# Patient Record
Sex: Male | Born: 1960
Health system: Southern US, Community
[De-identification: ages and names within clinical notes are randomized; demographics above are authoritative.]

## PROBLEM LIST (undated history)

## (undated) DIAGNOSIS — C7A00 Malignant carcinoid tumor of unspecified site: Secondary | ICD-10-CM

## (undated) DIAGNOSIS — C7A012 Malignant carcinoid tumor of the ileum: Secondary | ICD-10-CM

## (undated) DIAGNOSIS — Z87442 Personal history of urinary calculi: Secondary | ICD-10-CM

## (undated) DIAGNOSIS — M1990 Unspecified osteoarthritis, unspecified site: Secondary | ICD-10-CM

## (undated) DIAGNOSIS — C7B02 Secondary carcinoid tumors of liver: Secondary | ICD-10-CM

## (undated) HISTORY — PX: LYMPH NODE BIOPSY: SHX201

## (undated) HISTORY — DX: Malignant carcinoid tumor of unspecified site: C7A.00

## (undated) HISTORY — DX: Personal history of urinary calculi: Z87.442

## (undated) HISTORY — PX: UMBILICAL HERNIA REPAIR: SHX196

## (undated) HISTORY — DX: Malignant carcinoid tumor of the ileum: C7A.012

## (undated) HISTORY — DX: Secondary carcinoid tumors of liver: C7B.02

---

## 1994-04-29 DIAGNOSIS — Z8547 Personal history of malignant neoplasm of testis: Secondary | ICD-10-CM

## 1994-04-29 HISTORY — PX: ORCHIECTOMY: SHX2116

## 1994-04-29 HISTORY — DX: Personal history of malignant neoplasm of testis: Z85.47

## 2015-08-14 DIAGNOSIS — M79671 Pain in right foot: Secondary | ICD-10-CM | POA: Insufficient documentation

## 2015-08-14 DIAGNOSIS — R600 Localized edema: Secondary | ICD-10-CM | POA: Insufficient documentation

## 2015-08-14 DIAGNOSIS — M25474 Effusion, right foot: Secondary | ICD-10-CM | POA: Insufficient documentation

## 2018-04-29 DIAGNOSIS — Z8616 Personal history of COVID-19: Secondary | ICD-10-CM

## 2018-04-29 HISTORY — DX: Personal history of COVID-19: Z86.16

## 2018-11-26 DIAGNOSIS — U071 COVID-19: Secondary | ICD-10-CM | POA: Insufficient documentation

## 2018-11-27 DIAGNOSIS — J9601 Acute respiratory failure with hypoxia: Secondary | ICD-10-CM | POA: Insufficient documentation

## 2018-12-26 DIAGNOSIS — I82401 Acute embolism and thrombosis of unspecified deep veins of right lower extremity: Secondary | ICD-10-CM | POA: Insufficient documentation

## 2020-01-13 DIAGNOSIS — K909 Intestinal malabsorption, unspecified: Secondary | ICD-10-CM | POA: Insufficient documentation

## 2020-01-13 DIAGNOSIS — R933 Abnormal findings on diagnostic imaging of other parts of digestive tract: Secondary | ICD-10-CM | POA: Insufficient documentation

## 2020-01-13 DIAGNOSIS — R197 Diarrhea, unspecified: Secondary | ICD-10-CM | POA: Insufficient documentation

## 2020-01-28 HISTORY — PX: CYSTOTOMY: SHX926

## 2020-02-08 DIAGNOSIS — I2584 Coronary atherosclerosis due to calcified coronary lesion: Secondary | ICD-10-CM | POA: Insufficient documentation

## 2020-02-08 DIAGNOSIS — I2510 Atherosclerotic heart disease of native coronary artery without angina pectoris: Secondary | ICD-10-CM | POA: Insufficient documentation

## 2020-02-11 ENCOUNTER — Other Ambulatory Visit: Payer: Self-pay | Admitting: Oncology

## 2020-02-11 ENCOUNTER — Other Ambulatory Visit: Payer: Self-pay | Admitting: Hematology and Oncology

## 2020-02-11 DIAGNOSIS — C7B8 Other secondary neuroendocrine tumors: Secondary | ICD-10-CM | POA: Insufficient documentation

## 2020-02-11 DIAGNOSIS — C7B02 Secondary carcinoid tumors of liver: Secondary | ICD-10-CM

## 2020-02-11 DIAGNOSIS — C7A098 Malignant carcinoid tumors of other sites: Secondary | ICD-10-CM

## 2020-02-11 DIAGNOSIS — C7A00 Malignant carcinoid tumor of unspecified site: Secondary | ICD-10-CM | POA: Insufficient documentation

## 2020-02-11 LAB — CBC: RBC: 5.08 (ref 3.87–5.11)

## 2020-02-11 LAB — BASIC METABOLIC PANEL
BUN: 19 (ref 4–21)
CO2: 20 (ref 13–22)
Chloride: 109 — AB (ref 99–108)
Creatinine: 1.5 — AB (ref 0.6–1.3)
Glucose: 97
Potassium: 4.2 (ref 3.4–5.3)
Sodium: 141 (ref 137–147)

## 2020-02-11 LAB — COMPREHENSIVE METABOLIC PANEL
Albumin: 4.4 (ref 3.5–5.0)
Calcium: 9 (ref 8.7–10.7)

## 2020-02-11 LAB — CBC AND DIFFERENTIAL
HCT: 44 (ref 41–53)
Hemoglobin: 14.6 (ref 13.5–17.5)
Neutrophils Absolute: 5470
Platelets: 150 (ref 150–399)
WBC: 7.7

## 2020-02-11 LAB — HEPATIC FUNCTION PANEL
ALT: 31 (ref 10–40)
AST: 29 (ref 14–40)
Alkaline Phosphatase: 65 (ref 25–125)
Bilirubin, Total: 0.8

## 2020-02-14 ENCOUNTER — Encounter: Payer: Self-pay | Admitting: Pharmacist

## 2020-02-21 ENCOUNTER — Other Ambulatory Visit: Payer: Self-pay

## 2020-02-21 ENCOUNTER — Inpatient Hospital Stay: Payer: 59 | Attending: Oncology

## 2020-02-21 VITALS — BP 141/82 | HR 63 | Temp 97.9°F | Resp 20 | Ht 70.0 in | Wt 249.5 lb

## 2020-02-21 DIAGNOSIS — C7A00 Malignant carcinoid tumor of unspecified site: Secondary | ICD-10-CM

## 2020-02-21 DIAGNOSIS — E340 Carcinoid syndrome: Secondary | ICD-10-CM | POA: Diagnosis present

## 2020-02-21 DIAGNOSIS — C7B02 Secondary carcinoid tumors of liver: Secondary | ICD-10-CM | POA: Diagnosis present

## 2020-02-21 DIAGNOSIS — C7B8 Other secondary neuroendocrine tumors: Secondary | ICD-10-CM

## 2020-02-21 MED ORDER — OCTREOTIDE ACETATE 20 MG IM KIT
PACK | INTRAMUSCULAR | Status: AC
  Filled 2020-02-21: qty 1

## 2020-02-21 MED ORDER — OCTREOTIDE ACETATE 20 MG IM KIT
20.0000 mg | PACK | Freq: Once | INTRAMUSCULAR | Status: AC
  Administered 2020-02-21: 20 mg via INTRAMUSCULAR

## 2020-02-21 NOTE — Progress Notes (Signed)
PT STABLE AT TIME OF DISCHARGE 

## 2020-02-21 NOTE — Patient Instructions (Signed)
Octreotide injection solution What is this medicine? OCTREOTIDE (ok TREE oh tide) is used to reduce blood levels of growth hormone in patients with a condition called acromegaly. This medicine also reduces flushing and watery diarrhea caused by certain types of cancer. This medicine may be used for other purposes; ask your health care provider or pharmacist if you have questions. COMMON BRAND NAME(S): Bynfezia, Sandostatin What should I tell my health care provider before I take this medicine? They need to know if you have any of these conditions:  diabetes  gallbladder disease  kidney disease  liver disease  thyroid disease  an unusual or allergic reaction to octreotide, other medicines, foods, dyes, or preservatives  pregnant or trying to get pregnant  breast-feeding How should I use this medicine? This medicine is for injection under the skin or into a vein (only in emergency situations). It is usually given by a health care professional in a hospital or clinic setting. If you get this medicine at home, you will be taught how to prepare and give this medicine. Allow the injection solution to come to room temperature before use. Do not warm it artificially. Use exactly as directed. Take your medicine at regular intervals. Do not take your medicine more often than directed. It is important that you put your used needles and syringes in a special sharps container. Do not put them in a trash can. If you do not have a sharps container, call your pharmacist or healthcare provider to get one. Talk to your pediatrician regarding the use of this medicine in children. Special care may be needed. Overdosage: If you think you have taken too much of this medicine contact a poison control center or emergency room at once. NOTE: This medicine is only for you. Do not share this medicine with others. What if I miss a dose? If you miss a dose, take it as soon as you can. If it is almost time for your  next dose, take only that dose. Do not take double or extra doses. What may interact with this medicine?  bromocriptine  certain medicines for blood pressure, heart disease, irregular heartbeat  cyclosporine  diuretics  medicines for diabetes, including insulin  quinidine This list may not describe all possible interactions. Give your health care provider a list of all the medicines, herbs, non-prescription drugs, or dietary supplements you use. Also tell them if you smoke, drink alcohol, or use illegal drugs. Some items may interact with your medicine. What should I watch for while using this medicine? Visit your doctor or health care professional for regular checks on your progress. To help reduce irritation at the injection site, use a different site for each injection and make sure the solution is at room temperature before use. This medicine may cause decreases in blood sugar. Signs of low blood sugar include chills, cool, pale skin or cold sweats, drowsiness, extreme hunger, fast heartbeat, headache, nausea, nervousness or anxiety, shakiness, trembling, unsteadiness, tiredness, or weakness. Contact your doctor or health care professional right away if you experience any of these symptoms. This medicine may increase blood sugar. Ask your healthcare provider if changes in diet or medicines are needed if you have diabetes. This medicine may cause a decrease in vitamin B12. You should make sure that you get enough vitamin B12 while you are taking this medicine. Discuss the foods you eat and the vitamins you take with your health care professional. What side effects may I notice from receiving this medicine? Side   effects that you should report to your doctor or health care professional as soon as possible:  allergic reactions like skin rash, itching or hives, swelling of the face, lips, or tongue  fast, slow, or irregular heartbeat  right upper belly pain  severe stomach pain  signs  and symptoms of high blood sugar such as being more thirsty or hungry or having to urinate more than normal. You may also feel very tired or have blurry vision.  signs and symptoms of low blood sugar such as feeling anxious; confusion; dizziness; increased hunger; unusually weak or tired; increased sweating; shakiness; cold, clammy skin; irritable; headache; blurred vision; fast heartbeat; loss of consciousness  unusually weak or tired Side effects that usually do not require medical attention (report to your doctor or health care professional if they continue or are bothersome):  diarrhea  dizziness  gas  headache  nausea, vomiting  pain, redness, or irritation at site where injected  upset stomach This list may not describe all possible side effects. Call your doctor for medical advice about side effects. You may report side effects to FDA at 1-800-FDA-1088. Where should I keep my medicine? Keep out of the reach of children. Store in a refrigerator between 2 and 8 degrees C (36 and 46 degrees F). Protect from light. Allow to come to room temperature naturally. Do not use artificial heat. If protected from light, the injection may be stored at room temperature between 20 and 30 degrees C (70 and 86 degrees F) for 14 days. After the initial use, throw away any unused portion of a multiple dose vial after 14 days. Throw away unused portions of the ampules after use. NOTE: This sheet is a summary. It may not cover all possible information. If you have questions about this medicine, talk to your doctor, pharmacist, or health care provider.  2020 Elsevier/Gold Standard (2018-11-12 13:33:09)  

## 2020-02-23 NOTE — Progress Notes (Signed)
PT STABLE AT TIME OF DISCHARGE 

## 2020-03-07 ENCOUNTER — Other Ambulatory Visit: Payer: Self-pay | Admitting: Hematology and Oncology

## 2020-03-07 DIAGNOSIS — C7A00 Malignant carcinoid tumor of unspecified site: Secondary | ICD-10-CM

## 2020-03-15 NOTE — Addendum Note (Signed)
Addended by: Juanetta Beets on: 03/15/2020 04:02 PM   Modules accepted: Orders

## 2020-03-16 NOTE — Progress Notes (Signed)
[facility]  [address] [place],  [place]  [address] [phone]  Clinic Day:  03/17/2020  Referring physician: Derwood Kaplan, MD   This document serves as a record of services personally performed by Hosie Poisson, MD. It was created on their behalf by Curry,Lauren E, a trained medical scribe. The creation of this record is based on the scribe's personal observations and the provider's statements to them.   CHIEF COMPLAINT:  CC: Metastatic carcinoid  Current Treatment:  Octreotide injections   HISTORY OF PRESENT ILLNESS:  Glenn Bender is a 59 y.o. male with metastatic carcinoid tumor diagnosed in September 2021.  The patient started to experience hematuria, and was felt to have a kidney stone, and so CT imaging was pursued.  CT imaging on September 8th which showed multiple liver masses, the largest measuring 4.0 x 4.3 cm, a terminal ileum mass measuring 4.0 x 2.8 x 3.7 cm, and a mass of the mesentery of the small intestine measuring 3.9 x 2.3 x 4.0 cm.  He was therefore scheduled for diagnostic colonoscopy with Dr. Coralie Keens on September 22nd which revealed a large mucosal covered mass protruding through the ileocecal valve with peristalsis.  Biopsy was collected and surgical pathology was benign.  Liver biopsy from September 28th was pursued and surgical pathology from this procedure confirmed metastatic neuroendocrine tumor, grade 2, consistent with gastrointestinal primary.  Synaptophysin and chromogranin-A immunostains were positive as well as CDX-2 supporting gastrointestinal origin.  Ki67 was 6%.  He reports flushing of his face with alcohol and certain foods.  He notes loose bowels, but no melena or hematochezia.  He has approximately 4-5 bowel movements daily.     INTERVAL HISTORY:  Glenn Bender is here for routine follow up after starting treatment with octreotide injections on October 25th.  He tolerated this without significant difficulty  and is due for his 2nd dose on November 22nd.  He continues to have loose bowels, and flushing, but otherwise is well.  CT chest from November 1st revealed left paratracheal adenopathy and pleural nodularity along the right hemidiaphragm measuring up to 12 mm, worrisome for metastatic disease.  He has hepatic metastatic disease, better seen on 01/05/2020.  His  appetite is good, and he has gained 7 pounds since his last visit.  He denies fever, chills or other signs of infection.  He denies nausea, vomiting, bowel issues, or abdominal pain.  He denies sore throat, cough, dyspnea, or chest pain.  His wife Glenn Bender accompanies him today.   REVIEW OF SYSTEMS:  Review of Systems  Gastrointestinal: Positive for diarrhea.  Endocrine:       Occasional facial flushing  All other systems reviewed and are negative.    VITALS:  Blood pressure 133/85, pulse 64, temperature 98.1 F (36.7 C), temperature source Oral, resp. rate 18, height _0  (1.778 m), weight 252 lb (114.3 kg), SpO2 95 %.  Wt Readings from Last 3 Encounters:  03/17/20 252 lb (114.3 kg)  02/11/20 245 lb 3.2 oz (111.2 kg)  02/21/20 249 lb 8 oz (113.2 kg)    Body mass index is 36.16 kg/m.  Performance status (ECOG): 1 - Symptomatic but completely ambulatory  PHYSICAL EXAM:  Physical Exam Constitutional:      General: He is not in acute distress.    Appearance: Normal appearance. He is normal weight.  HENT:     Head: Normocephalic and atraumatic.  Eyes:     General: No scleral icterus.    Extraocular Movements:  Extraocular movements intact.     Conjunctiva/sclera: Conjunctivae normal.     Pupils: Pupils are equal, round, and reactive to light.  Cardiovascular:     Rate and Rhythm: Normal rate and regular rhythm.     Pulses: Normal pulses.     Heart sounds: Normal heart sounds. No murmur heard.  No friction rub. No gallop.   Pulmonary:     Effort: Pulmonary effort is normal. No respiratory distress.     Breath sounds: Normal  breath sounds.  Abdominal:     General: Bowel sounds are normal. There is no distension.     Palpations: Abdomen is soft. There is no mass.     Tenderness: There is no abdominal tenderness.     Comments: Firmness in the right upper quadrant.   Musculoskeletal:        General: Normal range of motion.     Cervical back: Normal range of motion and neck supple.     Right lower leg: No edema.     Left lower leg: No edema.  Lymphadenopathy:     Cervical: No cervical adenopathy.  Skin:    General: Skin is warm and dry.  Neurological:     General: No focal deficit present.     Mental Status: He is alert and oriented to person, place, and time. Mental status is at baseline.  Psychiatric:        Mood and Affect: Mood normal.        Behavior: Behavior normal.        Thought Content: Thought content normal.        Judgment: Judgment normal.    LABS:   CBC Latest Ref Rng & Units 02/11/2020  WBC - 7.7  Hemoglobin 13.5 - 17.5 14.6  Hematocrit 41 - 53 44  Platelets 150 - 399 150   CMP Latest Ref Rng & Units 02/11/2020  BUN 4 - 21 19  Creatinine 0.6 - 1.3 1.5(A)  Sodium 137 - 147 141  Potassium 3.4 - 5.3 4.2  Chloride 99 - 108 109(A)  CO2 13 - 22 20  Calcium 8.7 - 10.7 9.0  Alkaline Phos 25 - 125 65  AST 14 - 40 29  ALT 10 - 40 31     STUDIES:   He underwent CT chest with contrast on 02/28/2020 showing: 1. Left paratracheal adenopathy and pleural nodularity along the right hemidiaphragm are worrisome for metastatic disease. 2. Hepatic metastatic disease, better seen on 01/05/2020. 3. Hepatic steatosis. 4. Aortic atherosclerosis (ICD10-I70.0). Coronary artery calcification.   Allergies: No Known Allergies  Current Medications: Current Outpatient Medications  Medication Sig Dispense Refill  . Ascorbic Acid (VITAMIN C WITH ROSE HIPS) 500 MG tablet Take 500 mg by mouth daily.    . Biotin 10 MG CAPS Take by mouth.    . Cholecalciferol (VITAMIN D) 50 MCG (2000 UT) CAPS Take  by mouth.    Marland Kitchen OVER THE COUNTER MEDICATION Take 1 tablet by mouth daily. Beet root    . tamsulosin (FLOMAX) 0.4 MG CAPS capsule Take 0.4 mg by mouth.    . zinc sulfate 220 (50 Zn) MG capsule Take 220 mg by mouth daily.     No current facility-administered medications for this visit.     ASSESSMENT & PLAN:   Assessment:   1.  Recently diagnosed with metastatic carcinoid tumor consistent with gastrointestinal origin in September 2021.  He does have a terminal ileum mass measuring 4.0 x 2.8 x 3.7 cm, and a  mass of the mesentery of the small intestine measuring 3.9 x 2.3 x 4.0 cm.  As this has already metastasized, surgical resection is not indicated.  24 hour urine for 5 HIAA was quite elevated at 86.8, confirming carcinoid syndrome.  He started treatment with octreotide injections on October 25th and tolerated this without difficulty.  2.  Multiple liver masses, the largest measuring 4.0 x 4.3 cm, consistent with metastatic carcinoid.  He does also have carcinoid syndrome.  3.  History of testicular cancer diagnosed in 1996, treated with orchiectomy and chemotherapy.   4.  Nonobstructive calculi in the left renal collecting system measuring up to 2.0 x 0.6 x 1.0 cm, in the left renal pelvis.    5.  Loose bowels, secondary to carcinoid malignancy.  6.  Mild renal insufficiency, hopefully just related to his nephrolithiasis.   Plan: He started treatment with octreotide injections on October 25th and tolerated this without difficulty.  As his disease is not curative, I again explained that we would continue with the injections indefinitely, unless we see progression of disease.  He will proceed with his 2nd dose on November 22nd.  We will see him back in 4 weeks with CBC, CMP and chromagranin A prior to a 3rd dose.  We will plan CT imaging after he has received 3 doses.  He and his wife understand and agree to this plan of care.  I have answered their questions and they know to call with any  concerns.   I provided 20 minutes of face-to-face time during this this encounter and > 50% was spent counseling as documented under my assessment and plan.    Derwood Kaplan, MD [facility] AT [facility] [address] [place] [address] Dept: [phone] Dept Fax: [phone]   I, Rita Ohara, am acting as scribe for Derwood Kaplan, MD  I have reviewed this report as typed by the medical scribe, and it is complete and accurate.

## 2020-03-17 ENCOUNTER — Encounter: Payer: Self-pay | Admitting: Oncology

## 2020-03-17 ENCOUNTER — Inpatient Hospital Stay (INDEPENDENT_AMBULATORY_CARE_PROVIDER_SITE_OTHER): Payer: 59 | Admitting: Oncology

## 2020-03-17 ENCOUNTER — Inpatient Hospital Stay: Payer: 59 | Attending: Oncology

## 2020-03-17 ENCOUNTER — Other Ambulatory Visit: Payer: Self-pay

## 2020-03-17 ENCOUNTER — Other Ambulatory Visit: Payer: Self-pay | Admitting: Oncology

## 2020-03-17 VITALS — BP 133/85 | HR 64 | Temp 98.1°F | Resp 18 | Ht 70.0 in | Wt 252.0 lb

## 2020-03-17 DIAGNOSIS — Z79899 Other long term (current) drug therapy: Secondary | ICD-10-CM

## 2020-03-17 DIAGNOSIS — C7A00 Malignant carcinoid tumor of unspecified site: Secondary | ICD-10-CM

## 2020-03-17 DIAGNOSIS — R160 Hepatomegaly, not elsewhere classified: Secondary | ICD-10-CM

## 2020-03-17 DIAGNOSIS — C7B02 Secondary carcinoid tumors of liver: Secondary | ICD-10-CM | POA: Insufficient documentation

## 2020-03-17 DIAGNOSIS — Z9079 Acquired absence of other genital organ(s): Secondary | ICD-10-CM

## 2020-03-17 DIAGNOSIS — N200 Calculus of kidney: Secondary | ICD-10-CM

## 2020-03-17 DIAGNOSIS — E340 Carcinoid syndrome: Secondary | ICD-10-CM | POA: Insufficient documentation

## 2020-03-17 DIAGNOSIS — Z8547 Personal history of malignant neoplasm of testis: Secondary | ICD-10-CM

## 2020-03-17 DIAGNOSIS — R198 Other specified symptoms and signs involving the digestive system and abdomen: Secondary | ICD-10-CM | POA: Diagnosis not present

## 2020-03-17 DIAGNOSIS — N289 Disorder of kidney and ureter, unspecified: Secondary | ICD-10-CM

## 2020-03-17 LAB — CMP (CANCER CENTER ONLY)
ALT: 23 U/L (ref 0–44)
AST: 19 U/L (ref 15–41)
Albumin: 4.1 g/dL (ref 3.5–5.0)
Alkaline Phosphatase: 50 U/L (ref 38–126)
Anion gap: 8 (ref 5–15)
BUN: 11 mg/dL (ref 8–23)
CO2: 25 mmol/L (ref 22–32)
Calcium: 8.4 mg/dL — ABNORMAL LOW (ref 8.9–10.3)
Chloride: 107 mmol/L (ref 98–111)
Creatinine: 1.46 mg/dL — ABNORMAL HIGH (ref 0.61–1.24)
GFR, Estimated: 54 mL/min — ABNORMAL LOW (ref >60)
Glucose, Bld: 142 mg/dL — ABNORMAL HIGH (ref 70–99)
Potassium: 3.6 mmol/L (ref 3.5–5.1)
Sodium: 140 mmol/L (ref 135–145)
Total Bilirubin: 0.6 mg/dL (ref 0.3–1.2)
Total Protein: 6.3 g/dL — ABNORMAL LOW (ref 6.5–8.1)

## 2020-03-17 LAB — CBC WITH DIFFERENTIAL (CANCER CENTER ONLY)
Abs Immature Granulocytes: 0.39 10*3/uL — ABNORMAL HIGH (ref 0.00–0.07)
Basophils Absolute: 0.1 10*3/uL (ref 0.0–0.1)
Basophils Relative: 1 %
Eosinophils Absolute: 0.3 10*3/uL (ref 0.0–0.5)
Eosinophils Relative: 3 %
HCT: 41 % (ref 39.0–52.0)
Hemoglobin: 13.5 g/dL (ref 13.0–17.0)
Immature Granulocytes: 4 %
Lymphocytes Relative: 12 %
Lymphs Abs: 1.2 10*3/uL (ref 0.7–4.0)
MCH: 29.2 pg (ref 26.0–34.0)
MCHC: 32.9 g/dL (ref 30.0–36.0)
MCV: 88.7 fL (ref 80.0–100.0)
Monocytes Absolute: 0.6 10*3/uL (ref 0.1–1.0)
Monocytes Relative: 6 %
Neutro Abs: 7.6 10*3/uL (ref 1.7–7.7)
Neutrophils Relative %: 74 %
Platelet Count: 171 10*3/uL (ref 150–400)
RBC: 4.62 MIL/uL (ref 4.22–5.81)
RDW: 13.2 % (ref 11.5–15.5)
WBC Count: 10.3 10*3/uL (ref 4.0–10.5)
nRBC: 0 % (ref 0.0–0.2)

## 2020-03-20 ENCOUNTER — Other Ambulatory Visit: Payer: Self-pay

## 2020-03-20 ENCOUNTER — Inpatient Hospital Stay: Payer: 59

## 2020-03-20 VITALS — BP 136/79 | HR 53 | Temp 98.0°F | Resp 18 | Ht 70.0 in | Wt 251.2 lb

## 2020-03-20 DIAGNOSIS — E340 Carcinoid syndrome: Secondary | ICD-10-CM | POA: Diagnosis not present

## 2020-03-20 DIAGNOSIS — C7A00 Malignant carcinoid tumor of unspecified site: Secondary | ICD-10-CM

## 2020-03-20 DIAGNOSIS — C7B8 Other secondary neuroendocrine tumors: Secondary | ICD-10-CM

## 2020-03-20 MED ORDER — OCTREOTIDE ACETATE 20 MG IM KIT
PACK | INTRAMUSCULAR | Status: AC
  Filled 2020-03-20: qty 1

## 2020-03-20 MED ORDER — OCTREOTIDE ACETATE 20 MG IM KIT
20.0000 mg | PACK | Freq: Once | INTRAMUSCULAR | Status: AC
  Administered 2020-03-20: 20 mg via INTRAMUSCULAR

## 2020-03-20 NOTE — Patient Instructions (Signed)
Octreotide injection solution What is this medicine? OCTREOTIDE (ok TREE oh tide) is used to reduce blood levels of growth hormone in patients with a condition called acromegaly. This medicine also reduces flushing and watery diarrhea caused by certain types of cancer. This medicine may be used for other purposes; ask your health care provider or pharmacist if you have questions. COMMON BRAND NAME(S): Bynfezia, Sandostatin What should I tell my health care provider before I take this medicine? They need to know if you have any of these conditions:  diabetes  gallbladder disease  kidney disease  liver disease  thyroid disease  an unusual or allergic reaction to octreotide, other medicines, foods, dyes, or preservatives  pregnant or trying to get pregnant  breast-feeding How should I use this medicine? This medicine is for injection under the skin or into a vein (only in emergency situations). It is usually given by a health care professional in a hospital or clinic setting. If you get this medicine at home, you will be taught how to prepare and give this medicine. Allow the injection solution to come to room temperature before use. Do not warm it artificially. Use exactly as directed. Take your medicine at regular intervals. Do not take your medicine more often than directed. It is important that you put your used needles and syringes in a special sharps container. Do not put them in a trash can. If you do not have a sharps container, call your pharmacist or healthcare provider to get one. Talk to your pediatrician regarding the use of this medicine in children. Special care may be needed. Overdosage: If you think you have taken too much of this medicine contact a poison control center or emergency room at once. NOTE: This medicine is only for you. Do not share this medicine with others. What if I miss a dose? If you miss a dose, take it as soon as you can. If it is almost time for your  next dose, take only that dose. Do not take double or extra doses. What may interact with this medicine?  bromocriptine  certain medicines for blood pressure, heart disease, irregular heartbeat  cyclosporine  diuretics  medicines for diabetes, including insulin  quinidine This list may not describe all possible interactions. Give your health care provider a list of all the medicines, herbs, non-prescription drugs, or dietary supplements you use. Also tell them if you smoke, drink alcohol, or use illegal drugs. Some items may interact with your medicine. What should I watch for while using this medicine? Visit your doctor or health care professional for regular checks on your progress. To help reduce irritation at the injection site, use a different site for each injection and make sure the solution is at room temperature before use. This medicine may cause decreases in blood sugar. Signs of low blood sugar include chills, cool, pale skin or cold sweats, drowsiness, extreme hunger, fast heartbeat, headache, nausea, nervousness or anxiety, shakiness, trembling, unsteadiness, tiredness, or weakness. Contact your doctor or health care professional right away if you experience any of these symptoms. This medicine may increase blood sugar. Ask your healthcare provider if changes in diet or medicines are needed if you have diabetes. This medicine may cause a decrease in vitamin B12. You should make sure that you get enough vitamin B12 while you are taking this medicine. Discuss the foods you eat and the vitamins you take with your health care professional. What side effects may I notice from receiving this medicine? Side   effects that you should report to your doctor or health care professional as soon as possible:  allergic reactions like skin rash, itching or hives, swelling of the face, lips, or tongue  fast, slow, or irregular heartbeat  right upper belly pain  severe stomach pain  signs  and symptoms of high blood sugar such as being more thirsty or hungry or having to urinate more than normal. You may also feel very tired or have blurry vision.  signs and symptoms of low blood sugar such as feeling anxious; confusion; dizziness; increased hunger; unusually weak or tired; increased sweating; shakiness; cold, clammy skin; irritable; headache; blurred vision; fast heartbeat; loss of consciousness  unusually weak or tired Side effects that usually do not require medical attention (report to your doctor or health care professional if they continue or are bothersome):  diarrhea  dizziness  gas  headache  nausea, vomiting  pain, redness, or irritation at site where injected  upset stomach This list may not describe all possible side effects. Call your doctor for medical advice about side effects. You may report side effects to FDA at 1-800-FDA-1088. Where should I keep my medicine? Keep out of the reach of children. Store in a refrigerator between 2 and 8 degrees C (36 and 46 degrees F). Protect from light. Allow to come to room temperature naturally. Do not use artificial heat. If protected from light, the injection may be stored at room temperature between 20 and 30 degrees C (70 and 86 degrees F) for 14 days. After the initial use, throw away any unused portion of a multiple dose vial after 14 days. Throw away unused portions of the ampules after use. NOTE: This sheet is a summary. It may not cover all possible information. If you have questions about this medicine, talk to your doctor, pharmacist, or health care provider.  2020 Elsevier/Gold Standard (2018-11-12 13:33:09)  

## 2020-03-20 NOTE — Progress Notes (Signed)
PT STABLE AT TIME OF DISCHARGE 

## 2020-04-07 ENCOUNTER — Telehealth: Payer: Self-pay

## 2020-04-07 NOTE — Telephone Encounter (Signed)
Pt's wife called to make sure it was ok for pt to receive his COVID booster on Monday.  I told her that you recommend your pt's to take it.

## 2020-04-11 NOTE — Progress Notes (Signed)
PT STABLE AT TIME OF DISCHARGE 

## 2020-04-12 ENCOUNTER — Encounter: Payer: Self-pay | Admitting: Oncology

## 2020-04-14 ENCOUNTER — Encounter: Payer: Self-pay | Admitting: Hematology and Oncology

## 2020-04-14 ENCOUNTER — Other Ambulatory Visit: Payer: Self-pay

## 2020-04-14 ENCOUNTER — Inpatient Hospital Stay: Payer: 59 | Attending: Oncology

## 2020-04-14 ENCOUNTER — Other Ambulatory Visit: Payer: Self-pay | Admitting: Hematology and Oncology

## 2020-04-14 ENCOUNTER — Telehealth: Payer: Self-pay | Admitting: Oncology

## 2020-04-14 ENCOUNTER — Inpatient Hospital Stay (INDEPENDENT_AMBULATORY_CARE_PROVIDER_SITE_OTHER): Payer: 59 | Admitting: Hematology and Oncology

## 2020-04-14 VITALS — BP 146/84 | HR 74 | Temp 98.3°F | Resp 18 | Ht 70.0 in | Wt 242.4 lb

## 2020-04-14 DIAGNOSIS — C7B09 Secondary carcinoid tumors of other sites: Secondary | ICD-10-CM | POA: Diagnosis not present

## 2020-04-14 DIAGNOSIS — C7A00 Malignant carcinoid tumor of unspecified site: Secondary | ICD-10-CM

## 2020-04-14 DIAGNOSIS — E340 Carcinoid syndrome: Secondary | ICD-10-CM | POA: Insufficient documentation

## 2020-04-14 DIAGNOSIS — C7B8 Other secondary neuroendocrine tumors: Secondary | ICD-10-CM | POA: Diagnosis not present

## 2020-04-14 DIAGNOSIS — Z23 Encounter for immunization: Secondary | ICD-10-CM

## 2020-04-14 DIAGNOSIS — C7B02 Secondary carcinoid tumors of liver: Secondary | ICD-10-CM | POA: Insufficient documentation

## 2020-04-14 LAB — HEPATIC FUNCTION PANEL
ALT: 41 — AB (ref 10–40)
AST: 31 (ref 14–40)
Alkaline Phosphatase: 54 (ref 25–125)
Bilirubin, Total: 0.8

## 2020-04-14 LAB — BASIC METABOLIC PANEL
BUN: 13 (ref 4–21)
CO2: 20 (ref 13–22)
Chloride: 103 (ref 99–108)
Creatinine: 1.5 — AB (ref 0.6–1.3)
Glucose: 146
Potassium: 3.7 (ref 3.4–5.3)
Sodium: 138 (ref 137–147)

## 2020-04-14 LAB — CBC AND DIFFERENTIAL
HCT: 42 (ref 41–53)
Hemoglobin: 13.9 (ref 13.5–17.5)
Neutrophils Absolute: 9.86
Platelets: 157 (ref 150–399)
WBC: 11.6

## 2020-04-14 LAB — COMPREHENSIVE METABOLIC PANEL
Albumin: 3.8 (ref 3.5–5.0)
Calcium: 8.5 — AB (ref 8.7–10.7)

## 2020-04-14 LAB — CBC: RBC: 4.8 (ref 3.87–5.11)

## 2020-04-14 NOTE — Progress Notes (Signed)
Glenn Bender  [address] [place],  [place]  [address] [phone]  Clinic Day:  04/18/2020  Referring physician: Jeanie Sewer, NP    CHIEF COMPLAINT:  CC: Metastatic carcinoid  Current Treatment:  Octreotide injections   HISTORY OF PRESENT ILLNESS:  Glenn Bender is a 59 y.o. male with metastatic carcinoid tumor diagnosed in September 2021.  The patient started to experience hematuria, and was felt to have a kidney stone, and so CT imaging was pursued.  CT imaging on September 8th which showed multiple liver masses, the largest measuring 4.0 x 4.3 cm, a terminal ileum mass measuring 4.0 x 2.8 x 3.7 cm, and a mass of the mesentery of the small intestine measuring 3.9 x 2.3 x 4.0 cm.  He was therefore scheduled for diagnostic colonoscopy with Dr. Coralie Keens on September 22nd which revealed a large mucosal covered mass protruding through the ileocecal valve with peristalsis.  Biopsy was collected and surgical pathology was benign.  Liver biopsy from September 28th was pursued and surgical pathology from this procedure confirmed metastatic neuroendocrine tumor, grade 2, consistent with gastrointestinal primary.  Synaptophysin and chromogranin-A immunostains were positive as well as CDX-2 supporting gastrointestinal origin.  Ki67 was 6%.  He reports flushing of his face with alcohol and certain foods.  He reported diarrhea, as well as flushing.  24 hour urine for 5HIAA was also elevated at 86.8, which is consistent with carcinoid.  Chromogranin a was elevated at 214 in October.  CT chest from November 1st revealed left paratracheal adenopathy and pleural nodularity along the right hemidiaphragm measuring up to 12 mm, worrisome for metastatic disease.  CT chest from November 1st revealed left paratracheal adenopathy and pleural nodularity along the right hemidiaphragm measuring up to 12 mm, worrisome for metastatic disease.      INTERVAL HISTORY:  [name] is here  for routine follow up prior to his next octreotide.  He continues to have diarrhea with 7 to 8 stools a day, although it is slightly more formed than previously.  He otherwise denies complaints.  He reports occasional chills, but denies fevers. He denies sore throat or cough.  He denies any urinary symptoms.  His appetite is good.  His weight is 242.4 lb, which is a decrease of 3 lb. He is hoping to have his large kidney stone removed next month.  His wife, Glenn Bender, accompanies him today.   REVIEW OF SYSTEMS:  Review of Systems  Constitutional: Positive for chills. Negative for appetite change, fatigue, fever and unexpected weight change.  HENT:   Negative for lump/mass, mouth sores and sore throat.   Respiratory: Negative for cough and shortness of breath.   Cardiovascular: Negative for chest pain and leg swelling.  Gastrointestinal: Positive for diarrhea. Negative for abdominal pain, constipation, nausea and vomiting.  Genitourinary: Negative for difficulty urinating, dysuria, frequency and hematuria.   Musculoskeletal: Negative for arthralgias, back pain and myalgias.  Skin: Negative for rash.  Neurological: Negative for dizziness and headaches.  Psychiatric/Behavioral: Negative for depression. The patient is not nervous/anxious.      VITALS:  Blood pressure (!) 146/84, pulse 74, temperature 98.3 F (36.8 C), temperature source Oral, resp. rate 18, height _0  (1.778 m), weight 242 lb 6.4 oz (110 kg), SpO2 96 %.  Wt Readings from Last 3 Encounters:  04/17/20 236 lb 2 oz (107.1 kg)  04/14/20 242 lb 6.4 oz (110 kg)  02/11/20 245 lb 2 oz (111.2 kg)    Body mass index is  34.78 kg/m.  Performance status (ECOG): 1 - Symptomatic but completely ambulatory  PHYSICAL EXAM:  Physical Exam Vitals and nursing note reviewed.  Constitutional:      General: He is not in acute distress.    Appearance: Normal appearance.  HENT:     Mouth/Throat:     Mouth: Mucous membranes are moist.      Pharynx: Oropharynx is clear. No oropharyngeal exudate or posterior oropharyngeal erythema.  Eyes:     General: No scleral icterus.    Extraocular Movements: Extraocular movements intact.     Conjunctiva/sclera: Conjunctivae normal.  Cardiovascular:     Rate and Rhythm: Normal rate and regular rhythm.     Heart sounds: Normal heart sounds. No murmur heard. No friction rub. No gallop.   Pulmonary:     Effort: No respiratory distress.     Breath sounds: Normal breath sounds. No stridor. No wheezing, rhonchi or rales.  Chest:  Breasts:     Right: No axillary adenopathy or supraclavicular adenopathy.     Left: No axillary adenopathy or supraclavicular adenopathy.    Abdominal:     General: There is no distension.     Palpations: Abdomen is soft. There is no hepatomegaly, splenomegaly or mass.     Tenderness: There is no abdominal tenderness. There is no guarding.     Hernia: No hernia is present.  Musculoskeletal:     Cervical back: Neck supple. No tenderness.     Right lower leg: No edema.     Left lower leg: No edema.  Lymphadenopathy:     Cervical: No cervical adenopathy.     Upper Body:     Right upper body: No supraclavicular or axillary adenopathy.     Left upper body: No supraclavicular or axillary adenopathy.     Lower Body: No right inguinal adenopathy. No left inguinal adenopathy.  Skin:    Coloration: Skin is not jaundiced.     Findings: No rash.  Neurological:     Mental Status: He is alert and oriented to person, place, and time.     Cranial Nerves: No cranial nerve deficit.  Psychiatric:        Mood and Affect: Mood normal.        Behavior: Behavior normal.    LABS:   CBC Latest Ref Rng & Units 04/14/2020 03/17/2020 02/11/2020  WBC - 11.6 10.3 7.7  Hemoglobin 13.5 - 17.5 13.9 13.5 14.6  Hematocrit 41 - 53 42 41.0 44  Platelets 150 - 399 157 171 150   CMP Latest Ref Rng & Units 04/14/2020 03/17/2020 02/11/2020  Glucose 70 - 99 mg/dL - 142(H) -  BUN 4 -  _0 Creatinine 0.6 - 1.3 1.5(A) 1.46(H) 1.5(A)  Sodium 137 - 147 138 140 141  Potassium 3.4 - 5.3 3.7 3.6 4.2  Chloride 99 - 108 103 107 109(A)  CO2 13 - _1 Calcium 8.7 - 10.7 8.5(A) 8.4(L) 9.0  Total Protein 6.5 - 8.1 g/dL - 6.3(L) -  Total Bilirubin 0.3 - 1.2 mg/dL - 0.6 -  Alkaline Phos 25 - 125 54 50 65  AST 14 - 40 _2 ALT 10 - 40 41(A) 23 31   Chromogranin A is pending.  STUDIES:  None   Allergies: No Known Allergies  Current Medications: Current Outpatient Medications  Medication Sig Dispense Refill  . octreotide (SANDOSTATIN LAR) 20 MG injection Inject 20 mg into the muscle every 28 (twenty-eight) days.    Marland Kitchen  Ascorbic Acid (VITAMIN C WITH ROSE HIPS) 500 MG tablet Take 500 mg by mouth daily.    . Biotin 10 MG CAPS Take by mouth.    . Cholecalciferol (VITAMIN D) 50 MCG (2000 UT) CAPS Take by mouth.    Marland Kitchen OVER THE COUNTER MEDICATION Take 1 tablet by mouth daily. Beet root    . zinc sulfate 220 (50 Zn) MG capsule Take 220 mg by mouth daily.     No current facility-administered medications for this visit.     ASSESSMENT & PLAN:   Assessment:   1. 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  He does have a terminal ileum mass measuring 4.0 x 2.8 x 3.7 cm, and a mass of the mesentery of the small intestine measuring 3.9 x 2.3 x 4.0 cm.  As this has already metastasized, surgical resection is not indicated.  24 hour urine for 5 HIAA was quite elevated at 86.8, confirming carcinoid syndrome.  Chromogranin A was also elevated at 214. He started treatment with octreotide injections on October 25th and continues to tolerate this without difficulty.  Due to the persistent diarrhea, we would recommend increasing the octreotide to 30 mg every 4 weeks. 2. Multiple liver masses, the largest measuring 4.0 x 4.3 cm, consistent with metastatic carcinoid with carcinoid syndrome. 3. History of testicular cancer diagnosed in  1996, treated with orchiectomy and chemotherapy.  4. Nonobstructive calculi in the left renal collecting system measuring up to 2.0 x 0.6 x 1.0 cm, in the left renal pelvis. He is being scheduled for removal of this next month.  5. Diarrhea, secondary to carcinoid malignancy.  As above, due to the persistent diarrhea we will increase the octreotide to 30 mg 6. Mild renal insufficiency, which is stable and may be related to the nephrolithiasis.  7. Mild leukocytosis of uncertain etiology.  He does not have signs or symptoms of infection.   Plan: He will proceed with octreotide 30 mg on December 20th.  I will also give him his flu vaccine that day. We will plan to see him back in 4 weeks with CBC, CMP and chromagranin A, as well as CT chest, abdomen and pelvis to re-assess his disease baseline prior to a 4th dose of octreotide.   The patient and his wife understand the plans discussed today and are in agreement with them.  They know to contact our office if he develops signs or symptoms of infection or other issues requiring immediate clinical assessment.   Marvia Pickles, PA-C [facility] AT [facility] [address] [place] [address] Dept: [phone] Dept Fax: [phone]

## 2020-04-14 NOTE — Telephone Encounter (Signed)
Per 12/17 LOS, patient scheduled for 05/12/2020 Labs, CT Scans, 05/15/2020 Follow UP, 05/16/2020 Sandostatin LAR Inj - Patient give Physician Orders/Appt Summary

## 2020-04-17 ENCOUNTER — Inpatient Hospital Stay: Payer: 59

## 2020-04-17 ENCOUNTER — Other Ambulatory Visit: Payer: Self-pay

## 2020-04-17 VITALS — BP 122/70 | HR 72 | Temp 98.5°F | Resp 18 | Ht 70.0 in | Wt 236.1 lb

## 2020-04-17 DIAGNOSIS — Z23 Encounter for immunization: Secondary | ICD-10-CM

## 2020-04-17 DIAGNOSIS — E340 Carcinoid syndrome: Secondary | ICD-10-CM | POA: Diagnosis present

## 2020-04-17 DIAGNOSIS — C7B8 Other secondary neuroendocrine tumors: Secondary | ICD-10-CM

## 2020-04-17 DIAGNOSIS — C7A00 Malignant carcinoid tumor of unspecified site: Secondary | ICD-10-CM

## 2020-04-17 DIAGNOSIS — C7B02 Secondary carcinoid tumors of liver: Secondary | ICD-10-CM | POA: Diagnosis present

## 2020-04-17 MED ORDER — INFLUENZA VAC SPLIT QUAD 0.5 ML IM SUSY
PREFILLED_SYRINGE | INTRAMUSCULAR | Status: AC
  Filled 2020-04-17: qty 0.5

## 2020-04-17 MED ORDER — OCTREOTIDE ACETATE 30 MG IM KIT
PACK | INTRAMUSCULAR | Status: AC
  Filled 2020-04-17: qty 1

## 2020-04-17 MED ORDER — OCTREOTIDE ACETATE 30 MG IM KIT
30.0000 mg | PACK | Freq: Once | INTRAMUSCULAR | Status: AC
  Administered 2020-04-17: 30 mg via INTRAMUSCULAR

## 2020-04-17 MED ORDER — INFLUENZA VAC SPLIT QUAD 0.5 ML IM SUSY
0.5000 mL | PREFILLED_SYRINGE | Freq: Once | INTRAMUSCULAR | Status: AC
  Administered 2020-04-17: 0.5 mL via INTRAMUSCULAR
  Filled 2020-04-17: qty 0.5

## 2020-04-17 NOTE — Patient Instructions (Signed)

## 2020-04-17 NOTE — Progress Notes (Signed)
PT STABLE AT TIME OF DISCHARGE 

## 2020-04-18 ENCOUNTER — Other Ambulatory Visit: Payer: Self-pay | Admitting: Urology

## 2020-04-19 ENCOUNTER — Other Ambulatory Visit (HOSPITAL_COMMUNITY): Payer: Self-pay | Admitting: Urology

## 2020-04-19 DIAGNOSIS — N200 Calculus of kidney: Secondary | ICD-10-CM

## 2020-04-25 ENCOUNTER — Encounter: Payer: Self-pay | Admitting: Hematology and Oncology

## 2020-05-02 ENCOUNTER — Telehealth: Payer: Self-pay | Admitting: Oncology

## 2020-05-02 NOTE — Telephone Encounter (Signed)
Pt LVM on triage line @ 1440, req a call Back. I called @ 1512, no answer & voicemail box full.

## 2020-05-02 NOTE — Telephone Encounter (Signed)
05/02/20 spoke with patient and reschedule all appts

## 2020-05-03 ENCOUNTER — Telehealth: Payer: Self-pay | Admitting: Oncology

## 2020-05-03 ENCOUNTER — Telehealth: Payer: Self-pay

## 2020-05-03 NOTE — Telephone Encounter (Signed)
05/03/20 spoke with patient and r/s ct scans to 05/30/20@1pm 

## 2020-05-03 NOTE — Telephone Encounter (Signed)
Pt's wife, Zella Ball, called to make sure his CT scan appts were going to be moved to Feb 2022 as well?

## 2020-05-09 NOTE — Patient Instructions (Signed)
DUE TO COVID-19 ONLY ONE VISITOR IS ALLOWED TO COME WITH YOU AND STAY IN THE WAITING ROOM ONLY DURING PRE OP AND PROCEDURE DAY OF SURGERY. THE 1 VISITOR  MAY VISIT WITH YOU AFTER SURGERY IN YOUR PRIVATE ROOM DURING VISITING HOURS ONLY!  YOU NEED TO HAVE A COVID 19 TEST ON_1/14______ @_______ , THIS TEST MUST BE DONE BEFORE SURGERY,  COVID TESTING SITE [address] [place] [address], IT IS ON THE RIGHT GOING OUT [address] APPROXIMATELY  2 MINUTES PAST [employer] ON THE RIGHT. ONCE YOUR COVID TEST IS COMPLETED,  PLEASE BEGIN THE QUARANTINE INSTRUCTIONS AS OUTLINED IN YOUR HANDOUT.                Daylyn Azbill    Your procedure is scheduled on: 05/16/20   Report to [facility] Main  Entrance   Report to admitting at  8:00 AM     Call this number if you have problems the morning of surgery [phone]    Remember: Do not eat food or drink liquids :After Midnight  . BRUSH YOUR TEETH MORNING OF SURGERY AND RINSE YOUR MOUTH OUT, NO CHEWING GUM CANDY OR MINTS.     Take these medicines the morning of surgery with A SIP OF WATER: none                               You may not have any metal on your body including              piercings  Do not wear jewelry,  lotions, powders or deodor              Men may shave face and neck.   Do not bring valuables to the hospital. [place].  Contacts, dentures or bridgework may not be worn into surgery.      Patients discharged the day of surgery will not be allowed to drive home.   IF YOU ARE HAVING SURGERY AND GOING HOME THE SAME DAY, YOU MUST HAVE AN ADULT TO DRIVE YOU HOME AND BE WITH YOU FOR 24 HOURS.   YOU MAY GO HOME BY TAXI OR UBER OR ORTHERWISE, BUT AN ADULT MUST ACCOMPANY YOU HOME AND STAY WITH YOU FOR 24 HOURS.  Name and phone number of your driver:  Special Instructions: N/A              Please read over the following fact sheets you were  given: _____________________________________________________________________             [facility] - Preparing for Surgery Before surgery, you can play an important role.   Because skin is not sterile, your skin needs to be as free of germs as possible .  You can reduce the number of germs on your skin by washing with CHG (chlorahexidine gluconate) soap before surgery .  CHG is an antiseptic cleaner which kills germs and bonds with the skin to continue killing germs even after washing. Please DO NOT use if you have an allergy to CHG or antibacterial soaps .  If your skin becomes reddened/irritated stop using the CHG and inform your nurse when you arrive at Short Stay.   You may shave your face/neck.  Please follow these instructions carefully:  1.  Shower with CHG Soap the night before  surgery and the  morning of Surgery.  2.  If you choose to wash your hair, wash your hair first as usual with your  normal  shampoo.  3.  After you shampoo, rinse your hair and body thoroughly to remove the  shampoo.                                        4.  Use CHG as you would any other liquid soap.  You can apply chg directly  to the skin and wash                       Gently with a scrungie or clean washcloth.  5.  Apply the CHG Soap to your body ONLY FROM THE NECK DOWN.   Do not use on face/ open                           Wound or open sores. Avoid contact with eyes, ears mouth and genitals (private parts).                       Wash face,  Genitals (private parts) with your normal soap.             6.  Wash thoroughly, paying special attention to the area where your surgery  will be performed.  7.  Thoroughly rinse your body with warm water from the neck down.  8.  DO NOT shower/wash with your normal soap after using and rinsing off  the CHG Soap.             9.  Pat yourself dry with a clean towel.            10.  Wear clean pajamas.            11.  Place clean sheets on your bed the night of  your first shower and do not  sleep with pets. Day of Surgery : Do not apply any lotions/deodorants the morning of surgery.  Please wear clean clothes to the hospital/surgery center.  FAILURE TO FOLLOW THESE INSTRUCTIONS MAY RESULT IN THE CANCELLATION OF YOUR SURGERY PATIENT SIGNATURE_________________________________  NURSE SIGNATURE__________________________________  ________________________________________________________________________

## 2020-05-10 ENCOUNTER — Encounter (HOSPITAL_COMMUNITY)
Admission: RE | Admit: 2020-05-10 | Discharge: 2020-05-10 | Disposition: A | Discharge status: Home / Self Care | Payer: 59 | Source: Ambulatory Visit | Attending: Family | Admitting: Family

## 2020-05-15 ENCOUNTER — Ambulatory Visit: Payer: 59 | Admitting: Oncology

## 2020-05-16 ENCOUNTER — Other Ambulatory Visit (HOSPITAL_COMMUNITY): Payer: 59

## 2020-05-16 ENCOUNTER — Ambulatory Visit (HOSPITAL_COMMUNITY): Payer: 59

## 2020-05-16 ENCOUNTER — Ambulatory Visit: Payer: 59

## 2020-05-23 ENCOUNTER — Other Ambulatory Visit: Payer: Self-pay | Admitting: Pharmacist

## 2020-05-29 ENCOUNTER — Encounter: Payer: Self-pay | Admitting: Hematology and Oncology

## 2020-05-30 ENCOUNTER — Encounter: Payer: Self-pay | Admitting: Hematology and Oncology

## 2020-05-30 LAB — CBC AND DIFFERENTIAL
HCT: 41 (ref 41–53)
Hemoglobin: 13.8 (ref 13.5–17.5)
Neutrophils Absolute: 4.31
Platelets: 160 (ref 150–399)
WBC: 5.9

## 2020-05-30 LAB — CBC: RBC: 4.76 (ref 3.87–5.11)

## 2020-05-30 LAB — COMPREHENSIVE METABOLIC PANEL
Albumin: 4.2 (ref 3.5–5.0)
Calcium: 8.9 (ref 8.7–10.7)

## 2020-05-30 LAB — BASIC METABOLIC PANEL
BUN: 10 (ref 4–21)
CO2: 23 — AB (ref 13–22)
Chloride: 108 (ref 99–108)
Creatinine: 1.4 — AB (ref 0.6–1.3)
Glucose: 103
Potassium: 3.8 (ref 3.4–5.3)
Sodium: 138 (ref 137–147)

## 2020-05-30 NOTE — Progress Notes (Signed)
[place]  [address] [place],    [address] [phone]  Clinic Day:  05/31/2020  Referring physician: Jeanie Sewer, NP    CHIEF COMPLAINT:  CC: Metastatic carcinoid  Current Treatment:  Octreotide injections  HISTORY OF PRESENT ILLNESS:  Glenn Bender is a 60 y.o. male with metastatic carcinoid tumor diagnosed in September 2021.  The patient started to experience hematuria, and was felt to have a kidney stone, and so CT imaging was pursued.  CT imaging on September 8th which showed multiple liver masses, the largest measuring 4.0 x 4.3 cm, a terminal ileum mass measuring 4.0 x 2.8 x 3.7 cm, and a mass of the mesentery of the small intestine measuring 3.9 x 2.3 x 4.0 cm.  He was therefore scheduled for diagnostic colonoscopy with Dr. Coralie Keens on September 22nd which revealed a large mucosal covered mass protruding through the ileocecal valve with peristalsis.  Biopsy was collected and surgical pathology was benign.  Liver biopsy from September 28th was pursued and surgical pathology from this procedure confirmed metastatic neuroendocrine tumor, grade 2, consistent with gastrointestinal primary.  Synaptophysin and chromogranin-A immunostains were positive as well as CDX-2 supporting gastrointestinal origin.  Ki67 was 6%.  He reports flushing of his face with alcohol and certain foods.  He reported diarrhea, as well as flushing.  24 hour urine for 5HIAA was also elevated at 86.8, which is consistent with carcinoid.  Chromogranin A was elevated at 214 in October and came down to 116.9 in December.  CT chest from November 1st revealed left paratracheal adenopathy and pleural nodularity along the right hemidiaphragm measuring up to 12 mm, worrisome for metastatic disease.  CT chest from November 1st revealed left paratracheal adenopathy and pleural nodularity along the right hemidiaphragm measuring up to 12 mm, worrisome for metastatic disease.      INTERVAL HISTORY:  Glenn Bender is here for routine follow up prior to his next octreotide.  CT chest, abdomen and pelvis from February 1st revealed mostly stable to slightly decreased appearance of the carcinoid tumor, including the mesenteric lesion with cicatricial reaction, adjacent wall.  However there is, increased nodularity in a bandlike fashion along the mesenteric vessels adjacent to the carcinoid tumor.  There is also stable nodularity/lobularity along the right hemidiaphragm.  His hemoglobin is stable at 13.8, and his white count and platelets are normal.  Chemistries are unremarkable except for a creatinine of 1.4, down from 1.5.  He states that he has been well and feels great.  He denies complaints today.  His diarrhea has improved with increasing the octreotide to 30 mg.  He will be undergoing lithotripsy on February 15th.  His  appetite is good, and he has lost 2 pounds since his last visit.  He denies fever, chills or other signs of infection.  He denies nausea, vomiting, bowel issues, or abdominal pain.  He denies sore throat, cough, dyspnea, or chest pain.  REVIEW OF SYSTEMS:  Review of Systems  Constitutional: Negative.   HENT:  Negative.   Eyes: Negative.   Respiratory: Negative.   Cardiovascular: Negative.   Endocrine: Negative.   Genitourinary: Negative.    Musculoskeletal: Negative.   Skin: Negative.   Neurological: Negative.   Hematological: Negative.   Psychiatric/Behavioral: Negative.   All other systems reviewed and are negative.  VITALS:  Blood pressure 134/76, pulse 69, temperature 97.9 F (36.6 C), temperature source Oral, resp. rate 18, height $RemoveBe'5\' 10"'nqXrXtfbe$  (1.778 m), weight 240 lb 1.6 oz (108.9 kg),  SpO2 96 %.  Wt Readings from Last 3 Encounters:  05/31/20 240 lb 1.6 oz (108.9 kg)  04/17/20 236 lb 2 oz (107.1 kg)  04/14/20 242 lb 6.4 oz (110 kg)    Body mass index is 34.45 kg/m.  Performance status (ECOG): 0 - Asymptomatic  PHYSICAL EXAM:  Physical  Exam Constitutional:      General: He is not in acute distress.    Appearance: Normal appearance. He is normal weight.  HENT:     Head: Normocephalic and atraumatic.  Eyes:     General: No scleral icterus.    Extraocular Movements: Extraocular movements intact.     Conjunctiva/sclera: Conjunctivae normal.     Pupils: Pupils are equal, round, and reactive to light.  Cardiovascular:     Rate and Rhythm: Normal rate and regular rhythm.     Pulses: Normal pulses.     Heart sounds: Normal heart sounds. No murmur heard. No friction rub. No gallop.   Pulmonary:     Effort: Pulmonary effort is normal. No respiratory distress.     Breath sounds: Normal breath sounds.  Abdominal:     General: Bowel sounds are normal. There is no distension.     Palpations: Abdomen is soft. There is no mass.     Tenderness: There is no abdominal tenderness.  Musculoskeletal:        General: Normal range of motion.     Cervical back: Normal range of motion and neck supple.     Right lower leg: No edema.     Left lower leg: No edema.  Lymphadenopathy:     Cervical: No cervical adenopathy.  Skin:    General: Skin is warm and dry.  Neurological:     General: No focal deficit present.     Mental Status: He is alert and oriented to person, place, and time. Mental status is at baseline.  Psychiatric:        Mood and Affect: Mood normal.        Behavior: Behavior normal.        Thought Content: Thought content normal.        Judgment: Judgment normal.    LABS:   CBC Latest Ref Rng & Units 05/30/2020 04/14/2020 03/17/2020  WBC - 5.9 11.6 10.3  Hemoglobin 13.5 - 17.5 13.8 13.9 13.5  Hematocrit 41 - 53 41 42 41.0  Platelets 150 - 399 160 157 171   CMP Latest Ref Rng & Units 05/30/2020 04/14/2020 03/17/2020  Glucose 70 - 99 mg/dL - - 142(H)  BUN 4 - $R'21 10 13 11  'Vt$ Creatinine 0.6 - 1.3 1.4(A) 1.5(A) 1.46(H)  Sodium 137 - 147 138 138 140  Potassium 3.4 - 5.3 3.8 3.7 3.6  Chloride 99 - 108 108 103 107  CO2  13 - 22 23(A) 20 25  Calcium 8.7 - 10.7 8.9 8.5(A) 8.4(L)  Total Protein 6.5 - 8.1 g/dL - - 6.3(L)  Total Bilirubin 0.3 - 1.2 mg/dL - - 0.6  Alkaline Phos 25 - 125 - 54 50  AST 14 - 40 - 31 19  ALT 10 - 40 - 41(A) 23   Chromogranin A is pending.  STUDIES:   He underwent CT chest, abdomen and pelvis on 05/30/2020 showing: 1. Mostly stable appearance of the carcinoid tumor, including the mesenteric lesion with cicatricial reaction, adjacent wall thickening in distal ileal loop, and hepatic metastatic lesions.  However there is, increased nodularity in a bandlike fashion along the mesenteric vessels adjacent to  the carcinoid tumor. 2. Stable nodularity/lobularity along the right hemidiaphragm. 3. Mild splenomegaly. 4. Staghorn calculus in the left renal collecting system with stable mild stranding in the adipose tissues ule and the left renal collecting system likely due to local irritation. 5. Other imaging findings of potential clinical significance: Coronary atherosclerosis. New 2.3 by 1.6 cm subcutaneous density along the left upper buttock region, possibly from injection or small focus of inflammation. Lower lumbar spondylosis and degenerative disc disease causing impingement at L4-5 and L5-S1.  Stable fatty left spermatic cord. 6. Aortic atherosclerosis.  Allergies:  Allergies  Allergen Reactions  . Tape Rash    Current Medications: Current Outpatient Medications  Medication Sig Dispense Refill  . Ascorbic Acid (VITAMIN C WITH ROSE HIPS) 500 MG tablet Take 500 mg by mouth 3 (three) times a week.    . Biotin 5000 MCG TABS Take 5,000 mcg by mouth 3 (three) times a week.    . Cholecalciferol (VITAMIN D-3) 125 MCG (5000 UT) TABS Take 5,000 Units by mouth 3 (three) times a week.    Marland Kitchen octreotide (SANDOSTATIN LAR) 20 MG injection Inject 20 mg into the muscle every 28 (twenty-eight) days.    Marland Kitchen OVER THE COUNTER MEDICATION Take 1 tablet by mouth 3 (three) times a week. Beet root    . zinc  sulfate 220 (50 Zn) MG capsule Take 50 mg by mouth daily.     No current facility-administered medications for this visit.     ASSESSMENT & PLAN:   Assessment:   1. 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  He does have a terminal ileum mass measuring 4.0 x 2.8 x 3.7 cm, and a mass of the mesentery of the small intestine measuring 3.9 x 2.3 x 4.0 cm.  As this has already metastasized, surgical resection is not indicated.  24 hour urine for 5 HIAA was quite elevated at 86.8, confirming carcinoid syndrome.  Chromogranin A was also elevated at 214. He started treatment with octreotide injections on October 25th and continues to tolerate this without difficulty.  Due to the persistent diarrhea, we increased the octreotide to 30 mg every 4 weeks.  2. Multiple liver masses, the largest measuring 4.0 x 4.3 cm, consistent with metastatic carcinoid with carcinoid syndrome.  3. History of testicular cancer diagnosed in 1996, treated with orchiectomy and chemotherapy.   4. Nonobstructive calculi in the left renal collecting system measuring up to 2.0 x 0.6 x 1.0 cm, in the left renal pelvis. He is being scheduled for lithotripsy.   5. Diarrhea, secondary to carcinoid malignancy.  This has improved with increasing the octreotide to 30 mg.  6. Mild renal insufficiency, which is stable and may be related to the nephrolithiasis.   Plan: He will proceed with his 4th dose of octreotide 30 mg on February 3rd, and continue these every 4 weeks.  We will plan to see him back in 8 weeks with CBC, CMP and chromagranin A prior to octreotide.   The patient and his wife understand the plans discussed today and are in agreement with them.  They know to contact our office if he develops signs or symptoms of infection or other issues requiring immediate clinical assessment.   Derwood Kaplan, MD [facility] AT [facility] [address] [place] [address] Dept: [phone] Dept Fax: [phone]

## 2020-05-31 ENCOUNTER — Other Ambulatory Visit: Payer: Self-pay | Admitting: Hematology and Oncology

## 2020-05-31 ENCOUNTER — Other Ambulatory Visit: Payer: Self-pay | Admitting: Oncology

## 2020-05-31 ENCOUNTER — Inpatient Hospital Stay: Payer: 59

## 2020-05-31 ENCOUNTER — Encounter: Payer: Self-pay | Admitting: Oncology

## 2020-05-31 ENCOUNTER — Inpatient Hospital Stay: Payer: 59 | Attending: Oncology | Admitting: Oncology

## 2020-05-31 VITALS — BP 134/76 | HR 69 | Temp 97.9°F | Resp 18 | Ht 70.0 in | Wt 240.1 lb

## 2020-05-31 DIAGNOSIS — E340 Carcinoid syndrome: Secondary | ICD-10-CM | POA: Insufficient documentation

## 2020-05-31 DIAGNOSIS — C7A00 Malignant carcinoid tumor of unspecified site: Secondary | ICD-10-CM

## 2020-05-31 DIAGNOSIS — C7B8 Other secondary neuroendocrine tumors: Secondary | ICD-10-CM

## 2020-05-31 DIAGNOSIS — Z79899 Other long term (current) drug therapy: Secondary | ICD-10-CM | POA: Insufficient documentation

## 2020-05-31 DIAGNOSIS — C7B02 Secondary carcinoid tumors of liver: Secondary | ICD-10-CM | POA: Insufficient documentation

## 2020-05-31 LAB — CBC: MCV: 85 (ref 80–94)

## 2020-06-01 ENCOUNTER — Other Ambulatory Visit: Payer: Self-pay

## 2020-06-01 ENCOUNTER — Inpatient Hospital Stay: Payer: 59

## 2020-06-01 VITALS — BP 128/72 | HR 60 | Temp 98.1°F | Resp 18 | Ht 70.0 in | Wt 240.5 lb

## 2020-06-01 DIAGNOSIS — C7A00 Malignant carcinoid tumor of unspecified site: Secondary | ICD-10-CM

## 2020-06-01 DIAGNOSIS — C7B8 Other secondary neuroendocrine tumors: Secondary | ICD-10-CM

## 2020-06-01 DIAGNOSIS — Z79899 Other long term (current) drug therapy: Secondary | ICD-10-CM | POA: Diagnosis not present

## 2020-06-01 DIAGNOSIS — C7B02 Secondary carcinoid tumors of liver: Secondary | ICD-10-CM | POA: Diagnosis present

## 2020-06-01 DIAGNOSIS — E340 Carcinoid syndrome: Secondary | ICD-10-CM | POA: Diagnosis present

## 2020-06-01 MED ORDER — OCTREOTIDE ACETATE 30 MG IM KIT
PACK | INTRAMUSCULAR | Status: AC
  Filled 2020-06-01: qty 1

## 2020-06-01 MED ORDER — OCTREOTIDE ACETATE 30 MG IM KIT
30.0000 mg | PACK | Freq: Once | INTRAMUSCULAR | Status: AC
  Administered 2020-06-01: 30 mg via INTRAMUSCULAR

## 2020-06-01 NOTE — Patient Instructions (Signed)
Octreotide acetate injection suspension What is this medicine? OCTREOTIDE (ok TREE oh tide) is used to reduce blood levels of growth hormone in patients with a condition called acromegaly. This medicine also reduces flushing and watery diarrhea caused by certain types of cancer. This medicine may be used for other purposes; ask your health care provider or pharmacist if you have questions. COMMON BRAND NAME(S): Sandostatin LAR What should I tell my health care provider before I take this medicine? They need to know if you have any of these conditions:  diabetes  gallbladder disease  kidney disease  liver disease  thyroid disease  an unusual or allergic reaction to octreotide, other medicines, foods, dyes, or preservatives  pregnant or trying to get pregnant  breast-feeding How should I use this medicine? This medicine is for injection into a muscle. It is usually given by a health care professional in a hospital or clinic setting. Talk to your pediatrician regarding the use of this medicine in children. Special care may be needed. Overdosage: If you think you have taken too much of this medicine contact a poison control center or emergency room at once. NOTE: This medicine is only for you. Do not share this medicine with others. What if I miss a dose? Keep appointments for follow-up doses. It is important not to miss your dose. Call your doctor or health care professional if you are unable to keep an appointment. What may interact with this medicine? Do not take this medicine with any of the following medications:  cisapride  dronedarone  flibanserin  lutetium Lu 177 dotatate  pimozide  saquinavir  thioridazine This medicine may also interact with the following medications:  bromocriptine  certain medicines for blood pressure, heart disease, irregular heartbeat  cyclosporine  diuretics  medicines for diabetes, including insulin  quinidine This list may not  describe all possible interactions. Give your health care provider a list of all the medicines, herbs, non-prescription drugs, or dietary supplements you use. Also tell them if you smoke, drink alcohol, or use illegal drugs. Some items may interact with your medicine. What should I watch for while using this medicine? Visit your health care professional for regular checks on your progress. Tell your health care professional if your symptoms do not start to get better or if they get worse. This medicine may cause decreases in blood sugar. Signs of low blood sugar include chills, cool, pale skin or cold sweats, drowsiness, extreme hunger, fast heartbeat, headache, nausea, nervousness or anxiety, shakiness, trembling, unsteadiness, tiredness, or weakness. Contact your doctor or health care professional right away if you experience any of these symptoms. This medicine may increase blood sugar. Ask your healthcare provider if changes in diet or medicines are needed if you have diabetes. This medicine may cause a decrease in vitamin B12. You should make sure that you get enough vitamin B12 while you are taking this medicine. Discuss the foods you eat and the vitamins you take with your health care professional. What side effects may I notice from receiving this medicine? Side effects that you should report to your doctor or health care professional as soon as possible:  allergic reactions like skin rash, itching or hives, swelling of the face, lips, or tongue  fast, slow, or irregular heartbeat  right upper belly pain  severe stomach pain  signs and symptoms of high blood sugar such as being more thirsty or hungry or having to urinate more than normal. You may also feel very tired or have   blurry vision.  signs and symptoms of low blood sugar such as feeling anxious; confusion; dizziness; increased hunger; unusually weak or tired; increased sweating; shakiness; cold, clammy skin; irritable; headache;  blurred vision; fast heartbeat; loss of consciousness  unusually weak or tired Side effects that usually do not require medical attention (report these to your doctor or health care professional if they continue or are bothersome):  diarrhea  dizziness  gas  headache  nausea, vomiting  pain, redness, or irritation at site where injected  upset stomach This list may not describe all possible side effects. Call your doctor for medical advice about side effects. You may report side effects to FDA at 1-800-FDA-1088. Where should I keep my medicine? This medicine is given in a hospital or clinic and will not be stored at home. NOTE: This sheet is a summary. It may not cover all possible information. If you have questions about this medicine, talk to your doctor, pharmacist, or health care provider.  2021 Elsevier/Gold Standard (2019-08-03 18:31:50)  

## 2020-06-06 NOTE — Patient Instructions (Addendum)
DUE TO COVID-19 ONLY ONE VISITOR IS ALLOWED TO COME WITH YOU AND STAY IN THE WAITING ROOM ONLY DURING PRE OP AND PROCEDURE DAY OF SURGERY. THE 1 VISITOR  MAY VISIT WITH YOU AFTER SURGERY IN YOUR PRIVATE ROOM DURING VISITING HOURS ONLY!  YOU NEED TO HAVE A COVID 19 TEST ON_2/11______ @_11 :10______, THIS TEST MUST BE DONE BEFORE SURGERY,  COVID TESTING SITE [place] [address], IT IS ON THE RIGHT GOING OUT [address] APPROXIMATELY  2 MINUTES PAST [employer] ON THE RIGHT. ONCE YOUR COVID TEST IS COMPLETED,  PLEASE BEGIN THE QUARANTINE INSTRUCTIONS AS OUTLINED IN YOUR HANDOUT.                Glenn Bender   Your procedure is scheduled on: 06/13/20   Report to [facility] Main  Entrance   Report to Admitting at 8:00 AM     Call this number if you have problems the morning of surgery [phone]    Remember: Do not eat food or drink liquids :After Midnight  . BRUSH YOUR TEETH MORNING OF SURGERY AND RINSE YOUR MOUTH OUT, NO CHEWING GUM CANDY OR MINTS.     Take these medicines the morning of surgery with A SIP OF WATER: none                                You may not have any metal on your body including               piercings  Do not wear jewelry, , lotions, powders or deodorant                        Men may shave face and neck.   Do not bring valuables to the hospital. [place].  Contacts, dentures or bridgework may not be worn into surgery.                 Please read over the following fact sheets you were given: _____________________________________________________________________             [facility] - Preparing for Surgery Before surgery, you can play an important role.   Because skin is not sterile, your skin needs to be as free of germs as possible.   You can reduce the number of germs on your skin by washing with CHG (chlorahexidine gluconate) soap before surgery.   CHG  is an antiseptic cleaner which kills germs and bonds with the skin to continue killing germs even after washing. Please DO NOT use if you have an allergy to CHG or antibacterial soaps.   If your skin becomes reddened/irritated stop using the CHG and inform your nurse when you arrive at Short Stay.  You may shave your face/neck. Please follow these instructions carefully:  1.  Shower with CHG Soap the night before surgery and the  morning of Surgery.  2.  If you choose to wash your hair, wash your hair first as usual with your  normal  shampoo.  3.  After you shampoo, rinse your hair and body thoroughly to remove the  shampoo.  4.  Use CHG as you would any other liquid soap.  You can apply chg directly  to the skin and wash                       Gently with a scrungie or clean washcloth.  5.  Apply the CHG Soap to your body ONLY FROM THE NECK DOWN.   Do not use on face/ open                           Wound or open sores. Avoid contact with eyes, ears mouth and genitals (private parts).                       Wash face,  Genitals (private parts) with your normal soap.             6.  Wash thoroughly, paying special attention to the area where your surgery  will be performed.  7.  Thoroughly rinse your body with warm water from the neck down.  8.  DO NOT shower/wash with your normal soap after using and rinsing off  the CHG Soap.             9.  Pat yourself dry with a clean towel.            10.  Wear clean pajamas.            11.  Place clean sheets on your bed the night of your first shower and do not  sleep with pets. Day of Surgery : Do not apply any lotions/deodorants the morning of surgery.  Please wear clean clothes to the hospital/surgery center.  FAILURE TO FOLLOW THESE INSTRUCTIONS MAY RESULT IN THE CANCELLATION OF YOUR SURGERY PATIENT SIGNATURE_________________________________  NURSE  SIGNATURE__________________________________  ________________________________________________________________________

## 2020-06-07 ENCOUNTER — Encounter (HOSPITAL_COMMUNITY): Payer: Self-pay

## 2020-06-07 ENCOUNTER — Other Ambulatory Visit: Payer: Self-pay

## 2020-06-07 ENCOUNTER — Encounter (HOSPITAL_COMMUNITY)
Admission: RE | Admit: 2020-06-07 | Discharge: 2020-06-07 | Disposition: A | Discharge status: Home / Self Care | Payer: 59 | Source: Ambulatory Visit | Attending: Urology | Admitting: Urology

## 2020-06-07 DIAGNOSIS — Z01812 Encounter for preprocedural laboratory examination: Secondary | ICD-10-CM | POA: Diagnosis present

## 2020-06-07 HISTORY — DX: Unspecified osteoarthritis, unspecified site: M19.90

## 2020-06-07 LAB — CBC
HCT: 40.8 % (ref 39.0–52.0)
Hemoglobin: 13.4 g/dL (ref 13.0–17.0)
MCH: 28.9 pg (ref 26.0–34.0)
MCHC: 32.8 g/dL (ref 30.0–36.0)
MCV: 88.1 fL (ref 80.0–100.0)
Platelets: 180 10*3/uL (ref 150–400)
RBC: 4.63 MIL/uL (ref 4.22–5.81)
RDW: 14.1 % (ref 11.5–15.5)
WBC: 7.8 10*3/uL (ref 4.0–10.5)
nRBC: 0 % (ref 0.0–0.2)

## 2020-06-07 NOTE — Progress Notes (Signed)
COVID Vaccine Completed:Yes Date COVID Vaccine completed:08/02/19-Booster 04/10/20 COVID vaccine manufacturer:   [employer]   PCP - Jeanie Sewer Cardiologist - none  Chest x-ray - no EKG - no Stress Test - no ECHO - no Cardiac Cath - no Pacemaker/ICD device last checked:NA  Sleep Study - no CPAP -   Fasting Blood Sugar - NA Checks Blood Sugar _____ times a day  Blood Thinner Instructions:NA Aspirin Instructions: Last Dose:  Anesthesia review:   Patient denies shortness of breath, fever, cough and chest pain at PAT appointment yes  Patient verbalized understanding of instructions that were given to them at the PAT appointment. Patient was also instructed that they will need to review over the PAT instructions again at home before surgery. Yes Pt has no SOB with any activities.  He has a metastatic tumor and multiple other sites.

## 2020-06-09 ENCOUNTER — Other Ambulatory Visit (HOSPITAL_COMMUNITY)
Admission: RE | Admit: 2020-06-09 | Discharge: 2020-06-09 | Disposition: A | Discharge status: Home / Self Care | Payer: 59 | Source: Ambulatory Visit | Attending: Urology | Admitting: Urology

## 2020-06-09 DIAGNOSIS — Z01812 Encounter for preprocedural laboratory examination: Secondary | ICD-10-CM | POA: Diagnosis not present

## 2020-06-09 DIAGNOSIS — Z20822 Contact with and (suspected) exposure to covid-19: Secondary | ICD-10-CM | POA: Insufficient documentation

## 2020-06-09 LAB — SARS CORONAVIRUS 2 (TAT 6-24 HRS): SARS Coronavirus 2: NEGATIVE

## 2020-06-12 ENCOUNTER — Other Ambulatory Visit: Payer: Self-pay | Admitting: Radiology

## 2020-06-12 ENCOUNTER — Other Ambulatory Visit: Payer: Self-pay | Admitting: Student

## 2020-06-12 MED ORDER — GENTAMICIN SULFATE 40 MG/ML IJ SOLN
5.0000 mg/kg | INTRAVENOUS | Status: AC
  Administered 2020-06-13: 440 mg via INTRAVENOUS
  Filled 2020-06-12: qty 11

## 2020-06-12 NOTE — H&P (Signed)
CC/HPI: cc: left renal calculus   04/13/20: 60 year old man referred by Dr. Nila Nephew for a 2 cm left renal calculus. Patient has never passed a kidney stone on his own. CT of the abdomen pelvis in September 2021 revealed 2 cm left renal pelvis calculus. Patient is not on any blood thinners and denies any lower urinary tract symptoms. He had a PSA on 12/24/2019 that was 1.6. He has never had surgery for kidney stones.   05/09/19: Patient with above history. He is scheduled for left PCNL on 01/18. He denies any current or interval episodes of severe left flank pain. He denies any abdominal pain. He denies any persistent gross hematuria. No complaints of dysuria or exacerbation in voiding symptoms. He denies any fever, chills, nausea, or vomiting. He denies any chest pain or shortness of breath. No dizziness or lightheadedness. He denies any changes in his overall health history since prior office visit. He is not on any new medications. He is not on blood thinners.     ALLERGIES: No Known Drug Allergies    MEDICATIONS: Tamsulosin Hcl 0.4 mg capsule     GU PSH: None     PSH Notes: testicular, lymph node   NON-GU PSH: None   GU PMH: Renal calculus, Reviewed patient's CT of the abdomen pelvis which reveals a 2 cm left renal pelvis stone. We discussed management of this this stone which is quite large and have recommended a left PCNL. Risks and benefits of the procedure were discussed with the patient including pain, bleeding, damage to adjacent structures, need for staged intervention/inability removed stone in 1 setting, infection day. Patient understands the risks and would like to proceed with the surgery. He will be scheduled for IR access followed by PCNL. - 04/13/2020 History of testicular cancer      PMH Notes: liver cancer   NON-GU PMH: DVT, History    FAMILY HISTORY: kidney stone - Mother   SOCIAL HISTORY: Marital Status: Married Preferred Language: English; Ethnicity: Not Hispanic Or  Latino; Race: White Current Smoking Status: Patient does not smoke anymore. Has not smoked since 03/30/1995. Smoked for 20 years.   Tobacco Use Assessment Completed: Used Tobacco in last 30 days? Drinks 4 drinks per day.  Drinks 2 caffeinated drinks per day.    REVIEW OF SYSTEMS:    GU Review Male:   Patient denies hard to postpone urination, burning/ pain with urination, get up at night to urinate, leakage of urine, stream starts and stops, trouble starting your stream, have to strain to urinate , erection problems, and penile pain.  Gastrointestinal (Upper):   Patient denies nausea, vomiting, and indigestion/ heartburn.  Gastrointestinal (Lower):   Patient denies diarrhea and constipation.  Constitutional:   Patient denies fever, weight loss, night sweats, and fatigue.  Skin:   Patient denies skin rash/ lesion and itching.  Eyes:   Patient denies blurred vision and double vision.  Ears/ Nose/ Throat:   Patient denies sore throat and sinus problems.  Hematologic/Lymphatic:   Patient denies swollen glands and easy bruising.  Cardiovascular:   Patient denies leg swelling and chest pains.  Respiratory:   Patient denies cough and shortness of breath.  Endocrine:   Patient denies excessive thirst.  Musculoskeletal:   Patient denies back pain and joint pain.  Neurological:   Patient denies headaches and dizziness.  Psychologic:   Patient denies depression and anxiety.   Notes: Pre op    VITAL SIGNS:      05/08/2020 02:38 PM  Weight 238.7 lb / 108.27 kg  Height 70 in / 177.8 cm  BP 132/72 mmHg  Pulse 59 /min  Temperature 97.5 F / 36.3 C  BMI 34.2 kg/m   MULTI-SYSTEM PHYSICAL EXAMINATION:    Constitutional: Well-nourished. No physical deformities. Normally developed. Good grooming.  Respiratory: Normal breath sounds. No labored breathing, no use of accessory muscles.   Cardiovascular: Regular rate and rhythm. No murmur, no gallop. Normal temperature, normal extremity pulses, no  swelling, no varicosities.   Skin: No paleness, no jaundice, no cyanosis. No lesion, no ulcer, no rash.  Neurologic / Psychiatric: Oriented to time, oriented to place, oriented to person. No depression, no anxiety, no agitation.  Gastrointestinal: No mass, no tenderness, no rigidity, non obese abdomen.  Musculoskeletal: Normal gait and station of head and neck.     Complexity of Data:  Records Review:   Previous Patient Records  Urine Test Review:   Urinalysis, Urine Culture  X-Ray Review: C.T. Abdomen/Pelvis: Reviewed Films. Reviewed Report.     PROCEDURES:          Urinalysis w/Scope Dipstick Dipstick Cont'd Micro  Color: Yellow Bilirubin: Neg mg/dL WBC/hpf: NS (Not Seen)  Appearance: Clear Ketones: Neg mg/dL RBC/hpf: 3 - 10/hpf  Specific Gravity: 1.025 Blood: 2+ ery/uL Bacteria: Rare (0-9/hpf)  pH: 5.5 Protein: Neg mg/dL Cystals: NS (Not Seen)  Glucose: Neg mg/dL Urobilinogen: 0.2 mg/dL Casts: NS (Not Seen)    Nitrites: Neg Trichomonas: Not Present    Leukocyte Esterase: Neg leu/uL Mucous: Not Present      Epithelial Cells: NS (Not Seen)      Yeast: NS (Not Seen)      Sperm: Not Present    ASSESSMENT:      ICD-10 Details  1 GU:   Renal calculus - N20.0    PLAN:           Orders Labs Urine Culture          Schedule Return Visit/Planned Activity: Keep Scheduled Appointment          Document Letter(s):  Created for Patient: Clinical Summary         Notes:   Urinalysis today shows microscopic hematuria. Precautionary culture was sent. We reviewed his upcoming surgical procedure in detail today. I answered all of his questions to the best of my ability. Preoperative instructions were reviewed. He will return for surgery, as planned. Return precautions discussed in the interim.

## 2020-06-13 ENCOUNTER — Inpatient Hospital Stay (HOSPITAL_COMMUNITY): Payer: 59 | Admitting: Certified Registered"

## 2020-06-13 ENCOUNTER — Inpatient Hospital Stay (HOSPITAL_COMMUNITY): Payer: 59

## 2020-06-13 ENCOUNTER — Ambulatory Visit (HOSPITAL_COMMUNITY)
Admission: RE | Admit: 2020-06-13 | Discharge: 2020-06-13 | Disposition: A | Discharge status: Home / Self Care | Payer: 59 | Source: Ambulatory Visit | Attending: Urology | Admitting: Urology

## 2020-06-13 ENCOUNTER — Encounter (HOSPITAL_COMMUNITY)
Admission: RE | Disposition: A | Discharge status: Home / Self Care | Payer: Self-pay | Source: Home / Self Care | Attending: Urology

## 2020-06-13 ENCOUNTER — Encounter (HOSPITAL_COMMUNITY): Payer: Self-pay | Admitting: Urology

## 2020-06-13 ENCOUNTER — Inpatient Hospital Stay (HOSPITAL_COMMUNITY)
Admission: RE | Admit: 2020-06-13 | Discharge: 2020-06-16 | DRG: 660 | Disposition: A | Discharge status: Home / Self Care | Payer: 59 | Attending: Urology | Admitting: Urology

## 2020-06-13 ENCOUNTER — Other Ambulatory Visit: Payer: Self-pay

## 2020-06-13 DIAGNOSIS — R310 Gross hematuria: Secondary | ICD-10-CM | POA: Diagnosis present

## 2020-06-13 DIAGNOSIS — Z936 Other artificial openings of urinary tract status: Secondary | ICD-10-CM | POA: Diagnosis not present

## 2020-06-13 DIAGNOSIS — Z87891 Personal history of nicotine dependence: Secondary | ICD-10-CM | POA: Diagnosis not present

## 2020-06-13 DIAGNOSIS — M19071 Primary osteoarthritis, right ankle and foot: Secondary | ICD-10-CM | POA: Diagnosis present

## 2020-06-13 DIAGNOSIS — T83122A Displacement of urinary stent, initial encounter: Secondary | ICD-10-CM | POA: Diagnosis present

## 2020-06-13 DIAGNOSIS — Z8616 Personal history of COVID-19: Secondary | ICD-10-CM

## 2020-06-13 DIAGNOSIS — Z8547 Personal history of malignant neoplasm of testis: Secondary | ICD-10-CM | POA: Diagnosis not present

## 2020-06-13 DIAGNOSIS — N200 Calculus of kidney: Principal | ICD-10-CM

## 2020-06-13 DIAGNOSIS — Z87442 Personal history of urinary calculi: Secondary | ICD-10-CM

## 2020-06-13 DIAGNOSIS — Z85060 Personal history of malignant carcinoid tumor of small intestine: Secondary | ICD-10-CM

## 2020-06-13 DIAGNOSIS — D3A00 Benign carcinoid tumor of unspecified site: Secondary | ICD-10-CM | POA: Diagnosis present

## 2020-06-13 DIAGNOSIS — Z79899 Other long term (current) drug therapy: Secondary | ICD-10-CM

## 2020-06-13 DIAGNOSIS — Z20822 Contact with and (suspected) exposure to covid-19: Secondary | ICD-10-CM | POA: Diagnosis present

## 2020-06-13 DIAGNOSIS — Z9079 Acquired absence of other genital organ(s): Secondary | ICD-10-CM | POA: Diagnosis not present

## 2020-06-13 HISTORY — PX: IR URETERAL STENT LEFT NEW ACCESS W/O SEP NEPHROSTOMY CATH: IMG6075

## 2020-06-13 HISTORY — PX: NEPHROLITHOTOMY: SHX5134

## 2020-06-13 LAB — CBC WITH DIFFERENTIAL/PLATELET
Abs Immature Granulocytes: 0.03 10*3/uL (ref 0.00–0.07)
Basophils Absolute: 0.1 10*3/uL (ref 0.0–0.1)
Basophils Relative: 1 %
Eosinophils Absolute: 0.2 10*3/uL (ref 0.0–0.5)
Eosinophils Relative: 4 %
HCT: 43.5 % (ref 39.0–52.0)
Hemoglobin: 14.3 g/dL (ref 13.0–17.0)
Immature Granulocytes: 1 %
Lymphocytes Relative: 18 %
Lymphs Abs: 1.2 10*3/uL (ref 0.7–4.0)
MCH: 28.8 pg (ref 26.0–34.0)
MCHC: 32.9 g/dL (ref 30.0–36.0)
MCV: 87.7 fL (ref 80.0–100.0)
Monocytes Absolute: 0.4 10*3/uL (ref 0.1–1.0)
Monocytes Relative: 5 %
Neutro Abs: 4.6 10*3/uL (ref 1.7–7.7)
Neutrophils Relative %: 71 %
Platelets: 154 10*3/uL (ref 150–400)
RBC: 4.96 MIL/uL (ref 4.22–5.81)
RDW: 14.2 % (ref 11.5–15.5)
WBC: 6.5 10*3/uL (ref 4.0–10.5)
nRBC: 0 % (ref 0.0–0.2)

## 2020-06-13 LAB — URINALYSIS, ROUTINE W REFLEX MICROSCOPIC
Bilirubin Urine: NEGATIVE
Glucose, UA: NEGATIVE mg/dL
Ketones, ur: NEGATIVE mg/dL
Leukocytes,Ua: NEGATIVE
Nitrite: NEGATIVE
Protein, ur: 100 mg/dL — AB
Specific Gravity, Urine: 1.018 (ref 1.005–1.030)
pH: 5 (ref 5.0–8.0)

## 2020-06-13 LAB — BASIC METABOLIC PANEL
Anion gap: 8 (ref 5–15)
BUN: 14 mg/dL (ref 8–23)
CO2: 24 mmol/L (ref 22–32)
Calcium: 8.6 mg/dL — ABNORMAL LOW (ref 8.9–10.3)
Chloride: 110 mmol/L (ref 98–111)
Creatinine, Ser: 1.46 mg/dL — ABNORMAL HIGH (ref 0.61–1.24)
GFR, Estimated: 54 mL/min — ABNORMAL LOW (ref >60)
Glucose, Bld: 129 mg/dL — ABNORMAL HIGH (ref 70–99)
Potassium: 3.7 mmol/L (ref 3.5–5.1)
Sodium: 142 mmol/L (ref 135–145)

## 2020-06-13 LAB — CBC
HCT: 42.2 % (ref 39.0–52.0)
Hemoglobin: 13.5 g/dL (ref 13.0–17.0)
MCH: 28.5 pg (ref 26.0–34.0)
MCHC: 32 g/dL (ref 30.0–36.0)
MCV: 89.2 fL (ref 80.0–100.0)
Platelets: 145 K/uL — ABNORMAL LOW (ref 150–400)
RBC: 4.73 MIL/uL (ref 4.22–5.81)
RDW: 14.2 % (ref 11.5–15.5)
WBC: 7.1 K/uL (ref 4.0–10.5)
nRBC: 0 % (ref 0.0–0.2)

## 2020-06-13 LAB — PROTIME-INR
INR: 1.2 (ref 0.8–1.2)
Prothrombin Time: 14.4 seconds (ref 11.4–15.2)

## 2020-06-13 LAB — BASIC METABOLIC PANEL WITH GFR
Anion gap: 8 (ref 5–15)
BUN: 12 mg/dL (ref 8–23)
CO2: 26 mmol/L (ref 22–32)
Calcium: 8.2 mg/dL — ABNORMAL LOW (ref 8.9–10.3)
Chloride: 106 mmol/L (ref 98–111)
Creatinine, Ser: 1.43 mg/dL — ABNORMAL HIGH (ref 0.61–1.24)
GFR, Estimated: 56 mL/min — ABNORMAL LOW (ref >60)
Glucose, Bld: 150 mg/dL — ABNORMAL HIGH (ref 70–99)
Potassium: 4.2 mmol/L (ref 3.5–5.1)
Sodium: 140 mmol/L (ref 135–145)

## 2020-06-13 SURGERY — NEPHROLITHOTOMY PERCUTANEOUS
Anesthesia: General | Laterality: Left

## 2020-06-13 MED ORDER — MIDAZOLAM HCL 2 MG/2ML IJ SOLN
INTRAMUSCULAR | Status: AC
  Filled 2020-06-13: qty 2

## 2020-06-13 MED ORDER — SUGAMMADEX SODIUM 500 MG/5ML IV SOLN
INTRAVENOUS | Status: AC
  Filled 2020-06-13: qty 5

## 2020-06-13 MED ORDER — ORAL CARE MOUTH RINSE: 15.0000 mL | Freq: Once | OROMUCOSAL | Status: AC

## 2020-06-13 MED ORDER — OXYCODONE HCL 5 MG PO TABS: 5.0000 mg | ORAL_TABLET | Freq: Once | ORAL | Status: DC | PRN

## 2020-06-13 MED ORDER — IOHEXOL 300 MG/ML  SOLN
50.0000 mL | Freq: Once | INTRAMUSCULAR | Status: AC | PRN
  Administered 2020-06-13: 15 mL

## 2020-06-13 MED ORDER — FENTANYL CITRATE (PF) 250 MCG/5ML IJ SOLN
INTRAMUSCULAR | Status: DC | PRN
  Administered 2020-06-13: 100 ug via INTRAVENOUS
  Administered 2020-06-13 (×2): 50 ug via INTRAVENOUS

## 2020-06-13 MED ORDER — LIDOCAINE 2% (20 MG/ML) 5 ML SYRINGE
INTRAMUSCULAR | Status: DC | PRN
  Administered 2020-06-13: 60 mg via INTRAVENOUS

## 2020-06-13 MED ORDER — CHLORHEXIDINE GLUCONATE CLOTH 2 % EX PADS: 6.0000 | MEDICATED_PAD | Freq: Every day | CUTANEOUS | Status: DC

## 2020-06-13 MED ORDER — ACETAMINOPHEN 500 MG PO TABS
1000.0000 mg | ORAL_TABLET | Freq: Four times a day (QID) | ORAL | Status: AC
  Administered 2020-06-13 – 2020-06-14 (×4): 1000 mg via ORAL
  Filled 2020-06-13 (×4): qty 2

## 2020-06-13 MED ORDER — FENTANYL CITRATE (PF) 100 MCG/2ML IJ SOLN
INTRAMUSCULAR | Status: AC
  Filled 2020-06-13: qty 2

## 2020-06-13 MED ORDER — ONDANSETRON HCL 4 MG/2ML IJ SOLN
INTRAMUSCULAR | Status: DC | PRN
  Administered 2020-06-13: 4 mg via INTRAVENOUS

## 2020-06-13 MED ORDER — CEFAZOLIN SODIUM-DEXTROSE 2-4 GM/100ML-% IV SOLN
2.0000 g | Freq: Three times a day (TID) | INTRAVENOUS | Status: AC
  Administered 2020-06-13 – 2020-06-14 (×2): 2 g via INTRAVENOUS
  Filled 2020-06-13 (×2): qty 100

## 2020-06-13 MED ORDER — SODIUM CHLORIDE 0.9 % IV SOLN
INTRAVENOUS | Status: AC
  Filled 2020-06-13: qty 20

## 2020-06-13 MED ORDER — MIDAZOLAM HCL 2 MG/2ML IJ SOLN
INTRAMUSCULAR | Status: AC
  Filled 2020-06-13: qty 4

## 2020-06-13 MED ORDER — SODIUM CHLORIDE 0.9 % IV SOLN
2.0000 g | Freq: Once | INTRAVENOUS | Status: AC
  Administered 2020-06-13: 2 g via INTRAVENOUS

## 2020-06-13 MED ORDER — SODIUM CHLORIDE 0.9 % IR SOLN
Status: DC | PRN
  Administered 2020-06-13: 18000 mL

## 2020-06-13 MED ORDER — SUGAMMADEX SODIUM 200 MG/2ML IV SOLN
INTRAVENOUS | Status: DC | PRN
  Administered 2020-06-13: 250 mg via INTRAVENOUS

## 2020-06-13 MED ORDER — CHLORHEXIDINE GLUCONATE 0.12 % MT SOLN
15.0000 mL | Freq: Once | OROMUCOSAL | Status: AC
  Administered 2020-06-13: 15 mL via OROMUCOSAL

## 2020-06-13 MED ORDER — CEFAZOLIN SODIUM-DEXTROSE 2-4 GM/100ML-% IV SOLN
2.0000 g | INTRAVENOUS | Status: AC
  Administered 2020-06-13: 2 g via INTRAVENOUS
  Filled 2020-06-13: qty 100

## 2020-06-13 MED ORDER — FENTANYL CITRATE (PF) 100 MCG/2ML IJ SOLN
25.0000 ug | INTRAMUSCULAR | Status: DC | PRN
  Administered 2020-06-13: 25 ug via INTRAVENOUS
  Administered 2020-06-13: 50 ug via INTRAVENOUS

## 2020-06-13 MED ORDER — LIDOCAINE HCL 1 % IJ SOLN
INTRAMUSCULAR | Status: AC
  Filled 2020-06-13: qty 20

## 2020-06-13 MED ORDER — OXYCODONE HCL 5 MG PO TABS
5.0000 mg | ORAL_TABLET | ORAL | Status: DC | PRN
  Administered 2020-06-13 – 2020-06-15 (×6): 5 mg via ORAL
  Filled 2020-06-13 (×6): qty 1

## 2020-06-13 MED ORDER — PROPOFOL 10 MG/ML IV BOLUS
INTRAVENOUS | Status: DC | PRN
  Administered 2020-06-13: 160 mg via INTRAVENOUS

## 2020-06-13 MED ORDER — FENTANYL CITRATE (PF) 100 MCG/2ML IJ SOLN
INTRAMUSCULAR | Status: AC | PRN
  Administered 2020-06-13: 50 ug via INTRAVENOUS

## 2020-06-13 MED ORDER — SODIUM CHLORIDE 0.9 % IV SOLN: INTRAVENOUS | Status: DC

## 2020-06-13 MED ORDER — BACITRACIN-NEOMYCIN-POLYMYXIN 400-5-5000 EX OINT: 1.0000 "application " | TOPICAL_OINTMENT | Freq: Three times a day (TID) | CUTANEOUS | Status: DC | PRN

## 2020-06-13 MED ORDER — PROMETHAZINE HCL 25 MG/ML IJ SOLN: 6.2500 mg | INTRAMUSCULAR | Status: DC | PRN

## 2020-06-13 MED ORDER — DOCUSATE SODIUM 100 MG PO CAPS
100.0000 mg | ORAL_CAPSULE | Freq: Two times a day (BID) | ORAL | Status: DC
  Administered 2020-06-13 – 2020-06-16 (×5): 100 mg via ORAL
  Filled 2020-06-13 (×5): qty 1

## 2020-06-13 MED ORDER — LIDOCAINE HCL (PF) 1 % IJ SOLN
INTRAMUSCULAR | Status: AC | PRN
  Administered 2020-06-13 (×2): 5 mL

## 2020-06-13 MED ORDER — LACTATED RINGERS IV SOLN: INTRAVENOUS | Status: DC

## 2020-06-13 MED ORDER — IOHEXOL 300 MG/ML  SOLN
INTRAMUSCULAR | Status: DC | PRN
  Administered 2020-06-13: 25 mL

## 2020-06-13 MED ORDER — STERILE WATER FOR IRRIGATION IR SOLN
Status: DC | PRN
  Administered 2020-06-13: 500 mL

## 2020-06-13 MED ORDER — PROPOFOL 10 MG/ML IV BOLUS
INTRAVENOUS | Status: AC
  Filled 2020-06-13: qty 20

## 2020-06-13 MED ORDER — EPHEDRINE SULFATE-NACL 50-0.9 MG/10ML-% IV SOSY
PREFILLED_SYRINGE | INTRAVENOUS | Status: DC | PRN
  Administered 2020-06-13: 5 mg via INTRAVENOUS

## 2020-06-13 MED ORDER — DIPHENHYDRAMINE HCL 50 MG/ML IJ SOLN
12.5000 mg | Freq: Four times a day (QID) | INTRAMUSCULAR | Status: DC | PRN
Start: 2020-06-13 — End: 2020-06-16

## 2020-06-13 MED ORDER — MIDAZOLAM HCL 2 MG/2ML IJ SOLN
INTRAMUSCULAR | Status: DC | PRN
  Administered 2020-06-13 (×2): 1 mg via INTRAVENOUS

## 2020-06-13 MED ORDER — DIPHENHYDRAMINE HCL 12.5 MG/5ML PO ELIX: 12.5000 mg | ORAL_SOLUTION | Freq: Four times a day (QID) | ORAL | Status: DC | PRN

## 2020-06-13 MED ORDER — EPHEDRINE 5 MG/ML INJ
INTRAVENOUS | Status: AC
  Filled 2020-06-13: qty 10

## 2020-06-13 MED ORDER — DEXAMETHASONE SODIUM PHOSPHATE 10 MG/ML IJ SOLN
INTRAMUSCULAR | Status: DC | PRN
  Administered 2020-06-13: 4 mg via INTRAVENOUS

## 2020-06-13 MED ORDER — OXYCODONE HCL 5 MG/5ML PO SOLN: 5.0000 mg | Freq: Once | ORAL | Status: DC | PRN

## 2020-06-13 MED ORDER — ONDANSETRON HCL 4 MG/2ML IJ SOLN: 4.0000 mg | INTRAMUSCULAR | Status: DC | PRN

## 2020-06-13 MED ORDER — LACTATED RINGERS IV BOLUS: 500.0000 mL | Freq: Once | INTRAVENOUS | Status: DC

## 2020-06-13 MED ORDER — MIDAZOLAM HCL 2 MG/2ML IJ SOLN
INTRAMUSCULAR | Status: AC | PRN
  Administered 2020-06-13 (×2): 1 mg via INTRAVENOUS

## 2020-06-13 MED ORDER — OXYBUTYNIN CHLORIDE ER 5 MG PO TB24
10.0000 mg | ORAL_TABLET | Freq: Every day | ORAL | Status: DC
  Administered 2020-06-13 – 2020-06-16 (×4): 10 mg via ORAL
  Filled 2020-06-13 (×4): qty 2

## 2020-06-13 MED ORDER — ROCURONIUM BROMIDE 10 MG/ML (PF) SYRINGE
PREFILLED_SYRINGE | INTRAVENOUS | Status: DC | PRN
  Administered 2020-06-13 (×2): 10 mg via INTRAVENOUS
  Administered 2020-06-13: 50 mg via INTRAVENOUS

## 2020-06-13 SURGICAL SUPPLY — 52 items
ADAPTER GOLDBERG URETERAL (ADAPTER) IMPLANT
BAG URINE DRAIN 2000ML AR STRL (UROLOGICAL SUPPLIES) IMPLANT
BASKET LASER NITINOL 1.9FR (BASKET) IMPLANT
BASKET STONE NITINOL 3FRX115MB (UROLOGICAL SUPPLIES) IMPLANT
BENZOIN TINCTURE PRP APPL 2/3 (GAUZE/BANDAGES/DRESSINGS) ×2 IMPLANT
BLADE SURG 15 STRL LF DISP TIS (BLADE) ×1 IMPLANT
BLADE SURG 15 STRL SS (BLADE) ×1
BOOTIES KNEE HIGH SLOAN (MISCELLANEOUS) ×2 IMPLANT
CATH FOLEY 2W COUNCIL 20FR 5CC (CATHETERS) ×2 IMPLANT
CATH FOLEY 2WAY SLVR  5CC 18FR (CATHETERS)
CATH FOLEY 2WAY SLVR 5CC 18FR (CATHETERS) IMPLANT
CATH ROBINSON RED A/P 20FR (CATHETERS) IMPLANT
CATH URET 5FR 28IN OPEN ENDED (CATHETERS) IMPLANT
CATH URET DUAL LUMEN 6-10FR 50 (CATHETERS) ×2 IMPLANT
CATH UROLOGY TORQUE 40 (MISCELLANEOUS) IMPLANT
CATH X-FORCE N30 NEPHROSTOMY (TUBING) ×2 IMPLANT
CHLORAPREP W/TINT 26 (MISCELLANEOUS) ×2 IMPLANT
COVER SURGICAL LIGHT HANDLE (MISCELLANEOUS) IMPLANT
COVER WAND RF STERILE (DRAPES) IMPLANT
DRAPE C-ARM 42X120 X-RAY (DRAPES) ×2 IMPLANT
DRAPE LINGEMAN PERC (DRAPES) ×2 IMPLANT
DRAPE SURG IRRIG POUCH 19X23 (DRAPES) ×2 IMPLANT
DRSG PAD ABDOMINAL 8X10 ST (GAUZE/BANDAGES/DRESSINGS) ×2 IMPLANT
DRSG TEGADERM 8X12 (GAUZE/BANDAGES/DRESSINGS) ×2 IMPLANT
EXTRACTOR STONE PERC NCIRCLE (MISCELLANEOUS) ×2 IMPLANT
GAUZE SPONGE 4X4 12PLY STRL (GAUZE/BANDAGES/DRESSINGS) ×2 IMPLANT
GLOVE SURG ENC MOIS LTX SZ6.5 (GLOVE) ×2 IMPLANT
GOWN STRL REUS W/TWL LRG LVL3 (GOWN DISPOSABLE) ×2 IMPLANT
GUIDEWIRE AMPLAZ .035X145 (WIRE) ×2 IMPLANT
GUIDEWIRE STR DUAL SENSOR (WIRE) ×2 IMPLANT
KIT BASIN OR (CUSTOM PROCEDURE TRAY) ×2 IMPLANT
KIT PROBE 340X3.4XDISP GRN (MISCELLANEOUS) IMPLANT
KIT PROBE TRILOGY 3.4X340 (MISCELLANEOUS)
KIT PROBE TRILOGY 3.9X350 (MISCELLANEOUS) IMPLANT
KIT TURNOVER KIT A (KITS) ×2 IMPLANT
LASER FIB FLEXIVA PULSE ID 365 (Laser) ×2 IMPLANT
MANIFOLD NEPTUNE II (INSTRUMENTS) ×2 IMPLANT
NS IRRIG 1000ML POUR BTL (IV SOLUTION) ×2 IMPLANT
PACK CYSTO (CUSTOM PROCEDURE TRAY) ×2 IMPLANT
SPONGE LAP 4X18 RFD (DISPOSABLE) ×2 IMPLANT
STENT URET 6FRX28 CONTOUR (STENTS) ×2 IMPLANT
SUT SILK 2 0 30  PSL (SUTURE)
SUT SILK 2 0 30 PSL (SUTURE) IMPLANT
SYR 10ML ECCENTRIC (SYRINGE) ×2 IMPLANT
SYR 20ML LL LF (SYRINGE) ×2 IMPLANT
TOWEL OR 17X26 10 PK STRL BLUE (TOWEL DISPOSABLE) ×2 IMPLANT
TRACTIP FLEXIVA PULS ID 200XHI (Laser) ×1 IMPLANT
TRACTIP FLEXIVA PULSE ID 200 (Laser) ×1
TRAY FOLEY MTR SLVR 16FR STAT (SET/KITS/TRAYS/PACK) ×2 IMPLANT
TUBING CONNECTING 10 (TUBING) ×4 IMPLANT
TUBING STONE CATCHER TRILOGY (MISCELLANEOUS) IMPLANT
TUBING UROLOGY SET (TUBING) ×2 IMPLANT

## 2020-06-13 NOTE — Procedures (Signed)
Interventional Radiology Procedure Note  Procedure: LEFT PCN ACCESS FOR PCNL    Complications: None  Estimated Blood Loss:  MIN  Findings: 5FR ACCESS THRU TO THE BLADDER    Tamera Punt, MD

## 2020-06-13 NOTE — Interval H&P Note (Signed)
History and Physical Interval Note: Patient is asymptomatic and last UA negative for infection. Will send urine from PCN tube to UA.   06/13/2020 10:59 AM  Glenn Bender  has presented today for surgery, with the diagnosis of LEFT RENAL CALCULI.  The various methods of treatment have been discussed with the patient and family. After consideration of risks, benefits and other options for treatment, the patient has consented to  Procedure(s) with comments: NEPHROLITHOTOMY PERCUTANEOUS (Left) - 2 HRS HOLMIUM LASER APPLICATION (Left) as a surgical intervention.  The patient's history has been reviewed, patient examined, no change in status, stable for surgery.  I have reviewed the patient's chart and labs.  Questions were answered to the patient's satisfaction.     Samir Ishaq D Eithel Ryall

## 2020-06-13 NOTE — Consult Note (Signed)
Chief Complaint: Patient was seen in consultation today for left percutaneous nephrostomy/nephroureteral catheter placement  Referring Physician(s): Pace,M  Supervising Physician: Daryll Brod  Patient Status: [facility] - Out-pt  TBA  History of Present Illness: Glenn Bender is a 59 y.o. male with history of metastatic carcinoid tumor diagnosed in September 2021, remote testicular cancer diagnosed in 1996 with prior orchiectomy and chemotherapy, renal insufficiency and 2 cm left renal calculus.  He presents today for left percutaneous nephrostomy/nephroureteral cath placement prior to nephrolithotomy.  Past Medical History:  Diagnosis Date  . Arthritis    Rt foot  . History of COVID-19 2020   hospitalized for 1 month  . History of kidney stones   . History of testicular cancer 1996  . Malignant carcinoid tumor of the ileum ([place])   . 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 Secondary malignant carcinoid tumor of liver [facility])     Past Surgical History:  Procedure Laterality Date  . CYSTOTOMY  01/2020  . LYMPH NODE BIOPSY Left    supraclavicular  . ORCHIECTOMY  1996  . UMBILICAL HERNIA REPAIR      Allergies: Patient has no known allergies.  Medications: Prior to Admission medications   Medication Sig Start Date End Date Taking? Authorizing Provider  Ascorbic Acid (VITAMIN C WITH ROSE HIPS) 500 MG tablet Take 500 mg by mouth 3 (three) times a week.   Yes [provider]  Biotin 5000 MCG TABS Take 5,000 mcg by mouth 3 (three) times a week.   Yes [provider]  Cholecalciferol (VITAMIN D-3) 125 MCG (5000 UT) TABS Take 5,000 Units by mouth 3 (three) times a week.   Yes [provider]  octreotide (SANDOSTATIN LAR) 20 MG injection Inject 20 mg into the muscle every 28 (twenty-eight) days.   Yes [provider]  OVER THE COUNTER MEDICATION Take 1 tablet by mouth 3 (three) times a  week. Beet root   Yes [provider]  zinc sulfate 220 (50 Zn) MG capsule Take 50 mg by mouth daily.   Yes [provider]     Family History  Problem Relation Age of Onset  . Cancer Mother        jaw bone  . Throat cancer Father        smoker    Social History   Socioeconomic History  . Marital status: Married    Spouse name: Not on file  . Number of children: 1  . Years of education: Not on file  . Highest education level: Not on file  Occupational History  . Not on file  Tobacco Use  . Smoking status: Former Smoker    Packs/day: 0.25    Years: 19.00    Pack years: 4.75    Quit date: 1997    Years since quitting: 25.1  . Smokeless tobacco: Never Used  Vaping Use  . Vaping Use: Never used  Substance and Sexual Activity  . Alcohol use: Yes    Comment: few beers only on days off  . Drug use: Never  . Sexual activity: Not Currently  Other Topics Concern  . Not on file  Social History Narrative  . Not on file   Social Determinants of Health   Financial Resource Strain: Not on file  Food Insecurity: Not on file  Transportation Needs: Not on file  Physical Activity: Not on file  Stress: Not on file  Social Connections: Not on  file     Review of Systems denies fever, headache, chest pain, dyspnea, cough, pain, nausea, vomiting or bleeding.  He has previously had some hematuria.  Vital Signs: Blood pressure 188/80, temp 97.9, rate 67, respirations 18, O2 sat 99 percent room air  Ht 5\' 11"  (1.803 m)   Wt 240 lb (108.9 kg)   BMI 33.47 kg/m   Physical Exam awake, alert.  Chest clear to auscultation bilaterally.  Heart with regular rate and rhythm abdomen soft, positive bowel sounds, nontender.  Trace pretibial edema bilaterally.  Imaging: No results found.  Labs:  CBC: Recent Labs    04/14/20 0000 05/30/20 0000 06/07/20 1423 06/13/20 0820  WBC 11.6 5.9 7.8 6.5  HGB 13.9 13.8 13.4 14.3  HCT 42 41 40.8 43.5  PLT 157 160 180 154     COAGS: Recent Labs    06/13/20 0820  INR 1.2    BMP: Recent Labs    02/11/20 0000 03/17/20 1536 04/14/20 0000 05/30/20 0000  NA 141 140 138 138  K 4.2 3.6 3.7 3.8  CL 109* 107 103 108  CO2 20 25 20  23*  GLUCOSE  --  142*  --   --   BUN 19 11 13 10   CALCIUM 9.0 8.4* 8.5* 8.9  CREATININE 1.5* 1.46* 1.5* 1.4*  GFRNONAA  --  54*  --   --     LIVER FUNCTION TESTS: Recent Labs    02/11/20 0000 03/17/20 1536 04/14/20 0000 05/30/20 0000  BILITOT  --  0.6  --   --   AST 29 19 31   --   ALT 31 23 41*  --   ALKPHOS 65 50 54  --   PROT  --  6.3*  --   --   ALBUMIN 4.4 4.1 3.8 4.2    TUMOR MARKERS: No results for input(s): AFPTM, CEA, CA199, CHROMGRNA in the last 8760 hours.  Assessment and Plan: 60 y.o. male with history of metastatic carcinoid tumor diagnosed in September 2021, remote testicular cancer diagnosed in 1996 with prior orchiectomy and chemotherapy, renal insufficiency and 2 cm left renal calculus.  He presents today for left percutaneous nephrostomy/nephroureteral cath placement prior to nephrolithotomy.Risks and benefits of left PCN placement was discussed with the patient including, but not limited to, infection, bleeding, significant bleeding causing loss or decrease in renal function or damage to adjacent structures.   All of the patient's questions were answered, patient is agreeable to proceed.  Consent signed and in chart.      Thank you for this interesting consult.  I greatly enjoyed meeting Glenn Bender and look forward to participating in their care.  A copy of this report was sent to the requesting provider on this date.  Electronically Signed: D. Rowe Robert, PA-C 06/13/2020, 8:47 AM   I spent a total of 25 minutes    in face to face in clinical consultation, greater than 50% of which was counseling/coordinating care for left percutaneous nephrostomy/nephroureteral catheter placement

## 2020-06-13 NOTE — Anesthesia Preprocedure Evaluation (Addendum)
Anesthesia Evaluation  Patient identified by MRN, date of birth, ID band Patient awake    Reviewed: Allergy & Precautions, NPO status , Patient's Chart, lab work & pertinent test results  History of Anesthesia Complications Negative for: history of anesthetic complications  Airway Mallampati: II  TM Distance: >3 FB Neck ROM: Full    Dental  (+) Dental Advisory Given   Pulmonary former smoker,    Pulmonary exam normal        Cardiovascular negative cardio ROS Normal cardiovascular exam     Neuro/Psych negative neurological ROS  negative psych ROS   GI/Hepatic Neg liver ROS,  Carcinoid tumor of ileum, liver - receives octreotide, no surgery      Endo/Other   Obesity   Renal/GU negative Renal ROS     Musculoskeletal  (+) Arthritis ,   Abdominal   Peds  Hematology negative hematology ROS (+)   Anesthesia Other Findings Covid test negative Previous covid infection in 2020, hospitalized x 1 month   Reproductive/Obstetrics                            Anesthesia Physical Anesthesia Plan  ASA: III  Anesthesia Plan: General   Post-op Pain Management:    Induction: Intravenous  PONV Risk Score and Plan: 2 and Treatment may vary due to age or medical condition, Ondansetron, Dexamethasone and Midazolam  Airway Management Planned: Oral ETT  Additional Equipment: None  Intra-op Plan:   Post-operative Plan: Extubation in OR  Informed Consent: I have reviewed the patients History and Physical, chart, labs and discussed the procedure including the risks, benefits and alternatives for the proposed anesthesia with the patient or authorized representative who has indicated his/her understanding and acceptance.     Dental advisory given  Plan Discussed with: CRNA and Anesthesiologist  Anesthesia Plan Comments:        Anesthesia Quick Evaluation

## 2020-06-13 NOTE — Op Note (Signed)
Operative Note  Preoperative diagnosis:  1. Left Renal calculus  Postoperative diagnosis: 1. Left Renal calculus   Procedure(s): 1. LEFT Percutaneous nephrolithotomy  Surgeon: Jacalyn Lefevre, MD  Assistants: none  Anesthesia: General  Complications: None immediate  EBL: 50 cc  Specimens: 1.  Renal calculus  Drains/Catheters: 1.  12 French Foley catheter 2.   46 [place] council tip-left nephrostomy tube 3.   6 French by 28 cm double-J ureteral stent left side  Intraoperative findings: >2cm left renal pelvis calculus  Indication: 60 year old man-year-old with a large >2cmrenal calculus presents for the previously mentioned operation.  Description of procedure:  The patient was identified and consent was obtained.  The patient was taken to the operating room and placed in the supine position.  The patient was placed under general anesthesia.  Perioperative antibiotics were administered.  The patient was placed in prone position and all pressure points were padded.  Patient was prepped and draped in a standard sterile fashion and a timeout was performed.  A Super Stiff wire was advanced through the nephroureteral stent down to the bladder under fluoroscopic guidance and the nephroureteral stent was removed.  A dual-lumen ureteral catheter was advanced over the Super Stiff wire into the renal pelvis and an antegrade nephrostogram was performed.  This showed a well opacified kidney and a filling defect corresponding to the stone of interest.  I advanced the dual-lumen ureteral catheter into the proximal ureter under fluoroscopic guidance followed by placement of a second Super Stiff wire down to the bladder under fluoroscopic guidance.  The dual-lumen catheter was removed.  An incision was made alongside the wires.  The balloon dilator was then advanced over one of the wires and into the renal pelvis fluoroscopic guidance and the tract was dilated to a pressure of 18.  The sheath was  advanced over the balloon and into the renal pelvis.  The balloon was withdrawn keeping the sheath in place.  The nephroscope was advanced into the kidney and the stone of interest was encountered.  The stone was then removed with a combination of pneumatic and ultrasound with suction.  All stone was removed and there was no evidence of any other stones within the kidney.  Next the flexible cystoscope was then placed through the sheath and the UPJ was examined.  No other stone fragments were seen.  The cystoscope was removed.   A 6 x 28 double-J ureteral stent was advanced over 1 of the wires under fluoroscopic guidance and the wire was withdrawn.  Fluoroscopy confirmed a good coil within the bladder as well as a good coil in the renal pelvis proximally.  A 20 [place] council tip catheter was advanced over the wire and 3 cc of contrast was instilled into the catheter balloon.  Nephrostogram was performed with no extravasation of contrast noted.  The council tip catheter was secured down with a silk suture.  This concluded the operation.  The patient tolerated procedure well and was stable postoperatively.  Plan: Patient will remain under observation overnight. Stent to be removed in 2 weeks.

## 2020-06-13 NOTE — Transfer of Care (Signed)
Immediate Anesthesia Transfer of Care Note  Patient: Glenn Bender  Procedure(s) Performed: NEPHROLITHOTOMY PERCUTANEOUS (Left )  Patient Location: PACU  Anesthesia Type:General  Level of Consciousness: awake, drowsy and patient cooperative  Airway & Oxygen Therapy: Patient Spontanous Breathing and Patient connected to face mask oxygen  Post-op Assessment: Report given to RN and Post -op Vital signs reviewed and stable  Post vital signs: Reviewed and stable  Last Vitals:  Vitals Value Taken Time  BP 141/84 06/13/20 1330  Temp    Pulse 73 06/13/20 1332  Resp 13 06/13/20 1332  SpO2 100 % 06/13/20 1332  Vitals shown include unvalidated device data.  Last Pain:  Vitals:   06/13/20 1115  PainSc: 0-No pain      Patients Stated Pain Goal: 2 (88/32/54 9826)  Complications: No complications documented.

## 2020-06-13 NOTE — Discharge Instructions (Signed)

## 2020-06-13 NOTE — Progress Notes (Signed)
0930 Order for UA- updated Dr. Claudia Desanctis ; ordered 500 cc bolus.  Pt. Still not able to void.   1100 informed Dr. Claudia Desanctis patient still not able to void.

## 2020-06-13 NOTE — Anesthesia Procedure Notes (Signed)
Procedure Name: Intubation Date/Time: 06/13/2020 11:41 AM Performed by: Eben Burow, CRNA Pre-anesthesia Checklist: Patient identified, Emergency Drugs available, Suction available, Patient being monitored and Timeout performed Patient Re-evaluated:Patient Re-evaluated prior to induction Oxygen Delivery Method: Circle system utilized Preoxygenation: Pre-oxygenation with 100% oxygen Induction Type: IV induction Ventilation: Mask ventilation without difficulty Laryngoscope Size: Mac and 4 Grade View: Grade II Tube type: Oral Number of attempts: 1 Airway Equipment and Method: Stylet Placement Confirmation: ETT inserted through vocal cords under direct vision,  positive ETCO2 and breath sounds checked- equal and bilateral Secured at: 22 cm Tube secured with: Tape Dental Injury: Teeth and Oropharynx as per pre-operative assessment

## 2020-06-14 ENCOUNTER — Encounter (HOSPITAL_COMMUNITY): Payer: Self-pay | Admitting: Urology

## 2020-06-14 ENCOUNTER — Inpatient Hospital Stay (HOSPITAL_COMMUNITY): Payer: 59

## 2020-06-14 LAB — BASIC METABOLIC PANEL
Anion gap: 10 (ref 5–15)
BUN: 15 mg/dL (ref 8–23)
CO2: 24 mmol/L (ref 22–32)
Calcium: 8.1 mg/dL — ABNORMAL LOW (ref 8.9–10.3)
Chloride: 106 mmol/L (ref 98–111)
Creatinine, Ser: 1.45 mg/dL — ABNORMAL HIGH (ref 0.61–1.24)
GFR, Estimated: 55 mL/min — ABNORMAL LOW (ref >60)
Glucose, Bld: 148 mg/dL — ABNORMAL HIGH (ref 70–99)
Potassium: 4.3 mmol/L (ref 3.5–5.1)
Sodium: 140 mmol/L (ref 135–145)

## 2020-06-14 LAB — CBC
HCT: 40.3 % (ref 39.0–52.0)
Hemoglobin: 12.4 g/dL — ABNORMAL LOW (ref 13.0–17.0)
MCH: 28.5 pg (ref 26.0–34.0)
MCHC: 30.8 g/dL (ref 30.0–36.0)
MCV: 92.6 fL (ref 80.0–100.0)
Platelets: 158 10*3/uL (ref 150–400)
RBC: 4.35 MIL/uL (ref 4.22–5.81)
RDW: 14.1 % (ref 11.5–15.5)
WBC: 12.6 10*3/uL — ABNORMAL HIGH (ref 4.0–10.5)
nRBC: 0 % (ref 0.0–0.2)

## 2020-06-14 LAB — URINE CULTURE: Culture: NO GROWTH

## 2020-06-14 MED ORDER — SODIUM CHLORIDE 0.9 % IV SOLN
2.0000 g | Freq: Once | INTRAVENOUS | Status: DC
  Filled 2020-06-14: qty 20

## 2020-06-14 MED ORDER — SODIUM CHLORIDE 0.9 % IV SOLN: 2.0000 g | INTRAVENOUS | Status: DC

## 2020-06-14 NOTE — Progress Notes (Signed)
Urology Inpatient Progress Report    Intv/Subj: No overnight events.  Patient tolerating general diet and pain well controlled.  Active Problems:   Renal calculus  Current Facility-Administered Medications  Medication Dose Route Frequency Provider Last Rate Last Admin  . 0.9 %  sodium chloride infusion   Intravenous Continuous Jacalyn Lefevre D, MD 50 mL/hr at 06/13/20 1527 New Bag at 06/13/20 1527  . acetaminophen (TYLENOL) tablet 1,000 mg  1,000 mg Oral Q6H Jacalyn Lefevre D, MD   1,000 mg at 06/14/20 1610  . Chlorhexidine Gluconate Cloth 2 % PADS 6 each  6 each Topical Daily Adamariz Gillott D, MD      . diphenhydrAMINE (BENADRYL) injection 12.5-25 mg  12.5-25 mg Intravenous Q6H PRN Jacalyn Lefevre D, MD       Or  . diphenhydrAMINE (BENADRYL) 12.5 MG/5ML elixir 12.5-25 mg  12.5-25 mg Oral Q6H PRN Jacalyn Lefevre D, MD      . docusate sodium (COLACE) capsule 100 mg  100 mg Oral BID Jacalyn Lefevre D, MD   100 mg at 06/13/20 2157  . neomycin-bacitracin-polymyxin (NEOSPORIN) ointment packet 1 application  1 application Topical TID PRN Jacalyn Lefevre D, MD      . ondansetron (ZOFRAN) injection 4 mg  4 mg Intravenous Q4H PRN Jacalyn Lefevre D, MD      . oxybutynin (DITROPAN-XL) 24 hr tablet 10 mg  10 mg Oral Daily Jacalyn Lefevre D, MD   10 mg at 06/13/20 1848  . oxyCODONE (Oxy IR/ROXICODONE) immediate release tablet 5 mg  5 mg Oral Q4H PRN Jacalyn Lefevre D, MD   5 mg at 06/13/20 2155     Objective: Vital: Vitals:   06/13/20 1809 06/13/20 2229 06/14/20 0219 06/14/20 0433  BP: (!) 147/98 140/84 120/78 133/78  Pulse: 65 77 65 66  Resp: 16 18 18 20   Temp: (!) 97.4 F (36.3 C) 98 F (36.7 C) 98.2 F (36.8 C) 97.8 F (36.6 C)  TempSrc: Oral Oral Oral Oral  SpO2: 99% 97% 98% 96%  Weight:      Height:       I/Os: I/O last 3 completed shifts: In: 3042.7 [P.O.:240; I.V.:2391.7; IV Piggyback:411] Out: 9604 [Urine:1800; Blood:50]  Physical Exam:  General: Patient is in no  apparent distress Lungs: Normal respiratory effort, chest expands symmetrically. L nephrostomy tube with bloody urine output (500 overnight) Foley: 16Fr foley with blood tinged urine (550 overnight) Ext: lower extremities symmetric  Lab Results: Recent Labs    06/13/20 0820 06/13/20 1409 06/14/20 0516  WBC 6.5 7.1 12.6*  HGB 14.3 13.5 12.4*  HCT 43.5 42.2 40.3   Recent Labs    06/13/20 0820 06/13/20 1409 06/14/20 0516  NA 142 140 140  K 3.7 4.2 4.3  CL 110 106 106  CO2 24 26 24   GLUCOSE 129* 150* 148*  BUN 14 12 15   CREATININE 1.46* 1.43* 1.45*  CALCIUM 8.6* 8.2* 8.1*   Recent Labs    06/13/20 0820  INR 1.2   No results for input(s): LABURIN in the last 72 hours. Results for orders placed or performed during the hospital encounter of 06/09/20  SARS CORONAVIRUS 2 (TAT 6-24 HRS) Nasopharyngeal Nasopharyngeal Swab     Status: None   Collection Time: 06/09/20 11:23 AM   Specimen: Nasopharyngeal Swab  Result Value Ref Range Status   SARS Coronavirus 2 NEGATIVE NEGATIVE Final    Comment: (NOTE) SARS-CoV-2 target nucleic acids are NOT DETECTED.  The SARS-CoV-2 RNA is generally detectable in upper and lower  respiratory specimens during the acute phase of infection. Negative results do not preclude SARS-CoV-2 infection, do not rule out co-infections with other pathogens, and should not be used as the sole basis for treatment or other patient management decisions. Negative results must be combined with clinical observations, patient history, and epidemiological information. The expected result is Negative.  Fact Sheet for Patients: SugarRoll.be  Fact Sheet for Healthcare Providers: https://www.woods-mathews.com/  This test is not yet approved or cleared by the [place] FDA and  has been authorized for detection and/or diagnosis of SARS-CoV-2 by FDA under an Emergency Use Authorization (EUA). This EUA will remain  in  effect (meaning this test can be used) for the duration of the COVID-19 declaration under Se ction 564(b)(1) of the Act, 21 U.S.C. section 360bbb-3(b)(1), unless the authorization is terminated or revoked sooner.  Performed at [facility], [place] [address], [place], [place] [address]     Studies/Results: DG C-Arm 1-60 Min-No Report  Result Date: 06/13/2020 Fluoroscopy was utilized by the requesting physician.  No radiographic interpretation.   IR URETERAL STENT LEFT NEW ACCESS W/O SEP NEPHROSTOMY CATH  Result Date: 06/13/2020 INDICATION: Left staghorn calculus EXAM: Ultrasound fluoroscopic left nephrostomy access for operative nephrolithotomy COMPARISON:  05/30/2020 MEDICATIONS: Rocephin 2 g; The antibiotic was administered in an appropriate time frame prior to skin puncture. ANESTHESIA/SEDATION: Fentanyl 100 mcg IV; Versed 2.0 mg IV Moderate Sedation Time:  13 minutes The patient was continuously monitored during the procedure by the interventional radiology nurse under my direct supervision. CONTRAST:  50 cc omni 300-administered into the collecting system(s) FLUOROSCOPY TIME:  Fluoroscopy Time: 3 minutes 0 seconds (44 mGy). COMPLICATIONS: None immediate. PROCEDURE: Informed written consent was obtained from the patient after a thorough discussion of the procedural risks, benefits and alternatives. All questions were addressed. Maximal Sterile Barrier Technique was utilized including caps, mask, sterile gowns, sterile gloves, sterile drape, hand hygiene and skin antiseptic. A timeout was performed prior to the initiation of the procedure. Previous imaging reviewed. Under sterile conditions and local anesthesia, ultrasound percutaneous needle access performed of a mid to lower pole calyx containing a portion of the stone. Stone was felt on the needle. Contrast injection confirms calyceal position. 018 guidewire coiled within the renal pelvis adjacent to the stone. Micro dilator set advanced.  Bentson guidewire advanced into the upper pole calyx. Kumpe catheter inserted. Kumpe catheter and Glidewire utilized to manipulate the access past the stone into the ureter. Catheter guidewire access advanced into the bladder. Contrast injection confirms position. Images obtained for documentation. Catheter secured with a bulky dressing externally. Patient tolerated the procedure well. No immediate complication. IMPRESSION: Successful left nephrostomy access for operative nephrolithotomy Electronically Signed   By: Jerilynn Mages.  Shick M.D.   On: 06/13/2020 10:41    Assessment: 60 yo man with large left renal calculus > 2 cm POD1 s/p LEFT PCNL doing well.  -DC Foley catheter -Nephrostomy tube -KUB to ensure proper position of stent and evaluate for residual stone fragments -P.o. pain meds -SCDs at all time -Antibiotics x2 doses postop -Anticipated discharge this afternoon  Jacalyn Lefevre, MD Urology 06/14/2020, 8:21 AM

## 2020-06-14 NOTE — Progress Notes (Signed)
Referring Physician(s): Pace,M  Supervising Physician: Arne Cleveland  Patient Status:  [facility] - In-pt  Chief Complaint:  Left flank pain, renal stone  Subjective: Glenn Bender is a 60 y.o. male with history of metastatic carcinoid tumor diagnosed in September 2021, remote testicular cancer diagnosed in 1996 with prior orchiectomy and chemotherapy, renal insufficiency and 2 cm left renal calculus.  He is status post left percutaneous nephrostomy yesterday followed by nephrolithotomy.  He also underwent 15 [place] council tip left nephrostomy tube placement as well as a 6 [place] by 28 cm double-J left  ureteral stent.  His Foley catheter was removed today.  He still has a small amount of blood-tinged urine in voiding.  Also with some mild left flank discomfort.  He denies fever, headache, chest pain, dyspnea, cough, abdominal pain, nausea, vomiting.  On CT abdomen pelvis today the distal loop of the ureteral stent is in the ureter proximal to the urinary bladder.  3 tiny stone fragments are seen in the left kidney.  Request now received from urology for left ureteral stent exchange.  Past Medical History:  Diagnosis Date  . Arthritis    Rt foot  . History of COVID-19 2020   hospitalized for 1 month  . History of kidney stones   . History of testicular cancer 1996  . Malignant carcinoid tumor of the ileum ([place])   . 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 Malignant nonargentaffin carcinoid tumor ([place])   . Secondary malignant carcinoid tumor of liver [facility])    Past Surgical History:  Procedure Laterality Date  . CYSTOTOMY  01/2020  . IR URETERAL STENT LEFT NEW ACCESS W/O SEP NEPHROSTOMY CATH  06/13/2020  . LYMPH NODE BIOPSY Left    supraclavicular  . NEPHROLITHOTOMY Left 06/13/2020   Procedure: NEPHROLITHOTOMY PERCUTANEOUS;  Surgeon: Robley Fries, MD;  Location: [facility] ORS;  Service: Urology;  Laterality: Left;  2 HRS  . ORCHIECTOMY  1996  . UMBILICAL HERNIA REPAIR          Allergies: Patient has no known allergies.  Medications: Prior to Admission medications   Medication Sig Start Date End Date Taking? Authorizing Provider  Ascorbic Acid (VITAMIN C WITH ROSE HIPS) 500 MG tablet Take 500 mg by mouth 3 (three) times a week.   Yes [provider]  Biotin 5000 MCG TABS Take 5,000 mcg by mouth 3 (three) times a week.   Yes [provider]  Cholecalciferol (VITAMIN D-3) 125 MCG (5000 UT) TABS Take 5,000 Units by mouth 3 (three) times a week.   Yes [provider]  octreotide (SANDOSTATIN LAR) 20 MG injection Inject 20 mg into the muscle every 28 (twenty-eight) days.   Yes [provider]  OVER THE COUNTER MEDICATION Take 1 tablet by mouth 3 (three) times a week. Beet root   Yes [provider]  zinc sulfate 220 (50 Zn) MG capsule Take 50 mg by mouth daily.   Yes [provider]     Vital Signs: BP 122/75 (BP Location: Right Arm)   Pulse 65   Temp 98.4 F (36.9 C) (Oral)   Resp 18   Ht 5\' 11"  (1.803 m)   Wt 239 lb 13.8 oz (108.8 kg)   SpO2 96%   BMI 33.45 kg/m   Physical Exam awake, alert.  Chest clear to auscultation bilaterally.  Heart with regular rate and rhythm.  Abdomen soft, positive bowel sounds, nontender.  Trace pretibial edema bilaterally.  Left Council tip nephrostomy  tube in place; site mildly tender to palpation.  Imaging: CT ABDOMEN PELVIS WO CONTRAST  Result Date: 06/14/2020 CLINICAL DATA:  Patient status post left percutaneous nephrolithotomy and double-J ureteral stent placement for a left renal calculus 06/13/2020. The patient has a history of metastatic carcinoid tumor. EXAM: CT ABDOMEN AND PELVIS WITHOUT CONTRAST TECHNIQUE: Multidetector CT imaging of the abdomen and pelvis was performed following the standard protocol without IV contrast. COMPARISON:  CT chest, abdomen and pelvis 05/30/2020. FINDINGS: Lower chest: Dependent atelectasis in the lung bases. No pleural or  pericardial effusion. Hepatobiliary: Multiple mass lesions in the liver are unchanged. Fatty infiltration of the liver also noted. Gallbladder and biliary tree are negative. Pancreas: Unremarkable. No pancreatic ductal dilatation or surrounding inflammatory changes. Spleen: Normal in size without focal abnormality. Adrenals/Urinary Tract: The adrenal glands appear normal. New left nephrostomy tube and double-J ureteral stent are in place. The distal loop of the patient's ureteral stent is within the ureter, proximal to the urinary bladder. There is mild fullness of the left intrarenal collecting system. 2-3 punctate stone fragments are seen in the left intrarenal collecting system. There is some air in the urinary bladder consistent with instrumentation. The right kidney and ureter appear normal. Stomach/Bowel: Stomach is within normal limits. Appendix appears normal. No evidence of bowel wall thickening, distention, or inflammatory changes. Vascular/Lymphatic: Aortic atherosclerosis. Small perirectal lymph nodes are unchanged. Reproductive: Prostate is unremarkable. Other: Calcified mesenteric mass on the right consistent with the patient's known carcinoid tumor is unchanged in appearance. Fat containing left inguinal hernia noted. Musculoskeletal: No acute or focal abnormality. Small density in subcutaneous fat in the left buttock is unchanged may be related to prior injection. IMPRESSION: Status post left nephrolithotomy with a new nephrostomy tube and double-J ureteral stent in place. The distal loop of the stent is in the ureter proximal to the urinary bladder. 2-3 tiny stone fragments are seen in the left kidney. Mild fullness of the left intrarenal collecting system noted. No stones are seen in the left ureter. Carcinoid tumor with metastases to the liver, unchanged. Fatty infiltration of the liver. Electronically Signed   By: Inge Rise M.D.   On: 06/14/2020 14:16   DG Abd 1 View  Result Date:  06/14/2020 CLINICAL DATA:  Left-sided nephrostomy tube, question stone remnants EXAM: ABDOMEN - 1 VIEW COMPARISON:  CT 05/30/2020 FINDINGS: Left nephrostomy tube and left ureteral stent in place. Questionable 5 mm density along the left ureteral stent at the L3 level. Gas throughout mildly prominent small bowel loops and normal caliber colon. Question mild ileus. No visible free air. IMPRESSION: Left nephrostomy catheter and ureteral stent in place. Questionable 5 mm density within the mid left ureter which could reflect a left ureteral stone. Electronically Signed   By: Rolm Baptise M.D.   On: 06/14/2020 09:19   DG C-Arm 1-60 Min-No Report  Result Date: 06/13/2020 Fluoroscopy was utilized by the requesting physician.  No radiographic interpretation.   IR URETERAL STENT LEFT NEW ACCESS W/O SEP NEPHROSTOMY CATH  Result Date: 06/13/2020 INDICATION: Left staghorn calculus EXAM: Ultrasound fluoroscopic left nephrostomy access for operative nephrolithotomy COMPARISON:  05/30/2020 MEDICATIONS: Rocephin 2 g; The antibiotic was administered in an appropriate time frame prior to skin puncture. ANESTHESIA/SEDATION: Fentanyl 100 mcg IV; Versed 2.0 mg IV Moderate Sedation Time:  13 minutes The patient was continuously monitored during the procedure by the interventional radiology nurse under my direct supervision. CONTRAST:  50 cc omni 300-administered into the collecting system(s) FLUOROSCOPY TIME:  Fluoroscopy  Time: 3 minutes 0 seconds (44 mGy). COMPLICATIONS: None immediate. PROCEDURE: Informed written consent was obtained from the patient after a thorough discussion of the procedural risks, benefits and alternatives. All questions were addressed. Maximal Sterile Barrier Technique was utilized including caps, mask, sterile gowns, sterile gloves, sterile drape, hand hygiene and skin antiseptic. A timeout was performed prior to the initiation of the procedure. Previous imaging reviewed. Under sterile conditions and  local anesthesia, ultrasound percutaneous needle access performed of a mid to lower pole calyx containing a portion of the stone. Stone was felt on the needle. Contrast injection confirms calyceal position. 018 guidewire coiled within the renal pelvis adjacent to the stone. Micro dilator set advanced. Bentson guidewire advanced into the upper pole calyx. Kumpe catheter inserted. Kumpe catheter and Glidewire utilized to manipulate the access past the stone into the ureter. Catheter guidewire access advanced into the bladder. Contrast injection confirms position. Images obtained for documentation. Catheter secured with a bulky dressing externally. Patient tolerated the procedure well. No immediate complication. IMPRESSION: Successful left nephrostomy access for operative nephrolithotomy Electronically Signed   By: Jerilynn Mages.  Shick M.D.   On: 06/13/2020 10:41    Labs:  CBC: Recent Labs    06/07/20 1423 06/13/20 0820 06/13/20 1409 06/14/20 0516  WBC 7.8 6.5 7.1 12.6*  HGB 13.4 14.3 13.5 12.4*  HCT 40.8 43.5 42.2 40.3  PLT 180 154 145* 158    COAGS: Recent Labs    06/13/20 0820  INR 1.2    BMP: Recent Labs    03/17/20 1536 04/14/20 0000 05/30/20 0000 06/13/20 0820 06/13/20 1409 06/14/20 0516  NA 140   < > 138 142 140 140  K 3.6   < > 3.8 3.7 4.2 4.3  CL 107   < > 108 110 106 106  CO2 25   < > 23* 24 26 24   GLUCOSE 142*  --   --  129* 150* 148*  BUN 11   < > 10 14 12 15   CALCIUM 8.4*   < > 8.9 8.6* 8.2* 8.1*  CREATININE 1.46*   < > 1.4* 1.46* 1.43* 1.45*  GFRNONAA 54*  --   --  54* 56* 55*   < > = values in this interval not displayed.    LIVER FUNCTION TESTS: Recent Labs    02/11/20 0000 03/17/20 1536 04/14/20 0000 05/30/20 0000  BILITOT  --  0.6  --   --   AST 29 19 31   --   ALT 31 23 41*  --   ALKPHOS 65 50 54  --   PROT  --  6.3*  --   --   ALBUMIN 4.4 4.1 3.8 4.2    Assessment and Plan: Glenn Bender is a 60 y.o. male with history of metastatic carcinoid tumor  diagnosed in September 2021, remote testicular cancer diagnosed in 1996 with prior orchiectomy and chemotherapy, renal insufficiency and 2 cm left renal calculus.  He is status post left percutaneous nephrostomy yesterday followed by nephrolithotomy.  He also underwent 38 [place] council tip left nephrostomy tube placement as well as a 6 [place] by 28 cm double-J left  ureteral stent.  His Foley catheter was removed today.  He still has a small amount of blood-tinged urine in voiding.  Also with some mild left flank discomfort.  He denies fever, headache, chest pain, dyspnea, cough, abdominal pain, nausea, vomiting.  On CT abdomen pelvis today the distal loop of the ureteral stent is in the ureter proximal to  the urinary bladder.  3 tiny stone fragments are seen in the left kidney.  Request now received from urology for left ureteral stent exchange.  Details/risks of procedure, including but not limited to, internal bleeding, infection, injury to adjacent structures, discussed with patient with his understanding and consent.  Procedure scheduled for 2/17.   Electronically Signed: D. Rowe Robert, PA-C 06/14/2020, 2:26 PM   I spent a total of 20 minutes at the the patient's bedside AND on the patient's hospital floor or unit, greater than 50% of which was counseling/coordinating care for left nephrostogram with ureteral stent exchange    Patient ID: Jeferson Boozer, male   DOB: [birth year], 60 y.o.   MRN: [id number]

## 2020-06-14 NOTE — Plan of Care (Signed)
  Problem: Health Behavior/Discharge Planning: Goal: Ability to manage health-related needs will improve Outcome: Progressing   Problem: Pain Managment: Goal: General experience of comfort will improve Outcome: Progressing   

## 2020-06-14 NOTE — Anesthesia Postprocedure Evaluation (Signed)
Anesthesia Post Note  Patient: Glenn Bender  Procedure(s) Performed: NEPHROLITHOTOMY PERCUTANEOUS (Left )     Patient location during evaluation: PACU Anesthesia Type: General Level of consciousness: awake and alert Pain management: pain level controlled Vital Signs Assessment: post-procedure vital signs reviewed and stable Respiratory status: spontaneous breathing, nonlabored ventilation and respiratory function stable Cardiovascular status: blood pressure returned to baseline and stable Postop Assessment: no apparent nausea or vomiting Anesthetic complications: no   No complications documented.  Last Vitals:  Vitals:   06/14/20 0433 06/14/20 0824  BP: 133/78 111/74  Pulse: 66 66  Resp: 20 18  Temp: 36.6 C 36.7 C  SpO2: 96% 96%    Last Pain:  Vitals:   06/14/20 0824  TempSrc: Oral  PainSc:                  Audry Pili

## 2020-06-15 ENCOUNTER — Inpatient Hospital Stay (HOSPITAL_COMMUNITY): Payer: 59

## 2020-06-15 HISTORY — PX: IR URETERAL STENT PLACEMENT EXISTING ACCESS LEFT: IMG6073

## 2020-06-15 LAB — BASIC METABOLIC PANEL
Anion gap: 5 (ref 5–15)
BUN: 17 mg/dL (ref 8–23)
CO2: 28 mmol/L (ref 22–32)
Calcium: 8.2 mg/dL — ABNORMAL LOW (ref 8.9–10.3)
Chloride: 106 mmol/L (ref 98–111)
Creatinine, Ser: 1.54 mg/dL — ABNORMAL HIGH (ref 0.61–1.24)
GFR, Estimated: 51 mL/min — ABNORMAL LOW (ref >60)
Glucose, Bld: 122 mg/dL — ABNORMAL HIGH (ref 70–99)
Potassium: 3.8 mmol/L (ref 3.5–5.1)
Sodium: 139 mmol/L (ref 135–145)

## 2020-06-15 LAB — CBC WITH DIFFERENTIAL/PLATELET
Abs Immature Granulocytes: 0.04 10*3/uL (ref 0.00–0.07)
Basophils Absolute: 0.1 10*3/uL (ref 0.0–0.1)
Basophils Relative: 1 %
Eosinophils Absolute: 0.1 10*3/uL (ref 0.0–0.5)
Eosinophils Relative: 1 %
HCT: 38.4 % — ABNORMAL LOW (ref 39.0–52.0)
Hemoglobin: 12 g/dL — ABNORMAL LOW (ref 13.0–17.0)
Immature Granulocytes: 0 %
Lymphocytes Relative: 13 %
Lymphs Abs: 1.1 10*3/uL (ref 0.7–4.0)
MCH: 28.6 pg (ref 26.0–34.0)
MCHC: 31.3 g/dL (ref 30.0–36.0)
MCV: 91.4 fL (ref 80.0–100.0)
Monocytes Absolute: 0.7 10*3/uL (ref 0.1–1.0)
Monocytes Relative: 8 %
Neutro Abs: 6.9 10*3/uL (ref 1.7–7.7)
Neutrophils Relative %: 77 %
Platelets: 128 10*3/uL — ABNORMAL LOW (ref 150–400)
RBC: 4.2 MIL/uL — ABNORMAL LOW (ref 4.22–5.81)
RDW: 14.4 % (ref 11.5–15.5)
WBC: 9 10*3/uL (ref 4.0–10.5)
nRBC: 0 % (ref 0.0–0.2)

## 2020-06-15 MED ORDER — MIDAZOLAM HCL 2 MG/2ML IJ SOLN
INTRAMUSCULAR | Status: AC
  Filled 2020-06-15: qty 2

## 2020-06-15 MED ORDER — FENTANYL CITRATE (PF) 100 MCG/2ML IJ SOLN
INTRAMUSCULAR | Status: AC
  Filled 2020-06-15: qty 2

## 2020-06-15 MED ORDER — FENTANYL CITRATE (PF) 100 MCG/2ML IJ SOLN
INTRAMUSCULAR | Status: AC | PRN
  Administered 2020-06-15 (×4): 50 ug via INTRAVENOUS

## 2020-06-15 MED ORDER — OXYCODONE HCL 5 MG PO TABS: 5.0000 mg | ORAL_TABLET | ORAL | 0 refills | Status: DC | PRN

## 2020-06-15 MED ORDER — MIDAZOLAM HCL 2 MG/2ML IJ SOLN
INTRAMUSCULAR | Status: AC | PRN
  Administered 2020-06-15: 1 mg via INTRAVENOUS
  Administered 2020-06-15: 0.5 mg via INTRAVENOUS
  Administered 2020-06-15: 1 mg via INTRAVENOUS

## 2020-06-15 MED ORDER — IOHEXOL 300 MG/ML  SOLN
50.0000 mL | Freq: Once | INTRAMUSCULAR | Status: AC | PRN
  Administered 2020-06-15: 25 mL

## 2020-06-15 MED ORDER — LIDOCAINE HCL (PF) 1 % IJ SOLN
INTRAMUSCULAR | Status: AC | PRN
  Administered 2020-06-15: 10 mL via INTRADERMAL

## 2020-06-15 MED ORDER — LIDOCAINE HCL 1 % IJ SOLN
INTRAMUSCULAR | Status: AC
  Filled 2020-06-15: qty 20

## 2020-06-15 NOTE — Progress Notes (Signed)
Urology Inpatient Progress Report  Renal calculus [N20.0]  Procedure(s): NEPHROLITHOTOMY PERCUTANEOUS  2 Days Post-Op   Intv/Subj: No overnight events.  Ureteral stent not completely in bladder - plan for replacement by IR today.   Active Problems:   Renal calculus  Current Facility-Administered Medications  Medication Dose Route Frequency Provider Last Rate Last Admin  . 0.9 %  sodium chloride infusion   Intravenous Continuous Jacalyn Lefevre D, MD 50 mL/hr at 06/14/20 1136 New Bag at 06/14/20 1136  . cefTRIAXone (ROCEPHIN) 2 g in sodium chloride 0.9 % 100 mL IVPB  2 g Intravenous Once Allred, Darrell K, PA-C      . Chlorhexidine Gluconate Cloth 2 % PADS 6 each  6 each Topical Daily Talibah Colasurdo D, MD      . diphenhydrAMINE (BENADRYL) injection 12.5-25 mg  12.5-25 mg Intravenous Q6H PRN Jacalyn Lefevre D, MD       Or  . diphenhydrAMINE (BENADRYL) 12.5 MG/5ML elixir 12.5-25 mg  12.5-25 mg Oral Q6H PRN Jacalyn Lefevre D, MD      . docusate sodium (COLACE) capsule 100 mg  100 mg Oral BID Jacalyn Lefevre D, MD   100 mg at 06/14/20 2310  . neomycin-bacitracin-polymyxin (NEOSPORIN) ointment packet 1 application  1 application Topical TID PRN Jacalyn Lefevre D, MD      . ondansetron (ZOFRAN) injection 4 mg  4 mg Intravenous Q4H PRN Jacalyn Lefevre D, MD      . oxybutynin (DITROPAN-XL) 24 hr tablet 10 mg  10 mg Oral Daily Jacalyn Lefevre D, MD   10 mg at 06/14/20 1041  . oxyCODONE (Oxy IR/ROXICODONE) immediate release tablet 5 mg  5 mg Oral Q4H PRN Jacalyn Lefevre D, MD   5 mg at 06/14/20 2309     Objective: Vital: Vitals:   06/14/20 1232 06/14/20 1601 06/14/20 2204 06/15/20 0506  BP: 122/75 125/81 131/83 126/83  Pulse: 65 70 79 72  Resp: 18 16 16 14   Temp: 98.4 F (36.9 C) 98.3 F (36.8 C) 98.6 F (37 C) 98.3 F (36.8 C)  TempSrc: Oral Oral Oral Oral  SpO2: 96% 96% 95% 96%  Weight:      Height:       I/Os: I/O last 3 completed shifts: In: 4022.1 [P.O.:2000;  I.V.:1822.1; IV Piggyback:200] Out: 2575 [Urine:2575]  Physical Exam:  General: Patient is in no apparent distress Lungs: Normal respiratory effort, chest expands symmetrically. GI: The abdomen is soft GU: L PCN tube capped and covered with dressing Ext: lower extremities symmetric  Lab Results: Recent Labs    06/13/20 1409 06/14/20 0516 06/15/20 0454  WBC 7.1 12.6* 9.0  HGB 13.5 12.4* 12.0*  HCT 42.2 40.3 38.4*   Recent Labs    06/13/20 1409 06/14/20 0516 06/15/20 0454  NA 140 140 139  K 4.2 4.3 3.8  CL 106 106 106  CO2 26 24 28   GLUCOSE 150* 148* 122*  BUN 12 15 17   CREATININE 1.43* 1.45* 1.54*  CALCIUM 8.2* 8.1* 8.2*   Recent Labs    06/13/20 0820  INR 1.2   No results for input(s): LABURIN in the last 72 hours. Results for orders placed or performed during the hospital encounter of 06/13/20  Urine Culture     Status: None   Collection Time: 06/13/20 12:19 PM   Specimen: Urine, Catheterized  Result Value Ref Range Status   Specimen Description   Final    URINE, CATHETERIZED Performed at [facility] [address] [place], [place]  [address]    Special Requests   Final    NONE Performed at [facility], [place] [address], [place], [place] [address]    Culture   Final    NO GROWTH Performed at [facility], [place] [address] [place], [place] [address]    Report Status 06/14/2020 FINAL  Final    Studies/Results: CT ABDOMEN PELVIS WO CONTRAST  Result Date: 06/14/2020 CLINICAL DATA:  Patient status post left percutaneous nephrolithotomy and double-J ureteral stent placement for a left renal calculus 06/13/2020. The patient has a history of metastatic carcinoid tumor. EXAM: CT ABDOMEN AND PELVIS WITHOUT CONTRAST TECHNIQUE: Multidetector CT imaging of the abdomen and pelvis was performed following the standard protocol without IV contrast. COMPARISON:  CT chest, abdomen and pelvis 05/30/2020. FINDINGS: Lower chest:  Dependent atelectasis in the lung bases. No pleural or pericardial effusion. Hepatobiliary: Multiple mass lesions in the liver are unchanged. Fatty infiltration of the liver also noted. Gallbladder and biliary tree are negative. Pancreas: Unremarkable. No pancreatic ductal dilatation or surrounding inflammatory changes. Spleen: Normal in size without focal abnormality. Adrenals/Urinary Tract: The adrenal glands appear normal. New left nephrostomy tube and double-J ureteral stent are in place. The distal loop of the patient's ureteral stent is within the ureter, proximal to the urinary bladder. There is mild fullness of the left intrarenal collecting system. 2-3 punctate stone fragments are seen in the left intrarenal collecting system. There is some air in the urinary bladder consistent with instrumentation. The right kidney and ureter appear normal. Stomach/Bowel: Stomach is within normal limits. Appendix appears normal. No evidence of bowel wall thickening, distention, or inflammatory changes. Vascular/Lymphatic: Aortic atherosclerosis. Small perirectal lymph nodes are unchanged. Reproductive: Prostate is unremarkable. Other: Calcified mesenteric mass on the right consistent with the patient's known carcinoid tumor is unchanged in appearance. Fat containing left inguinal hernia noted. Musculoskeletal: No acute or focal abnormality. Small density in subcutaneous fat in the left buttock is unchanged may be related to prior injection. IMPRESSION: Status post left nephrolithotomy with a new nephrostomy tube and double-J ureteral stent in place. The distal loop of the stent is in the ureter proximal to the urinary bladder. 2-3 tiny stone fragments are seen in the left kidney. Mild fullness of the left intrarenal collecting system noted. No stones are seen in the left ureter. Carcinoid tumor with metastases to the liver, unchanged. Fatty infiltration of the liver. Electronically Signed   By: Inge Rise M.D.   On:  06/14/2020 14:16   DG Abd 1 View  Result Date: 06/14/2020 CLINICAL DATA:  Left-sided nephrostomy tube, question stone remnants EXAM: ABDOMEN - 1 VIEW COMPARISON:  CT 05/30/2020 FINDINGS: Left nephrostomy tube and left ureteral stent in place. Questionable 5 mm density along the left ureteral stent at the L3 level. Gas throughout mildly prominent small bowel loops and normal caliber colon. Question mild ileus. No visible free air. IMPRESSION: Left nephrostomy catheter and ureteral stent in place. Questionable 5 mm density within the mid left ureter which could reflect a left ureteral stone. Electronically Signed   By: Rolm Baptise M.D.   On: 06/14/2020 09:19   DG C-Arm 1-60 Min-No Report  Result Date: 06/13/2020 Fluoroscopy was utilized by the requesting physician.  No radiographic interpretation.   IR URETERAL STENT LEFT NEW ACCESS W/O SEP NEPHROSTOMY CATH  Result Date: 06/13/2020 INDICATION: Left staghorn calculus EXAM: Ultrasound fluoroscopic left nephrostomy access for operative nephrolithotomy COMPARISON:  05/30/2020 MEDICATIONS: Rocephin 2 g; The antibiotic was administered in  an appropriate time frame prior to skin puncture. ANESTHESIA/SEDATION: Fentanyl 100 mcg IV; Versed 2.0 mg IV Moderate Sedation Time:  13 minutes The patient was continuously monitored during the procedure by the interventional radiology nurse under my direct supervision. CONTRAST:  50 cc omni 300-administered into the collecting system(s) FLUOROSCOPY TIME:  Fluoroscopy Time: 3 minutes 0 seconds (44 mGy). COMPLICATIONS: None immediate. PROCEDURE: Informed written consent was obtained from the patient after a thorough discussion of the procedural risks, benefits and alternatives. All questions were addressed. Maximal Sterile Barrier Technique was utilized including caps, mask, sterile gowns, sterile gloves, sterile drape, hand hygiene and skin antiseptic. A timeout was performed prior to the initiation of the procedure. Previous  imaging reviewed. Under sterile conditions and local anesthesia, ultrasound percutaneous needle access performed of a mid to lower pole calyx containing a portion of the stone. Stone was felt on the needle. Contrast injection confirms calyceal position. 018 guidewire coiled within the renal pelvis adjacent to the stone. Micro dilator set advanced. Bentson guidewire advanced into the upper pole calyx. Kumpe catheter inserted. Kumpe catheter and Glidewire utilized to manipulate the access past the stone into the ureter. Catheter guidewire access advanced into the bladder. Contrast injection confirms position. Images obtained for documentation. Catheter secured with a bulky dressing externally. Patient tolerated the procedure well. No immediate complication. IMPRESSION: Successful left nephrostomy access for operative nephrolithotomy Electronically Signed   By: Jerilynn Mages.  Shick M.D.   On: 06/13/2020 10:41    Assessment: 60 yo man with large left renal calculus POD2 s/p PCNL doing well.  Foley has been removed and PCN capped.  -IR intervention today to replace JJ ureteral stent (nephrostomy tube can be removed at this time) -pain meds PRN -anticipated d/c after IR procedure vs tomorrow morning pending timing   Jacalyn Lefevre, MD Urology 06/15/2020, 7:36 AM

## 2020-06-15 NOTE — Plan of Care (Signed)
?  Problem: Clinical Measurements: ?Goal: Will remain free from infection ?Outcome: Progressing ?  ?Problem: Nutrition: ?Goal: Adequate nutrition will be maintained ?Outcome: Progressing ?  ?Problem: Pain Managment: ?Goal: General experience of comfort will improve ?Outcome: Progressing ?  ?

## 2020-06-16 NOTE — Discharge Summary (Signed)
Physician Discharge Summary  Patient ID: Glenn Bender MRN: [id number] DOB/AGE: 01-23-1961 60 y.o.  Admit date: 06/13/2020 Discharge date: 06/16/2020  Admission Diagnoses:  Discharge Diagnoses:  Active Problems:   Renal calculus   Discharged Condition: good  Hospital Course: Patient underwent a PCNL on the left on June 13, 2020.  He tolerated the procedure well and was stable postoperatively.  He had a KUB the following day followed by CT scan that revealed improper positioning of the double-J ureteral stent.  He therefore underwent replacement by interventional radiology on 2/17.  After repositioning of the stent, the patient remained overnight and was doing well.  He was discharged on 2/18.  He was voiding well and no longer had the nephrostomy tube or the Foley.  Consults: Interventional radiology  Significant Diagnostic Studies: As above  Treatments: surgery: As above  Discharge Exam: Blood pressure (!) 141/80, pulse 84, temperature 98.6 F (37 C), temperature source Oral, resp. rate 18, height 5\' 11"  (1.803 m), weight 108.8 kg, SpO2 94 %. General appearance: alert no acute distress Left-sided nephrostomy tube site clean dry and intact Soft, nontender, nondistended  Disposition: Discharge disposition: 01-Home or Self Care       Discharge Instructions    No wound care   Complete by: As directed      Allergies as of 06/16/2020   No Known Allergies     Medication List    TAKE these medications   Biotin 5000 MCG Tabs Take 5,000 mcg by mouth 3 (three) times a week.   octreotide 20 MG injection Commonly known as: SANDOSTATIN LAR Inject 20 mg into the muscle every 28 (twenty-eight) days.   OVER THE COUNTER MEDICATION Take 1 tablet by mouth 3 (three) times a week. Beet root   oxyCODONE 5 MG immediate release tablet Commonly known as: Oxy IR/ROXICODONE Take 1 tablet (5 mg total) by mouth every 4 (four) hours as needed for moderate pain (post kidney stone  surgery).   vitamin C with rose hips 500 MG tablet Take 500 mg by mouth 3 (three) times a week.   Vitamin D-3 125 MCG (5000 UT) Tabs Take 5,000 Units by mouth 3 (three) times a week.   zinc sulfate 220 (50 Zn) MG capsule Take 50 mg by mouth daily.       Follow-up Information    [facility].   Contact information: [place] [phone]              Signed: Marton Redwood, III 06/16/2020, 1:20 PM

## 2020-06-16 NOTE — Procedures (Signed)
  Procedure: Exchange/reposition L JJ stent  26cm 8.53f EBL:   minimal Complications:  none immediate  See full dictation in [facility].  Dillard Cannon MD Main # [phone] Pager  [phone] Mobile [phone]

## 2020-06-29 ENCOUNTER — Other Ambulatory Visit: Payer: Self-pay

## 2020-06-29 ENCOUNTER — Inpatient Hospital Stay: Payer: 59 | Attending: Oncology

## 2020-06-29 VITALS — BP 138/65 | HR 74 | Temp 97.7°F | Resp 18 | Wt 235.1 lb

## 2020-06-29 DIAGNOSIS — C7B02 Secondary carcinoid tumors of liver: Secondary | ICD-10-CM | POA: Insufficient documentation

## 2020-06-29 DIAGNOSIS — Z79899 Other long term (current) drug therapy: Secondary | ICD-10-CM | POA: Diagnosis not present

## 2020-06-29 DIAGNOSIS — E340 Carcinoid syndrome: Secondary | ICD-10-CM | POA: Diagnosis present

## 2020-06-29 DIAGNOSIS — C7B8 Other secondary neuroendocrine tumors: Secondary | ICD-10-CM

## 2020-06-29 DIAGNOSIS — C7A00 Malignant carcinoid tumor of unspecified site: Secondary | ICD-10-CM

## 2020-06-29 MED ORDER — OCTREOTIDE ACETATE 30 MG IM KIT
PACK | INTRAMUSCULAR | Status: AC
  Filled 2020-06-29: qty 1

## 2020-06-29 MED ORDER — OCTREOTIDE ACETATE 30 MG IM KIT
30.0000 mg | PACK | Freq: Once | INTRAMUSCULAR | Status: AC
  Administered 2020-06-29: 30 mg via INTRAMUSCULAR

## 2020-06-29 NOTE — Patient Instructions (Signed)
[facility] Discharge Instructions for Patients Receiving Chemotherapy  Today you received the following chemotherapy agents Sandostatin  To help prevent nausea and vomiting after your treatment, we encourage you to take your nausea medication as directed   If you develop nausea and vomiting that is not controlled by your nausea medication, call the clinic.   BELOW ARE SYMPTOMS THAT SHOULD BE REPORTED IMMEDIATELY:  *FEVER GREATER THAN 100.5 F  *CHILLS WITH OR WITHOUT FEVER  NAUSEA AND VOMITING THAT IS NOT CONTROLLED WITH YOUR NAUSEA MEDICATION  *UNUSUAL SHORTNESS OF BREATH  *UNUSUAL BRUISING OR BLEEDING  TENDERNESS IN MOUTH AND THROAT WITH OR WITHOUT PRESENCE OF ULCERS  *URINARY PROBLEMS  *BOWEL PROBLEMS  UNUSUAL RASH Items with * indicate a potential emergency and should be followed up as soon as possible.  Feel free to call the clinic should you have any questions or concerns at The clinic phone number is [phone].  Please show the [facility] at check-in to the Emergency Department and triage nurse.  Octreotide injection solution What is this medicine? OCTREOTIDE (ok TREE oh tide) is used to reduce blood levels of growth hormone in patients with a condition called acromegaly. This medicine also reduces flushing and watery diarrhea caused by certain types of cancer. This medicine may be used for other purposes; ask your health care provider or pharmacist if you have questions. COMMON BRAND NAME(S): Leatha Gilding, Sandostatin What should I tell my health care provider before I take this medicine? They need to know if you have any of these conditions:  diabetes  gallbladder disease  kidney disease  liver disease  thyroid disease  an unusual or allergic reaction to octreotide, other medicines, foods, dyes, or preservatives  pregnant or trying to get pregnant  breast-feeding How should I use this medicine? This medicine is for  injection under the skin or into a vein (only in emergency situations). It is usually given by a health care professional in a hospital or clinic setting. If you get this medicine at home, you will be taught how to prepare and give this medicine. Allow the injection solution to come to room temperature before use. Do not warm it artificially. Use exactly as directed. Take your medicine at regular intervals. Do not take your medicine more often than directed. It is important that you put your used needles and syringes in a special sharps container. Do not put them in a trash can. If you do not have a sharps container, call your pharmacist or healthcare provider to get one. Talk to your pediatrician regarding the use of this medicine in children. Special care may be needed. Overdosage: If you think you have taken too much of this medicine contact a poison control center or emergency room at once. NOTE: This medicine is only for you. Do not share this medicine with others. What if I miss a dose? If you miss a dose, take it as soon as you can. If it is almost time for your next dose, take only that dose. Do not take double or extra doses. What may interact with this medicine?  bromocriptine  certain medicines for blood pressure, heart disease, irregular heartbeat  cyclosporine  diuretics  medicines for diabetes, including insulin  quinidine This list may not describe all possible interactions. Give your health care provider a list of all the medicines, herbs, non-prescription drugs, or dietary supplements you use. Also tell them if you smoke, drink alcohol, or use illegal drugs. Some  items may interact with your medicine. What should I watch for while using this medicine? Visit your doctor or health care professional for regular checks on your progress. To help reduce irritation at the injection site, use a different site for each injection and make sure the solution is at room temperature before  use. This medicine may cause decreases in blood sugar. Signs of low blood sugar include chills, cool, pale skin or cold sweats, drowsiness, extreme hunger, fast heartbeat, headache, nausea, nervousness or anxiety, shakiness, trembling, unsteadiness, tiredness, or weakness. Contact your doctor or health care professional right away if you experience any of these symptoms. This medicine may increase blood sugar. Ask your healthcare provider if changes in diet or medicines are needed if you have diabetes. This medicine may cause a decrease in vitamin B12. You should make sure that you get enough vitamin B12 while you are taking this medicine. Discuss the foods you eat and the vitamins you take with your health care professional. What side effects may I notice from receiving this medicine? Side effects that you should report to your doctor or health care professional as soon as possible:  allergic reactions like skin rash, itching or hives, swelling of the face, lips, or tongue  fast, slow, or irregular heartbeat  right upper belly pain  severe stomach pain  signs and symptoms of high blood sugar such as being more thirsty or hungry or having to urinate more than normal. You may also feel very tired or have blurry vision.  signs and symptoms of low blood sugar such as feeling anxious; confusion; dizziness; increased hunger; unusually weak or tired; increased sweating; shakiness; cold, clammy skin; irritable; headache; blurred vision; fast heartbeat; loss of consciousness  unusually weak or tired Side effects that usually do not require medical attention (report to your doctor or health care professional if they continue or are bothersome):  diarrhea  dizziness  gas  headache  nausea, vomiting  pain, redness, or irritation at site where injected  upset stomach This list may not describe all possible side effects. Call your doctor for medical advice about side effects. You may report  side effects to FDA at 1-800-FDA-1088. Where should I keep my medicine? Keep out of the reach of children. Store in a refrigerator between 2 and 8 degrees C (36 and 46 degrees F). Protect from light. Allow to come to room temperature naturally. Do not use artificial heat. If protected from light, the injection may be stored at room temperature between 20 and 30 degrees C (70 and 86 degrees F) for 14 days. After the initial use, throw away any unused portion of a multiple dose vial after 14 days. Throw away unused portions of the ampules after use. NOTE: This sheet is a summary. It may not cover all possible information. If you have questions about this medicine, talk to your doctor, pharmacist, or health care provider.  2021 Elsevier/Gold Standard (2018-11-12 13:33:09)

## 2020-07-24 NOTE — Progress Notes (Signed)
[facility]  [address] [place],  [place]  [address] [phone]  Clinic Day:  07/26/2020  Referring physician: Jeanie Sewer, NP   This document serves as a record of services personally performed by Hosie Poisson, MD. It was created on their behalf by Curry,Lauren E, a trained medical scribe. The creation of this record is based on the scribe's personal observations and the provider's statements to them.  CHIEF COMPLAINT:  CC: Metastatic carcinoid  Current Treatment:  Octreotide injections  HISTORY OF PRESENT ILLNESS:  Glenn Bender is a 60 y.o. male with metastatic carcinoid tumor diagnosed in September 2021.  The patient started to experience hematuria, and was felt to have a kidney stone, and so CT imaging was pursued.  CT imaging on September 8th which showed multiple liver masses, the largest measuring 4.0 x 4.3 cm, a terminal ileum mass measuring 4.0 x 2.8 x 3.7 cm, and a mass of the mesentery of the small intestine measuring 3.9 x 2.3 x 4.0 cm.  He was therefore scheduled for diagnostic colonoscopy with Dr. Coralie Keens on September 22nd which revealed a large mucosal covered mass protruding through the ileocecal valve with peristalsis.  Biopsy was collected and surgical pathology was benign.  Liver biopsy from September 28th was pursued and surgical pathology from this procedure confirmed metastatic neuroendocrine tumor, grade 2, consistent with gastrointestinal primary.  Synaptophysin and chromogranin-A immunostains were positive as well as CDX-2 supporting gastrointestinal origin.  Ki67 was 6%.  He reports flushing of his face with alcohol and certain foods.  He reported diarrhea, as well as flushing.  24 hour urine for 5HIAA was also elevated at 86.8, which is consistent with carcinoid.  Chromogranin A was elevated at 214 in October and came down to 116.9 in December.  CT chest from November 2021 revealed left paratracheal adenopathy and pleural  nodularity along the right hemidiaphragm measuring up to 12 mm, worrisome for metastatic disease.    CT chest, abdomen and pelvis from February 2022 revealed mostly stable to slightly decreased appearance of the carcinoid tumor, including the mesenteric lesion with cicatricial reaction, adjacent wall.  However there is, increased nodularity in a bandlike fashion along the mesenteric vessels adjacent to the carcinoid tumor.  There is also stable nodularity/lobularity along the right hemidiaphragm.   INTERVAL HISTORY:  [name] is here for routine follow up prior to his next octreotide.  He states that he has been well and denies complaints today.  His hemoglobin is fairly stable at 13.6, and white count and platelets are normal. Chemistries are unremarkable except for a stable creatinine of 1.4 and a calcium of 8.3.  Chromogranin A from February was 111.4, previously 116.9 in December.  His  appetite is good, and his weight is stable since his last visit.  He denies fever, chills or other signs of infection.  He denies nausea, vomiting, bowel issues, or abdominal pain.  He denies sore throat, cough, dyspnea, or chest pain.  REVIEW OF SYSTEMS:  Review of Systems  Constitutional: Negative.  Negative for appetite change, chills, fatigue, fever and unexpected weight change.  HENT:  Negative.   Eyes: Negative.   Respiratory: Negative.  Negative for chest tightness, cough, hemoptysis, shortness of breath and wheezing.   Cardiovascular: Negative.  Negative for chest pain, leg swelling and palpitations.  Gastrointestinal: Negative.  Negative for abdominal distention, abdominal pain, blood in stool, constipation, diarrhea, nausea and vomiting.  Endocrine: Negative.   Genitourinary: Negative.  Negative for difficulty urinating, dysuria,  frequency and hematuria.   Musculoskeletal: Negative.  Negative for arthralgias, back pain, flank pain, gait problem and myalgias.  Skin: Negative.   Neurological: Negative.   Negative for dizziness, extremity weakness, gait problem, headaches, light-headedness, numbness, seizures and speech difficulty.  Hematological: Negative.   Psychiatric/Behavioral: Negative.  Negative for depression and sleep disturbance. The patient is not nervous/anxious.   All other systems reviewed and are negative.  VITALS:  Blood pressure 140/85, pulse 70, temperature 98.2 F (36.8 C), resp. rate 20, height $RemoveBe'5\' 10"'JZflsJzsc$  (1.778 m), weight 239 lb 14.4 oz (108.8 kg), SpO2 97 %.  Wt Readings from Last 3 Encounters:  07/26/20 239 lb 14.4 oz (108.8 kg)  06/29/20 235 lb 1.9 oz (106.6 kg)  06/13/20 239 lb 13.8 oz (108.8 kg)    Body mass index is 34.42 kg/m.  Performance status (ECOG): 0 - Asymptomatic  PHYSICAL EXAM:  Physical Exam Constitutional:      General: He is not in acute distress.    Appearance: Normal appearance. He is normal weight.  HENT:     Head: Normocephalic and atraumatic.  Eyes:     General: No scleral icterus.    Extraocular Movements: Extraocular movements intact.     Conjunctiva/sclera: Conjunctivae normal.     Pupils: Pupils are equal, round, and reactive to light.  Cardiovascular:     Rate and Rhythm: Normal rate and regular rhythm.     Pulses: Normal pulses.     Heart sounds: Normal heart sounds. No murmur heard. No friction rub. No gallop.   Pulmonary:     Effort: Pulmonary effort is normal. No respiratory distress.     Breath sounds: Normal breath sounds.  Abdominal:     General: Bowel sounds are normal. There is no distension.     Palpations: Abdomen is soft. There is hepatomegaly (just at the right costal margin). There is no splenomegaly or mass.     Tenderness: There is no abdominal tenderness.  Musculoskeletal:        General: Normal range of motion.     Cervical back: Normal range of motion and neck supple.     Right lower leg: No edema.     Left lower leg: No edema.  Lymphadenopathy:     Cervical: No cervical adenopathy.  Skin:    General:  Skin is warm and dry.  Neurological:     General: No focal deficit present.     Mental Status: He is alert and oriented to person, place, and time. Mental status is at baseline.  Psychiatric:        Mood and Affect: Mood normal.        Behavior: Behavior normal.        Thought Content: Thought content normal.        Judgment: Judgment normal.    LABS:   CBC Latest Ref Rng & Units 07/26/2020 06/15/2020 06/14/2020  WBC - 6.4 9.0 12.6(H)  Hemoglobin 13.5 - 17.5 13.6 12.0(L) 12.4(L)  Hematocrit 41 - 53 40(A) 38.4(L) 40.3  Platelets 150 - 399 163 128(L) 158   CMP Latest Ref Rng & Units 07/26/2020 06/15/2020 06/14/2020  Glucose 70 - 99 mg/dL - 122(H) 148(H)  BUN 4 - $R'21 16 17 15  'pW$ Creatinine 0.6 - 1.3 1.4(A) 1.54(H) 1.45(H)  Sodium 137 - 147 140 139 140  Potassium 3.4 - 5.3 3.8 3.8 4.3  Chloride 99 - 108 112(A) 106 106  CO2 13 - $Re'22 18 28 24  'GbG$ Calcium 8.7 - 10.7 8.3(A) 8.2(L)  8.1(L)  Total Protein 6.5 - 8.1 g/dL - - -  Total Bilirubin 0.3 - 1.2 mg/dL - - -  Alkaline Phos 25 - 125 60 - -  AST 14 - 40 42(A) - -  ALT 10 - 40 44(A) - -   Chromogranin A is pending.  STUDIES:   Allergies:  No Known Allergies  Current Medications: Current Outpatient Medications  Medication Sig Dispense Refill  . aspirin 81 MG EC tablet Take 1 tablet by mouth daily.    . Ascorbic Acid (VITAMIN C WITH ROSE HIPS) 500 MG tablet Take 500 mg by mouth 3 (three) times a week.    . Biotin 5000 MCG TABS Take 5,000 mcg by mouth 3 (three) times a week.    . Cholecalciferol (VITAMIN D-3) 125 MCG (5000 UT) TABS Take 5,000 Units by mouth 3 (three) times a week.    Marland Kitchen octreotide (SANDOSTATIN LAR) 20 MG injection Inject 20 mg into the muscle every 28 (twenty-eight) days.    Marland Kitchen OVER THE COUNTER MEDICATION Take 1 tablet by mouth 3 (three) times a week. Beet root    . oxyCODONE (OXY IR/ROXICODONE) 5 MG immediate release tablet Take 1 tablet (5 mg total) by mouth every 4 (four) hours as needed for moderate pain (post kidney  stone surgery). 20 tablet 0  . zinc sulfate 220 (50 Zn) MG capsule Take 50 mg by mouth daily.     No current facility-administered medications for this visit.     ASSESSMENT & PLAN:   Assessment:   1. 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  He does have a terminal ileum mass measuring 4.0 x 2.8 x 3.7 cm, and a mass of the mesentery of the small intestine measuring 3.9 x 2.3 x 4.0 cm.  As this has already metastasized, surgical resection is not indicated.  24 hour urine for 5 HIAA was quite elevated at 86.8, confirming carcinoid syndrome.  Chromogranin A was also elevated at 214. He started treatment with octreotide injections on October 25th and continues to tolerate this without difficulty.  Due to the persistent diarrhea, we increased the octreotide to 30 mg every 4 weeks.  2. Multiple liver masses, the largest measuring 4.0 x 4.3 cm, consistent with metastatic carcinoid with carcinoid syndrome.  3. History of testicular cancer diagnosed in 1996, treated with orchiectomy and chemotherapy.   4. Nonobstructive calculi in the left renal collecting system measuring up to 2.0 x 0.6 x 1.0 cm, in the left renal pelvis. Treated with lithotripsy.   5. Diarrhea, secondary to carcinoid malignancy.  This has improved with increasing the octreotide to 30 mg.  6. Mild renal insufficiency, which is stable and may be related to the nephrolithiasis.   Plan: He will proceed with his 6th dose of octreotide 30 mg on March 31st, and continue these every 4 weeks, but we will plan to see him every 8 weeks since he is stable.  We will plan to see him back in 8 weeks with CBC and CMP prior to octreotide.   We will repeat scans in August, as that will be 6 months. The patient and his wife understand the plans discussed today and are in agreement with them.  They know to contact our office if he develops signs or symptoms of infection or other issues requiring immediate  clinical assessment.   Derwood Kaplan, MD [facility] [address] [place] [address] Dept:  [phone] Dept Fax: [phone]   I, Rita Ohara, am acting as scribe for Derwood Kaplan, MD  I have reviewed this report as typed by the medical scribe, and it is complete and accurate.

## 2020-07-26 ENCOUNTER — Telehealth: Payer: Self-pay | Admitting: Oncology

## 2020-07-26 ENCOUNTER — Other Ambulatory Visit: Payer: Self-pay

## 2020-07-26 ENCOUNTER — Inpatient Hospital Stay: Payer: 59

## 2020-07-26 ENCOUNTER — Encounter: Payer: Self-pay | Admitting: Oncology

## 2020-07-26 ENCOUNTER — Other Ambulatory Visit: Payer: Self-pay | Admitting: Hematology and Oncology

## 2020-07-26 ENCOUNTER — Inpatient Hospital Stay (INDEPENDENT_AMBULATORY_CARE_PROVIDER_SITE_OTHER): Payer: 59 | Admitting: Oncology

## 2020-07-26 VITALS — BP 140/85 | HR 70 | Temp 98.2°F | Resp 20 | Ht 70.0 in | Wt 239.9 lb

## 2020-07-26 DIAGNOSIS — C7B8 Other secondary neuroendocrine tumors: Secondary | ICD-10-CM

## 2020-07-26 DIAGNOSIS — C7A00 Malignant carcinoid tumor of unspecified site: Secondary | ICD-10-CM

## 2020-07-26 LAB — COMPREHENSIVE METABOLIC PANEL
Albumin: 4.2 (ref 3.5–5.0)
Calcium: 8.3 — AB (ref 8.7–10.7)

## 2020-07-26 LAB — HEPATIC FUNCTION PANEL
ALT: 44 — AB (ref 10–40)
AST: 42 — AB (ref 14–40)
Alkaline Phosphatase: 60 (ref 25–125)
Bilirubin, Total: 0.5

## 2020-07-26 LAB — CBC AND DIFFERENTIAL
HCT: 40 — AB (ref 41–53)
Hemoglobin: 13.6 (ref 13.5–17.5)
Neutrophils Absolute: 4.74
Platelets: 163 (ref 150–399)
WBC: 6.4

## 2020-07-26 LAB — BASIC METABOLIC PANEL
BUN: 16 (ref 4–21)
CO2: 18 (ref 13–22)
Chloride: 112 — AB (ref 99–108)
Creatinine: 1.4 — AB (ref 0.6–1.3)
Glucose: 156
Potassium: 3.8 (ref 3.4–5.3)
Sodium: 140 (ref 137–147)

## 2020-07-26 LAB — CBC
MCV: 86 (ref 80–94)
RBC: 4.7 (ref 3.87–5.11)

## 2020-07-26 NOTE — Telephone Encounter (Signed)
Per 3/30 LOS, patient scheduled for April, May Appt's.  Gave patient Appt Summary

## 2020-07-27 ENCOUNTER — Inpatient Hospital Stay: Payer: 59

## 2020-07-27 ENCOUNTER — Other Ambulatory Visit: Payer: Self-pay | Admitting: Oncology

## 2020-07-27 VITALS — BP 143/82 | HR 66 | Temp 98.2°F | Resp 20 | Ht 70.0 in | Wt 238.0 lb

## 2020-07-27 DIAGNOSIS — E340 Carcinoid syndrome: Secondary | ICD-10-CM | POA: Diagnosis not present

## 2020-07-27 DIAGNOSIS — C7A00 Malignant carcinoid tumor of unspecified site: Secondary | ICD-10-CM

## 2020-07-27 DIAGNOSIS — C7B8 Other secondary neuroendocrine tumors: Secondary | ICD-10-CM

## 2020-07-27 MED ORDER — OCTREOTIDE ACETATE 30 MG IM KIT
PACK | INTRAMUSCULAR | Status: AC
  Filled 2020-07-27: qty 1

## 2020-07-27 MED ORDER — OCTREOTIDE ACETATE 30 MG IM KIT
30.0000 mg | PACK | Freq: Once | INTRAMUSCULAR | Status: AC
  Administered 2020-07-27: 30 mg via INTRAMUSCULAR

## 2020-07-27 NOTE — Progress Notes (Signed)
1459:PT STABLE AT TIME OF DISCHARGE ?

## 2020-07-27 NOTE — Patient Instructions (Signed)
Octreotide injection solution What is this medicine? OCTREOTIDE (ok TREE oh tide) is used to reduce blood levels of growth hormone in patients with a condition called acromegaly. This medicine also reduces flushing and watery diarrhea caused by certain types of cancer. This medicine may be used for other purposes; ask your health care provider or pharmacist if you have questions. COMMON BRAND NAME(S): Bynfezia, Sandostatin What should I tell my health care provider before I take this medicine? They need to know if you have any of these conditions:  diabetes  gallbladder disease  kidney disease  liver disease  thyroid disease  an unusual or allergic reaction to octreotide, other medicines, foods, dyes, or preservatives  pregnant or trying to get pregnant  breast-feeding How should I use this medicine? This medicine is for injection under the skin or into a vein (only in emergency situations). It is usually given by a health care professional in a hospital or clinic setting. If you get this medicine at home, you will be taught how to prepare and give this medicine. Allow the injection solution to come to room temperature before use. Do not warm it artificially. Use exactly as directed. Take your medicine at regular intervals. Do not take your medicine more often than directed. It is important that you put your used needles and syringes in a special sharps container. Do not put them in a trash can. If you do not have a sharps container, call your pharmacist or healthcare provider to get one. Talk to your pediatrician regarding the use of this medicine in children. Special care may be needed. Overdosage: If you think you have taken too much of this medicine contact a poison control center or emergency room at once. NOTE: This medicine is only for you. Do not share this medicine with others. What if I miss a dose? If you miss a dose, take it as soon as you can. If it is almost time for your  next dose, take only that dose. Do not take double or extra doses. What may interact with this medicine?  bromocriptine  certain medicines for blood pressure, heart disease, irregular heartbeat  cyclosporine  diuretics  medicines for diabetes, including insulin  quinidine This list may not describe all possible interactions. Give your health care provider a list of all the medicines, herbs, non-prescription drugs, or dietary supplements you use. Also tell them if you smoke, drink alcohol, or use illegal drugs. Some items may interact with your medicine. What should I watch for while using this medicine? Visit your doctor or health care professional for regular checks on your progress. To help reduce irritation at the injection site, use a different site for each injection and make sure the solution is at room temperature before use. This medicine may cause decreases in blood sugar. Signs of low blood sugar include chills, cool, pale skin or cold sweats, drowsiness, extreme hunger, fast heartbeat, headache, nausea, nervousness or anxiety, shakiness, trembling, unsteadiness, tiredness, or weakness. Contact your doctor or health care professional right away if you experience any of these symptoms. This medicine may increase blood sugar. Ask your healthcare provider if changes in diet or medicines are needed if you have diabetes. This medicine may cause a decrease in vitamin B12. You should make sure that you get enough vitamin B12 while you are taking this medicine. Discuss the foods you eat and the vitamins you take with your health care professional. What side effects may I notice from receiving this medicine? Side   effects that you should report to your doctor or health care professional as soon as possible:  allergic reactions like skin rash, itching or hives, swelling of the face, lips, or tongue  fast, slow, or irregular heartbeat  right upper belly pain  severe stomach pain  signs  and symptoms of high blood sugar such as being more thirsty or hungry or having to urinate more than normal. You may also feel very tired or have blurry vision.  signs and symptoms of low blood sugar such as feeling anxious; confusion; dizziness; increased hunger; unusually weak or tired; increased sweating; shakiness; cold, clammy skin; irritable; headache; blurred vision; fast heartbeat; loss of consciousness  unusually weak or tired Side effects that usually do not require medical attention (report to your doctor or health care professional if they continue or are bothersome):  diarrhea  dizziness  gas  headache  nausea, vomiting  pain, redness, or irritation at site where injected  upset stomach This list may not describe all possible side effects. Call your doctor for medical advice about side effects. You may report side effects to FDA at 1-800-FDA-1088. Where should I keep my medicine? Keep out of the reach of children. Store in a refrigerator between 2 and 8 degrees C (36 and 46 degrees F). Protect from light. Allow to come to room temperature naturally. Do not use artificial heat. If protected from light, the injection may be stored at room temperature between 20 and 30 degrees C (70 and 86 degrees F) for 14 days. After the initial use, throw away any unused portion of a multiple dose vial after 14 days. Throw away unused portions of the ampules after use. NOTE: This sheet is a summary. It may not cover all possible information. If you have questions about this medicine, talk to your doctor, pharmacist, or health care provider.  2021 Elsevier/Gold Standard (2018-11-12 13:33:09)  

## 2020-08-24 ENCOUNTER — Inpatient Hospital Stay: Payer: 59 | Attending: Oncology

## 2020-08-24 ENCOUNTER — Other Ambulatory Visit: Payer: Self-pay

## 2020-08-24 VITALS — BP 139/75 | HR 63 | Temp 98.2°F | Resp 20 | Ht 70.0 in | Wt 244.2 lb

## 2020-08-24 DIAGNOSIS — C7B02 Secondary carcinoid tumors of liver: Secondary | ICD-10-CM | POA: Diagnosis present

## 2020-08-24 DIAGNOSIS — C7A00 Malignant carcinoid tumor of unspecified site: Secondary | ICD-10-CM

## 2020-08-24 DIAGNOSIS — Z79899 Other long term (current) drug therapy: Secondary | ICD-10-CM | POA: Insufficient documentation

## 2020-08-24 DIAGNOSIS — E340 Carcinoid syndrome: Secondary | ICD-10-CM | POA: Diagnosis present

## 2020-08-24 DIAGNOSIS — C7B8 Other secondary neuroendocrine tumors: Secondary | ICD-10-CM

## 2020-08-24 MED ORDER — OCTREOTIDE ACETATE 30 MG IM KIT
30.0000 mg | PACK | Freq: Once | INTRAMUSCULAR | Status: AC
  Administered 2020-08-24: 30 mg via INTRAMUSCULAR

## 2020-08-24 MED ORDER — OCTREOTIDE ACETATE 30 MG IM KIT
PACK | INTRAMUSCULAR | Status: AC
  Filled 2020-08-24: qty 1

## 2020-08-24 NOTE — Patient Instructions (Signed)
Octreotide injection solution What is this medicine? OCTREOTIDE (ok TREE oh tide) is used to reduce blood levels of growth hormone in patients with a condition called acromegaly. This medicine also reduces flushing and watery diarrhea caused by certain types of cancer. This medicine may be used for other purposes; ask your health care provider or pharmacist if you have questions. COMMON BRAND NAME(S): Bynfezia, Sandostatin What should I tell my health care provider before I take this medicine? They need to know if you have any of these conditions:  diabetes  gallbladder disease  kidney disease  liver disease  thyroid disease  an unusual or allergic reaction to octreotide, other medicines, foods, dyes, or preservatives  pregnant or trying to get pregnant  breast-feeding How should I use this medicine? This medicine is for injection under the skin or into a vein (only in emergency situations). It is usually given by a health care professional in a hospital or clinic setting. If you get this medicine at home, you will be taught how to prepare and give this medicine. Allow the injection solution to come to room temperature before use. Do not warm it artificially. Use exactly as directed. Take your medicine at regular intervals. Do not take your medicine more often than directed. It is important that you put your used needles and syringes in a special sharps container. Do not put them in a trash can. If you do not have a sharps container, call your pharmacist or healthcare provider to get one. Talk to your pediatrician regarding the use of this medicine in children. Special care may be needed. Overdosage: If you think you have taken too much of this medicine contact a poison control center or emergency room at once. NOTE: This medicine is only for you. Do not share this medicine with others. What if I miss a dose? If you miss a dose, take it as soon as you can. If it is almost time for your  next dose, take only that dose. Do not take double or extra doses. What may interact with this medicine?  bromocriptine  certain medicines for blood pressure, heart disease, irregular heartbeat  cyclosporine  diuretics  medicines for diabetes, including insulin  quinidine This list may not describe all possible interactions. Give your health care provider a list of all the medicines, herbs, non-prescription drugs, or dietary supplements you use. Also tell them if you smoke, drink alcohol, or use illegal drugs. Some items may interact with your medicine. What should I watch for while using this medicine? Visit your doctor or health care professional for regular checks on your progress. To help reduce irritation at the injection site, use a different site for each injection and make sure the solution is at room temperature before use. This medicine may cause decreases in blood sugar. Signs of low blood sugar include chills, cool, pale skin or cold sweats, drowsiness, extreme hunger, fast heartbeat, headache, nausea, nervousness or anxiety, shakiness, trembling, unsteadiness, tiredness, or weakness. Contact your doctor or health care professional right away if you experience any of these symptoms. This medicine may increase blood sugar. Ask your healthcare provider if changes in diet or medicines are needed if you have diabetes. This medicine may cause a decrease in vitamin B12. You should make sure that you get enough vitamin B12 while you are taking this medicine. Discuss the foods you eat and the vitamins you take with your health care professional. What side effects may I notice from receiving this medicine? Side   effects that you should report to your doctor or health care professional as soon as possible:  allergic reactions like skin rash, itching or hives, swelling of the face, lips, or tongue  fast, slow, or irregular heartbeat  right upper belly pain  severe stomach pain  signs  and symptoms of high blood sugar such as being more thirsty or hungry or having to urinate more than normal. You may also feel very tired or have blurry vision.  signs and symptoms of low blood sugar such as feeling anxious; confusion; dizziness; increased hunger; unusually weak or tired; increased sweating; shakiness; cold, clammy skin; irritable; headache; blurred vision; fast heartbeat; loss of consciousness  unusually weak or tired Side effects that usually do not require medical attention (report to your doctor or health care professional if they continue or are bothersome):  diarrhea  dizziness  gas  headache  nausea, vomiting  pain, redness, or irritation at site where injected  upset stomach This list may not describe all possible side effects. Call your doctor for medical advice about side effects. You may report side effects to FDA at 1-800-FDA-1088. Where should I keep my medicine? Keep out of the reach of children. Store in a refrigerator between 2 and 8 degrees C (36 and 46 degrees F). Protect from light. Allow to come to room temperature naturally. Do not use artificial heat. If protected from light, the injection may be stored at room temperature between 20 and 30 degrees C (70 and 86 degrees F) for 14 days. After the initial use, throw away any unused portion of a multiple dose vial after 14 days. Throw away unused portions of the ampules after use. NOTE: This sheet is a summary. It may not cover all possible information. If you have questions about this medicine, talk to your doctor, pharmacist, or health care provider.  2021 Elsevier/Gold Standard (2018-11-12 13:33:09)  

## 2020-08-24 NOTE — Progress Notes (Signed)
1557: PT STABLE AT TIME OF DISCHARGE

## 2020-09-20 ENCOUNTER — Encounter: Payer: Self-pay | Admitting: Hematology and Oncology

## 2020-09-20 ENCOUNTER — Other Ambulatory Visit: Payer: Self-pay

## 2020-09-20 ENCOUNTER — Inpatient Hospital Stay: Payer: 59 | Attending: Oncology

## 2020-09-20 ENCOUNTER — Other Ambulatory Visit: Payer: Self-pay | Admitting: Hematology and Oncology

## 2020-09-20 ENCOUNTER — Inpatient Hospital Stay (INDEPENDENT_AMBULATORY_CARE_PROVIDER_SITE_OTHER): Payer: 59 | Admitting: Hematology and Oncology

## 2020-09-20 ENCOUNTER — Telehealth: Payer: Self-pay | Admitting: Hematology and Oncology

## 2020-09-20 VITALS — BP 149/87 | HR 54 | Temp 98.2°F | Resp 16 | Ht 70.0 in | Wt 248.3 lb

## 2020-09-20 DIAGNOSIS — C7B09 Secondary carcinoid tumors of other sites: Secondary | ICD-10-CM

## 2020-09-20 DIAGNOSIS — C7B8 Other secondary neuroendocrine tumors: Secondary | ICD-10-CM | POA: Diagnosis not present

## 2020-09-20 DIAGNOSIS — C7A00 Malignant carcinoid tumor of unspecified site: Secondary | ICD-10-CM

## 2020-09-20 DIAGNOSIS — E340 Carcinoid syndrome: Secondary | ICD-10-CM | POA: Diagnosis present

## 2020-09-20 DIAGNOSIS — Z79899 Other long term (current) drug therapy: Secondary | ICD-10-CM | POA: Diagnosis not present

## 2020-09-20 DIAGNOSIS — C7B02 Secondary carcinoid tumors of liver: Secondary | ICD-10-CM | POA: Diagnosis present

## 2020-09-20 LAB — HEPATIC FUNCTION PANEL
ALT: 42 — AB (ref 10–40)
AST: 29 (ref 14–40)
Alkaline Phosphatase: 55 (ref 25–125)
Bilirubin, Total: 0.6

## 2020-09-20 LAB — CBC AND DIFFERENTIAL
HCT: 40 — AB (ref 41–53)
Hemoglobin: 13.9 (ref 13.5–17.5)
Neutrophils Absolute: 4.62
Platelets: 136 — AB (ref 150–399)
WBC: 6.6

## 2020-09-20 LAB — COMPREHENSIVE METABOLIC PANEL
Albumin: 4.1 (ref 3.5–5.0)
Calcium: 8.3 — AB (ref 8.7–10.7)

## 2020-09-20 LAB — CBC
MCV: 85 (ref 80–94)
RBC: 4.72 (ref 3.87–5.11)

## 2020-09-20 LAB — BASIC METABOLIC PANEL
BUN: 17 (ref 4–21)
CO2: 22 (ref 13–22)
Chloride: 109 — AB (ref 99–108)
Creatinine: 1.4 — AB (ref 0.6–1.3)
Glucose: 109
Potassium: 3.8 (ref 3.4–5.3)
Sodium: 139 (ref 137–147)

## 2020-09-20 NOTE — Progress Notes (Signed)
[facility]  [address] [place],  [place]  [address] [phone]  Clinic Day:  09/20/2020  Referring physician: Jeanie Sewer, NP   CHIEF COMPLAINT:  CC:  Metastatic carcinoid to liver  Current Treatment:   Octreotide 30 mg IM monthly   HISTORY OF PRESENT ILLNESS:  Glenn Bender is a 60 y.o. male with metastatic carcinoid to liver diagnosed in September 2021.  The patient presented in September with hematuria, felt to be due to a kidney stone.  CT abdomen and pelvis revealed multiple liver masses, the largest measuring 4.0 x 4.3 cm, a terminal ileum mass measuring 4.0 x 2.8 x 3.7 cm, and a mass of the mesentery of the small intestine measuring 3.9 x 2.3 x 4.0 cm.  Diagnostic colonoscopy with Dr. Coralie Keens in September revealed a large mucosal covered mass protruding through the ileocecal valve with peristalsis.  Biopsy was collected and surgical pathology was benign.  Liver biopsy revealed metastatic neuroendocrine tumor, grade 2, consistent with gastrointestinal primary. Synaptophysin and chromogranin-A immunostains were positive as well as CDX-2 supporting gastrointestinal origin.  Ki67 was 6%.  He reports flushing of his face with alcohol and certain foods.  He reported diarrhea, as well as flushing.  24 hour urine for 5HIAA was also elevated at 86.8, which is consistent with carcinoid.  Chromogranin A was elevated at 214 in October and came down to 116.9 in December.  CT chest from November 2021 revealed left paratracheal adenopathy and pleural nodularity along the right hemidiaphragm measuring up to 12 mm, worrisome for metastatic disease. As he had metastatic disease, resection of his primary was not recommended. The patient was started on octreotide 20 mg IM monthly in October.  Octreotide was subsequently increased to 30 mg monthly in December due to persistent diarrhea.  CT chest, abdomen and pelvis from February 2022 revealed mostly stable to  slightly decreased appearance of the carcinoid tumor, including the mesenteric lesion with cicatricial reaction, adjacent wall.  There was increased nodularity in a bandlike fashion along the mesenteric vessels adjacent to the carcinoid tumor.  There was stable nodularity/lobularity along the right hemidiaphragm. Chromogranin A in February had decreased to 111.4.Chromogranin A in March was 100.3, which is within normal limits.  He had improvement in his diarrhea with the increase in octreotide.  INTERVAL HISTORY:  [name] is here today for repeat clinical assessment.  He reports occasional flushing and diarrhea, but better controlled than prior to taking octreotide. He denies fevers or chills. He denies pain. His appetite is good. His weight has increased 4 pounds over last 4 weeks.  REVIEW OF SYSTEMS:  Review of Systems  Constitutional: Negative for appetite change, chills, fatigue, fever and unexpected weight change.  HENT:   Negative for lump/mass, mouth sores and sore throat.   Respiratory: Negative for cough and shortness of breath.   Cardiovascular: Negative for chest pain and leg swelling.  Gastrointestinal: Negative for abdominal pain, constipation, diarrhea, nausea and vomiting.  Genitourinary: Negative for difficulty urinating, dysuria, frequency and hematuria.   Musculoskeletal: Negative for arthralgias, back pain and myalgias.  Skin: Negative for itching, rash and wound.  Neurological: Negative for dizziness, extremity weakness, headaches, light-headedness and numbness.  Hematological: Negative for adenopathy.  Psychiatric/Behavioral: Negative for depression and sleep disturbance. The patient is not nervous/anxious.    VITALS:  Blood pressure (!) 149/87, pulse (!) 54, temperature 98.2 F (36.8 C), resp. rate 16, height _0  (1.778 m), weight 248 lb 4.8 oz (112.6 kg), SpO2 97 %.  Wt Readings from Last 3 Encounters:  09/20/20 248 lb 4.8 oz (112.6 kg)  08/24/20 244 lb 4 oz (110.8 kg)   07/27/20 238 lb (108 kg)    Body mass index is 35.63 kg/m.  Performance status (ECOG): 0 - Asymptomatic  PHYSICAL EXAM:  Physical Exam Vitals and nursing note reviewed.  Constitutional:      General: He is not in acute distress.    Appearance: Normal appearance. He is normal weight.  HENT:     Head: Normocephalic and atraumatic.     Mouth/Throat:     Mouth: Mucous membranes are moist.     Pharynx: Oropharynx is clear. No oropharyngeal exudate or posterior oropharyngeal erythema.  Eyes:     General: No scleral icterus.    Extraocular Movements: Extraocular movements intact.     Conjunctiva/sclera: Conjunctivae normal.     Pupils: Pupils are equal, round, and reactive to light.  Cardiovascular:     Rate and Rhythm: Normal rate and regular rhythm.     Heart sounds: Normal heart sounds. No murmur heard. No friction rub. No gallop.   Pulmonary:     Effort: Pulmonary effort is normal.     Breath sounds: Normal breath sounds. No wheezing, rhonchi or rales.  Chest:  Breasts:     Right: No axillary adenopathy or supraclavicular adenopathy.     Left: No axillary adenopathy or supraclavicular adenopathy.    Abdominal:     General: Bowel sounds are normal. There is no distension.     Palpations: Abdomen is soft. There is no hepatomegaly, splenomegaly or mass.     Tenderness: There is no abdominal tenderness.  Musculoskeletal:        General: Normal range of motion.     Cervical back: Normal range of motion and neck supple. No tenderness.     Right lower leg: No edema.     Left lower leg: No edema.  Lymphadenopathy:     Cervical: No cervical adenopathy.     Upper Body:     Right upper body: No supraclavicular or axillary adenopathy.     Left upper body: No supraclavicular or axillary adenopathy.     Lower Body: No right inguinal adenopathy. No left inguinal adenopathy.  Skin:    General: Skin is warm and dry.     Coloration: Skin is not jaundiced.     Findings: No rash.   Neurological:     Mental Status: He is alert and oriented to person, place, and time.     Cranial Nerves: No cranial nerve deficit.  Psychiatric:        Mood and Affect: Mood normal.        Behavior: Behavior normal.        Thought Content: Thought content normal.    LABS:   CBC Latest Ref Rng & Units 09/20/2020 07/26/2020 06/15/2020  WBC - 6.6 6.4 9.0  Hemoglobin 13.5 - 17.5 13.9 13.6 12.0(L)  Hematocrit 41 - 53 40(A) 40(A) 38.4(L)  Platelets 150 - 399 136(A) 163 128(L)   CMP Latest Ref Rng & Units 09/20/2020 07/26/2020 06/15/2020  Glucose 70 - 99 mg/dL - - 122(H)  BUN 4 - _0 Creatinine 0.6 - 1.3 1.4(A) 1.4(A) 1.54(H)  Sodium 137 - 147 139 140 139  Potassium 3.4 - 5.3 3.8 3.8 3.8  Chloride 99 - 108 109(A) 112(A) 106  CO2 13 - _1 Calcium 8.7 - 10.7 8.3(A) 8.3(A) 8.2(L)  Total Protein 6.5 -  8.1 g/dL - - -  Total Bilirubin 0.3 - 1.2 mg/dL - - -  Alkaline Phos 25 - 125 55 60 -  AST 14 - 40 29 42(A) -  ALT 10 - 40 42(A) 44(A) -     No results found for: CEA1 / No results found for: CEA1 No results found for: PSA1 No results found for: HFW263 No results found for: CAN125  No results found for: TOTALPROTELP, ALBUMINELP, A1GS, A2GS, BETS, BETA2SER, GAMS, MSPIKE, SPEI No results found for: TIBC, FERRITIN, IRONPCTSAT No results found for: LDH  STUDIES:  No results found.    HISTORY:   Past Medical History:  Diagnosis Date  . Arthritis    Rt foot  . History of COVID-19 2020   hospitalized for 1 month  . History of kidney stones   . History of testicular cancer 1996  . Malignant carcinoid tumor of the ileum ([facility])   . 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 Secondary malignant carcinoid tumor of liver [facility])     Past Surgical History:  Procedure Laterality Date  . CYSTOTOMY  01/2020  . IR URETERAL STENT LEFT NEW ACCESS W/O SEP NEPHROSTOMY CATH  06/13/2020  . IR URETERAL STENT PLACEMENT  EXISTING ACCESS LEFT  06/15/2020  . LYMPH NODE BIOPSY Left    supraclavicular  . NEPHROLITHOTOMY Left 06/13/2020   Procedure: NEPHROLITHOTOMY PERCUTANEOUS;  Surgeon: Robley Fries, MD;  Location: [facility];  Service: Urology;  Laterality: Left;  2 HRS  . ORCHIECTOMY  1996  . UMBILICAL HERNIA REPAIR      Family History  Problem Relation Age of Onset  . Cancer Mother        jaw bone  . Throat cancer Father        smoker    Social History:  reports that he quit smoking about 25 years ago. He has a 4.75 pack-year smoking history. He has never used smokeless tobacco. He reports current alcohol use. He reports that he does not use drugs.The patient is alone today.  Allergies: No Known Allergies  Current Medications: Current Outpatient Medications  Medication Sig Dispense Refill  . Ascorbic Acid (VITAMIN C WITH ROSE HIPS) 500 MG tablet Take 500 mg by mouth 3 (three) times a week.    Marland Kitchen aspirin 81 MG EC tablet Take 1 tablet by mouth daily.    . Biotin 5000 MCG TABS Take 5,000 mcg by mouth 3 (three) times a week.    . Cholecalciferol (VITAMIN D-3) 125 MCG (5000 UT) TABS Take 5,000 Units by mouth 3 (three) times a week.    Marland Kitchen octreotide (SANDOSTATIN LAR) 20 MG injection Inject 20 mg into the muscle every 28 (twenty-eight) days.    Marland Kitchen OVER THE COUNTER MEDICATION Take 1 tablet by mouth 3 (three) times a week. Beet root    . oxyCODONE (OXY IR/ROXICODONE) 5 MG immediate release tablet Take 1 tablet (5 mg total) by mouth every 4 (four) hours as needed for moderate pain (post kidney stone surgery). 20 tablet 0  . zinc sulfate 220 (50 Zn) MG capsule Take 50 mg by mouth daily.     No current facility-administered medications for this visit.     ASSESSMENT & PLAN:   Assessment:   1. 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  He does have a terminal ileum mass measuring 4.0 x 2.8 x 3.7 cm, and a mass of the mesentery of the small  intestine measuring 3.9 x  2.3 x 4.0 cm.  As this has already metastasized, surgical resection is not indicated.  24 hour urine for 5 HIAA was quite elevated at 86.8, confirming carcinoid syndrome.  Chromogranin A was also elevated at 214. He started treatment with octreotide injections in October 2021 and continues to tolerate this without difficulty.  Due to the persistent diarrhea, we increased the octreotide to 30 mg every 4 weeks. Dr. Hinton Rao recommended repeat imaging in August.  2. Multiple liver masses, the largest measuring 4.0 x 4.3 cm, consistent with metastatic carcinoid with carcinoid syndrome.  3. History of testicular cancer diagnosed in 1996, treated with orchiectomy and chemotherapy.   4.  Diarrhea, secondary to carcinoid malignancy.  This has improved with increasing the octreotide to 30 mg.  Plan:    He will continue octreotide 30 mg monthly.  We will plan to see him back in 3 months with a CBC, comprehensive metabolic panel, and chromogranin A, as well as repeat CT chest, abdomen and pelvis to reassess his disease baseline. The patient understands the plans discussed today and is in agreement with them.  He knows to contact our office if he develops concerns prior to his next appointment.      Marvia Pickles, PA-C

## 2020-09-20 NOTE — Progress Notes (Unsigned)
.  chr

## 2020-09-20 NOTE — Telephone Encounter (Signed)
Per 5/25 LOS, patient scheduled for June, July Injections - Gave Appt Summary  Will send request for Labs, CT Scan - then schedule Follow Up for August

## 2020-09-21 ENCOUNTER — Inpatient Hospital Stay: Payer: 59

## 2020-09-21 VITALS — BP 132/74 | HR 62 | Temp 98.2°F | Resp 18 | Ht 70.0 in | Wt 246.0 lb

## 2020-09-21 DIAGNOSIS — C7A00 Malignant carcinoid tumor of unspecified site: Secondary | ICD-10-CM

## 2020-09-21 DIAGNOSIS — E340 Carcinoid syndrome: Secondary | ICD-10-CM | POA: Diagnosis not present

## 2020-09-21 DIAGNOSIS — C7B8 Other secondary neuroendocrine tumors: Secondary | ICD-10-CM

## 2020-09-21 MED ORDER — OCTREOTIDE ACETATE 30 MG IM KIT
30.0000 mg | PACK | Freq: Once | INTRAMUSCULAR | Status: AC
  Administered 2020-09-21: 30 mg via INTRAMUSCULAR

## 2020-09-21 NOTE — Patient Instructions (Signed)
Octreotide injection solution What is this medicine? OCTREOTIDE (ok TREE oh tide) is used to reduce blood levels of growth hormone in patients with a condition called acromegaly. This medicine also reduces flushing and watery diarrhea caused by certain types of cancer. This medicine may be used for other purposes; ask your health care provider or pharmacist if you have questions. COMMON BRAND NAME(S): Bynfezia, Sandostatin What should I tell my health care provider before I take this medicine? They need to know if you have any of these conditions:  diabetes  gallbladder disease  kidney disease  liver disease  thyroid disease  an unusual or allergic reaction to octreotide, other medicines, foods, dyes, or preservatives  pregnant or trying to get pregnant  breast-feeding How should I use this medicine? This medicine is for injection under the skin or into a vein (only in emergency situations). It is usually given by a health care professional in a hospital or clinic setting. If you get this medicine at home, you will be taught how to prepare and give this medicine. Allow the injection solution to come to room temperature before use. Do not warm it artificially. Use exactly as directed. Take your medicine at regular intervals. Do not take your medicine more often than directed. It is important that you put your used needles and syringes in a special sharps container. Do not put them in a trash can. If you do not have a sharps container, call your pharmacist or healthcare provider to get one. Talk to your pediatrician regarding the use of this medicine in children. Special care may be needed. Overdosage: If you think you have taken too much of this medicine contact a poison control center or emergency room at once. NOTE: This medicine is only for you. Do not share this medicine with others. What if I miss a dose? If you miss a dose, take it as soon as you can. If it is almost time for your  next dose, take only that dose. Do not take double or extra doses. What may interact with this medicine?  bromocriptine  certain medicines for blood pressure, heart disease, irregular heartbeat  cyclosporine  diuretics  medicines for diabetes, including insulin  quinidine This list may not describe all possible interactions. Give your health care provider a list of all the medicines, herbs, non-prescription drugs, or dietary supplements you use. Also tell them if you smoke, drink alcohol, or use illegal drugs. Some items may interact with your medicine. What should I watch for while using this medicine? Visit your doctor or health care professional for regular checks on your progress. To help reduce irritation at the injection site, use a different site for each injection and make sure the solution is at room temperature before use. This medicine may cause decreases in blood sugar. Signs of low blood sugar include chills, cool, pale skin or cold sweats, drowsiness, extreme hunger, fast heartbeat, headache, nausea, nervousness or anxiety, shakiness, trembling, unsteadiness, tiredness, or weakness. Contact your doctor or health care professional right away if you experience any of these symptoms. This medicine may increase blood sugar. Ask your healthcare provider if changes in diet or medicines are needed if you have diabetes. This medicine may cause a decrease in vitamin B12. You should make sure that you get enough vitamin B12 while you are taking this medicine. Discuss the foods you eat and the vitamins you take with your health care professional. What side effects may I notice from receiving this medicine? Side   effects that you should report to your doctor or health care professional as soon as possible:  allergic reactions like skin rash, itching or hives, swelling of the face, lips, or tongue  fast, slow, or irregular heartbeat  right upper belly pain  severe stomach pain  signs  and symptoms of high blood sugar such as being more thirsty or hungry or having to urinate more than normal. You may also feel very tired or have blurry vision.  signs and symptoms of low blood sugar such as feeling anxious; confusion; dizziness; increased hunger; unusually weak or tired; increased sweating; shakiness; cold, clammy skin; irritable; headache; blurred vision; fast heartbeat; loss of consciousness  unusually weak or tired Side effects that usually do not require medical attention (report to your doctor or health care professional if they continue or are bothersome):  diarrhea  dizziness  gas  headache  nausea, vomiting  pain, redness, or irritation at site where injected  upset stomach This list may not describe all possible side effects. Call your doctor for medical advice about side effects. You may report side effects to FDA at 1-800-FDA-1088. Where should I keep my medicine? Keep out of the reach of children. Store in a refrigerator between 2 and 8 degrees C (36 and 46 degrees F). Protect from light. Allow to come to room temperature naturally. Do not use artificial heat. If protected from light, the injection may be stored at room temperature between 20 and 30 degrees C (70 and 86 degrees F) for 14 days. After the initial use, throw away any unused portion of a multiple dose vial after 14 days. Throw away unused portions of the ampules after use. NOTE: This sheet is a summary. It may not cover all possible information. If you have questions about this medicine, talk to your doctor, pharmacist, or health care provider.  2021 Elsevier/Gold Standard (2018-11-12 13:33:09)  

## 2020-09-25 LAB — CHROMOGRANIN A: Chromogranin A (ng/mL): 126 ng/mL — ABNORMAL HIGH (ref 0.0–101.8)

## 2020-09-26 ENCOUNTER — Telehealth: Payer: Self-pay

## 2020-09-26 NOTE — Telephone Encounter (Signed)
Patient notified

## 2020-09-26 NOTE — Telephone Encounter (Signed)
-----   Message from Marvia Pickles, PA-C sent at 09/26/2020  8:39 AM EDT ----- Please let him know test for carcinoid slightly higher than in March, but sent to [facility] lab, so the difference is probably due to different lab. Thanks

## 2020-09-26 NOTE — Telephone Encounter (Signed)
Patient was sleeping. Informed lady that answered phone to let patient know to give [place] a call back.

## 2020-09-26 NOTE — Telephone Encounter (Signed)
-----   Message from Marvia Pickles, PA-C sent at 09/26/2020  8:39 AM EDT ----- Please let him know test for carcinoid slightly higher than in March, but sent to [facility] lab, so the difference is probably due to different lab. Thanks

## 2020-10-19 ENCOUNTER — Inpatient Hospital Stay: Payer: 59 | Attending: Oncology

## 2020-10-19 ENCOUNTER — Other Ambulatory Visit: Payer: Self-pay

## 2020-10-19 ENCOUNTER — Telehealth: Payer: Self-pay | Admitting: Hematology and Oncology

## 2020-10-19 VITALS — BP 132/77 | HR 64 | Temp 97.7°F | Resp 16 | Wt 249.0 lb

## 2020-10-19 DIAGNOSIS — R197 Diarrhea, unspecified: Secondary | ICD-10-CM | POA: Insufficient documentation

## 2020-10-19 DIAGNOSIS — C7B8 Other secondary neuroendocrine tumors: Secondary | ICD-10-CM

## 2020-10-19 DIAGNOSIS — C7B02 Secondary carcinoid tumors of liver: Secondary | ICD-10-CM | POA: Diagnosis present

## 2020-10-19 DIAGNOSIS — C7A00 Malignant carcinoid tumor of unspecified site: Secondary | ICD-10-CM | POA: Insufficient documentation

## 2020-10-19 DIAGNOSIS — E340 Carcinoid syndrome: Secondary | ICD-10-CM | POA: Insufficient documentation

## 2020-10-19 DIAGNOSIS — Z5189 Encounter for other specified aftercare: Secondary | ICD-10-CM | POA: Insufficient documentation

## 2020-10-19 MED ORDER — OCTREOTIDE ACETATE 30 MG IM KIT
PACK | INTRAMUSCULAR | Status: AC
  Filled 2020-10-19: qty 1

## 2020-10-19 MED ORDER — OCTREOTIDE ACETATE 30 MG IM KIT
30.0000 mg | PACK | Freq: Once | INTRAMUSCULAR | Status: AC
  Administered 2020-10-19: 30 mg via INTRAMUSCULAR

## 2020-10-19 NOTE — Patient Instructions (Signed)
Octreotide injection solution What is this medication? OCTREOTIDE (ok TREE oh tide) is used to reduce blood levels of growth hormone in patients with a condition called acromegaly. This medicine also reduces flushing and watery diarrhea caused by certain types of cancer. This medicine may be used for other purposes; ask your health care provider or pharmacist if you have questions. COMMON BRAND NAME(S): Bynfezia, Sandostatin What should I tell my care team before I take this medication? They need to know if you have any of these conditions: diabetes gallbladder disease kidney disease liver disease thyroid disease an unusual or allergic reaction to octreotide, other medicines, foods, dyes, or preservatives pregnant or trying to get pregnant breast-feeding How should I use this medication? This medicine is for injection under the skin or into a vein (only in emergency situations). It is usually given by a health care professional in a hospital or clinic setting. If you get this medicine at home, you will be taught how to prepare and give this medicine. Allow the injection solution to come to room temperature before use. Do not warm it artificially. Use exactly as directed. Take your medicine at regular intervals. Do not take your medicine more often than directed. It is important that you put your used needles and syringes in a special sharps container. Do not put them in a trash can. If you do not have a sharps container, call your pharmacist or healthcare provider to get one. Talk to your pediatrician regarding the use of this medicine in children. Special care may be needed. Overdosage: If you think you have taken too much of this medicine contact a poison control center or emergency room at once. NOTE: This medicine is only for you. Do not share this medicine with others. What if I miss a dose? If you miss a dose, take it as soon as you can. If it is almost time for your next dose, take only  that dose. Do not take double or extra doses. What may interact with this medication? bromocriptine certain medicines for blood pressure, heart disease, irregular heartbeat cyclosporine diuretics medicines for diabetes, including insulin quinidine This list may not describe all possible interactions. Give your health care provider a list of all the medicines, herbs, non-prescription drugs, or dietary supplements you use. Also tell them if you smoke, drink alcohol, or use illegal drugs. Some items may interact with your medicine. What should I watch for while using this medication? Visit your doctor or health care professional for regular checks on your progress. To help reduce irritation at the injection site, use a different site for each injection and make sure the solution is at room temperature before use. This medicine may cause decreases in blood sugar. Signs of low blood sugar include chills, cool, pale skin or cold sweats, drowsiness, extreme hunger, fast heartbeat, headache, nausea, nervousness or anxiety, shakiness, trembling, unsteadiness, tiredness, or weakness. Contact your doctor or health care professional right away if you experience any of these symptoms. This medicine may increase blood sugar. Ask your healthcare provider if changes in diet or medicines are needed if you have diabetes. This medicine may cause a decrease in vitamin B12. You should make sure that you get enough vitamin B12 while you are taking this medicine. Discuss the foods you eat and the vitamins you take with your health care professional. What side effects may I notice from receiving this medication? Side effects that you should report to your doctor or health care professional as soon as   possible: allergic reactions like skin rash, itching or hives, swelling of the face, lips, or tongue fast, slow, or irregular heartbeat right upper belly pain severe stomach pain signs and symptoms of high blood sugar such  as being more thirsty or hungry or having to urinate more than normal. You may also feel very tired or have blurry vision. signs and symptoms of low blood sugar such as feeling anxious; confusion; dizziness; increased hunger; unusually weak or tired; increased sweating; shakiness; cold, clammy skin; irritable; headache; blurred vision; fast heartbeat; loss of consciousness unusually weak or tired Side effects that usually do not require medical attention (report to your doctor or health care professional if they continue or are bothersome): diarrhea dizziness gas headache nausea, vomiting pain, redness, or irritation at site where injected upset stomach This list may not describe all possible side effects. Call your doctor for medical advice about side effects. You may report side effects to FDA at 1-800-FDA-1088. Where should I keep my medication? Keep out of the reach of children. Store in a refrigerator between 2 and 8 degrees C (36 and 46 degrees F). Protect from light. Allow to come to room temperature naturally. Do not use artificial heat. If protected from light, the injection may be stored at room temperature between 20 and 30 degrees C (70 and 86 degrees F) for 14 days. After the initial use, throw away any unused portion of a multiple dose vial after 14 days. Throw away unused portions of the ampules after use. NOTE: This sheet is a summary. It may not cover all possible information. If you have questions about this medicine, talk to your doctor, pharmacist, or health care provider.  2022 Elsevier/Gold Standard (2018-11-12 13:33:09)  

## 2020-10-19 NOTE — Telephone Encounter (Signed)
Per 6/23 los next appt scheduled and confirmed by patient

## 2020-10-31 ENCOUNTER — Telehealth: Payer: Self-pay

## 2020-10-31 NOTE — Telephone Encounter (Addendum)
Pt's wife notified of Kelli's,Pam recommendation. She states she will call and get him an appt.  ----- Message from Marvia Pickles, PA-C sent at 10/31/2020 12:44 PM EDT ----- Regarding: RE: Heart speeding up, causing him to breathe faster @ times Contact: [phone] Unlikely due to Sandostatin, which can cause slowing of the heart. If he was "blood red, then had a headache" it could be due to high BP. I recommend he see his PCP asap for further evaluation. Thanks ----- Message ----- From: Dairl Ponder, RN Sent: 10/31/2020  11:53 AM EDT To: Marvia Pickles, PA-C Subject: Heart speeding up, causing him to breathe fa#    I spoke with pt's wife. She reports that pt has told her that he has experienced his "heart speeding up at times, then he has to take more breaths". He didn't want her to call us, but she was concerned. She doesn't know if it was from his treatment? She asked if drinking beer could interfere with it? "He only drinks beer after work, but sometimes I think he drinks too much". No chest pain, No N/V, and no fevers. She mentions that he had episode recently when "they were out to eat fish, his face turned blood red then he had a headache".

## 2020-10-31 NOTE — Telephone Encounter (Signed)
I spoke with pt's wife. She reports that pt has told her that he has experienced his "heart speeding up at times, then he has to take more breaths". He didn't want her to call us, but she was concerned. She doesn't know if it was from his treatment? She asked if drinking beer could interfere with it? "He only drinks beer after work, but sometimes I think he drinks too much". No chest pain, No N/V, and no fevers. She mentions that he had episode recently when "they were out to eat fish, his face turned blood red then he had a headache".

## 2020-11-14 NOTE — Progress Notes (Signed)
Glenn Bender  [address] [place],  [place]  [address] [phone]  Clinic Day:  11/15/2020  Referring physician: Jeanie Sewer, NP   CHIEF COMPLAINT:  CC:  Metastatic carcinoid to liver  Current Treatment:   Octreotide 30 mg IM monthly   HISTORY OF PRESENT ILLNESS:  Glenn Bender is a 60 y.o. male with metastatic carcinoid to liver diagnosed in September 2021.  The patient presented in September with hematuria, felt to be due to a kidney stone.  CT abdomen and pelvis revealed multiple liver masses, the largest measuring 4.0 x 4.3 cm, a terminal ileum mass measuring 4.0 x 2.8 x 3.7 cm, and a mass of the mesentery of the small intestine measuring 3.9 x 2.3 x 4.0 cm.  Diagnostic colonoscopy with Dr. Coralie Keens in September revealed a large mucosal covered mass protruding through the ileocecal valve with peristalsis.  Biopsy was collected and surgical pathology was benign.  Liver biopsy revealed metastatic neuroendocrine tumor, grade 2, consistent with gastrointestinal primary. Synaptophysin and chromogranin-A immunostains were positive as well as CDX-2 supporting gastrointestinal origin.  Ki67 was 6%.  He reports flushing of his face with alcohol and certain foods.  He reported diarrhea, as well as flushing.  24 hour urine for 5HIAA was also elevated at 86.8, which is consistent with carcinoid.  Chromogranin A was elevated at 214 in October and came down to 116.9 in December.  CT chest from November 2021 revealed left paratracheal adenopathy and pleural nodularity along the right hemidiaphragm measuring up to 12 mm, worrisome for metastatic disease. As he had metastatic disease, resection of his primary was not recommended. The patient was started on octreotide 20 mg IM monthly in October.  Octreotide was subsequently increased to 30 mg monthly in December due to persistent diarrhea.   CT chest, abdomen and pelvis from February 2022 revealed mostly stable to  slightly decreased appearance of the carcinoid tumor, including the mesenteric lesion with cicatricial reaction, adjacent wall.  There was increased nodularity in a bandlike fashion along the mesenteric vessels adjacent to the carcinoid tumor. The liver lesions were stable with no new lesions identified. There was stable nodularity/lobularity along the right hemidiaphragm.  He has had a continued decrease in the Chromogranin A, which was 111.4 in February, and then 100.3 in March, which is within normal limits.  He had improvement in his diarrhea with the increase in octreotide. The chromogranin a was up slightly in May to 126.  INTERVAL HISTORY:  Glenn Bender is here today for repeat clinical assessment prior to a 10th dose of octreotide.  He reports occasional flushing and diarrhea, but better controlled than prior to taking octreotide. He states he has 2-3 loose stools a day.  His wife had contacted [place] earlier this month stating that he reported his heart racing , so I questioned him about this.  He states he occasionally wakes up feeling his heart race and feels this is due to anxiety.  He denies shortness of breath or chest pain.  She also reported him turning very red and having a sudden headache while out to eat one evening. This could have been an episode of flushing, which can be precipitated by eating, but headaches do not typically accompany flushing due to carcinoid.  He does not really recall the incident, but states he does not have frequent headaches.  He has not been to see his primary care and sometime, so I suggested he do so.  He states he has been  evaluated by Cardiology in the past and no problems were identified. He denies fevers or chills. He denies pain. His appetite is good. His weight has increased 4 pounds over last 2 months .  REVIEW OF SYSTEMS:  Review of Systems  Constitutional:  Negative for appetite change, chills, fatigue, fever and unexpected weight change.  HENT:   Negative for  lump/mass, mouth sores and sore throat.   Respiratory:  Negative for cough and shortness of breath.   Cardiovascular:  Negative for chest pain, leg swelling and palpitations.  Gastrointestinal:  Negative for abdominal pain, constipation, diarrhea, nausea and vomiting.  Genitourinary:  Negative for difficulty urinating, dysuria, frequency and hematuria.   Musculoskeletal:  Negative for arthralgias, back pain and myalgias.  Skin:  Negative for itching, rash and wound.  Neurological:  Negative for dizziness, extremity weakness, headaches, light-headedness and numbness.  Hematological:  Negative for adenopathy.  Psychiatric/Behavioral:  Negative for depression and sleep disturbance. The patient is not nervous/anxious.    VITALS:  Blood pressure (!) 158/84, pulse 72, temperature 97.9 F (36.6 C), temperature source Oral, resp. rate 18, height $RemoveBe'5\' 10"'CAjeDzObF$  (1.778 m), weight 252 lb 1.6 oz (114.4 kg), SpO2 95 %.  Wt Readings from Last 3 Encounters:  11/15/20 252 lb 1.6 oz (114.4 kg)  10/19/20 249 lb 0.6 oz (113 kg)  09/21/20 246 lb (111.6 kg)    Body mass index is 36.17 kg/m.  Performance status (ECOG): 0 - Asymptomatic  PHYSICAL EXAM:  Physical Exam Vitals and nursing note reviewed.  Constitutional:      General: He is not in acute distress.    Appearance: Normal appearance. He is normal weight.  HENT:     Head: Normocephalic and atraumatic.     Mouth/Throat:     Mouth: Mucous membranes are moist.     Pharynx: Oropharynx is clear. No oropharyngeal exudate or posterior oropharyngeal erythema.  Eyes:     General: No scleral icterus.    Extraocular Movements: Extraocular movements intact.     Conjunctiva/sclera: Conjunctivae normal.     Pupils: Pupils are equal, round, and reactive to light.  Cardiovascular:     Rate and Rhythm: Normal rate and regular rhythm. Occasional Extrasystoles are present.    Heart sounds: Normal heart sounds. No murmur heard.   No friction rub. No gallop.   Pulmonary:     Effort: Pulmonary effort is normal.     Breath sounds: Normal breath sounds. No wheezing, rhonchi or rales.  Chest:  Breasts:    Right: No axillary adenopathy or supraclavicular adenopathy.     Left: No axillary adenopathy or supraclavicular adenopathy.  Abdominal:     General: Bowel sounds are normal. There is no distension.     Palpations: Abdomen is soft. There is no hepatomegaly, splenomegaly or mass.     Tenderness: There is no abdominal tenderness.  Musculoskeletal:        General: Normal range of motion.     Cervical back: Normal range of motion and neck supple. No tenderness.     Right lower leg: No edema.     Left lower leg: No edema.  Lymphadenopathy:     Cervical: No cervical adenopathy.     Upper Body:     Right upper body: No supraclavicular or axillary adenopathy.     Left upper body: No supraclavicular or axillary adenopathy.     Lower Body: No right inguinal adenopathy. No left inguinal adenopathy.  Skin:    General: Skin is warm and dry.  Coloration: Skin is not jaundiced.     Findings: No rash.  Neurological:     Mental Status: He is alert and oriented to person, place, and time.     Cranial Nerves: No cranial nerve deficit.  Psychiatric:        Mood and Affect: Mood normal.        Behavior: Behavior normal.        Thought Content: Thought content normal.   LABS:   CBC Latest Ref Rng & Units 09/20/2020 07/26/2020 06/15/2020  WBC - 6.6 6.4 9.0  Hemoglobin 13.5 - 17.5 13.9 13.6 12.0(L)  Hematocrit 41 - 53 40(A) 40(A) 38.4(L)  Platelets 150 - 399 136(A) 163 128(L)   CMP Latest Ref Rng & Units 09/20/2020 07/26/2020 06/15/2020  Glucose 70 - 99 mg/dL - - 122(H)  BUN 4 - $R'21 17 16 17  'cc$ Creatinine 0.6 - 1.3 1.4(A) 1.4(A) 1.54(H)  Sodium 137 - 147 139 140 139  Potassium 3.4 - 5.3 3.8 3.8 3.8  Chloride 99 - 108 109(A) 112(A) 106  CO2 13 - $Re'22 22 18 28  'cEa$ Calcium 8.7 - 10.7 8.3(A) 8.3(A) 8.2(L)  Total Protein 6.5 - 8.1 g/dL - - -  Total Bilirubin  0.3 - 1.2 mg/dL - - -  Alkaline Phos 25 - 125 55 60 -  AST 14 - 40 29 42(A) -  ALT 10 - 40 42(A) 44(A) -     No results found for: CEA1 / No results found for: CEA1 No results found for: PSA1 No results found for: EPP295 No results found for: JOA416  No results found for: TOTALPROTELP, ALBUMINELP, A1GS, A2GS, BETS, BETA2SER, GAMS, MSPIKE, SPEI No results found for: TIBC, FERRITIN, IRONPCTSAT No results found for: LDH  STUDIES:  No results found.    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[place])    Malignant nonargentaffin carcinoid tumor ([facility])    Secondary malignant carcinoid tumor of liver [facility])     Past Surgical History:  Procedure Laterality Date   CYSTOTOMY  01/2020   IR URETERAL STENT LEFT NEW ACCESS W/O SEP NEPHROSTOMY CATH  06/13/2020   IR URETERAL STENT PLACEMENT EXISTING ACCESS LEFT  06/15/2020   LYMPH NODE BIOPSY Left    supraclavicular   NEPHROLITHOTOMY Left 06/13/2020   Procedure: NEPHROLITHOTOMY PERCUTANEOUS;  Surgeon: Robley Fries, MD;  Location: [facility];  Service: Urology;  Laterality: Left;  2 HRS   ORCHIECTOMY  6063   UMBILICAL HERNIA REPAIR      Family History  Problem Relation Age of Onset   Cancer Mother        jaw bone   Throat cancer Father        smoker    Social History:  reports that he quit smoking about 25 years ago. He has a 4.75 pack-year smoking history. He has never used smokeless tobacco. He reports current alcohol use. He reports that he does not use drugs.The patient is alone today.  Allergies: No Known Allergies  Current Medications: Current Outpatient Medications  Medication Sig Dispense Refill   octreotide (SANDOSTATIN LAR) 20 MG injection Inject 20 mg into the muscle every 28  (twenty-eight) days.     No current  facility-administered medications for this visit.     ASSESSMENT & PLAN:   Assessment:   1. 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  He does have a terminal ileum mass measuring 4.0 x 2.8 x 3.7 cm, and a mass of the mesentery of the small intestine measuring 3.9 x 2.3 x 4.0 cm.  As this has already metastasized, surgical resection is not indicated.  24 hour urine for 5 HIAA was quite elevated at 86.8, confirming carcinoid syndrome.  Chromogranin A was also elevated at 214. He started treatment with octreotide injections in October 2021 and continues to tolerate this without difficulty.  Due to the persistent diarrhea, we increased the octreotide to 30 mg every 4 weeks. CT imaging and February was stable to improved. Chromogranin A He is due for repeat imaging in August.   2. Multiple liver masses, the largest measuring 4.0 x 4.3 cm, consistent with metastatic carcinoid with carcinoid syndrome, which were stable on CT imaging in February.  There is mild elevation of the ALT today.   3. History of testicular cancer diagnosed in 1996, treated with orchiectomy and chemotherapy.    4. Diarrhea, secondary to carcinoid malignancy.  This has improved with increasing octreotide to 30 mg.  Plan:    He will continue octreotide 30 mg monthly.  We will plan to see him back in 1 month with a CBC, comprehensive metabolic panel, and chromogranin A, as well as repeat CT chest, abdomen and pelvis to reassess his disease baseline. The patient understands the plans discussed today and is in agreement with them.  He knows to contact our office if he develops concerns prior to his next appointment.      Marvia Pickles, PA-C

## 2020-11-15 ENCOUNTER — Inpatient Hospital Stay: Payer: 59 | Attending: Oncology

## 2020-11-15 ENCOUNTER — Encounter: Payer: Self-pay | Admitting: Hematology and Oncology

## 2020-11-15 ENCOUNTER — Inpatient Hospital Stay (INDEPENDENT_AMBULATORY_CARE_PROVIDER_SITE_OTHER): Payer: 59 | Admitting: Hematology and Oncology

## 2020-11-15 ENCOUNTER — Other Ambulatory Visit: Payer: Self-pay

## 2020-11-15 VITALS — BP 158/84 | HR 72 | Temp 97.9°F | Resp 18 | Ht 70.0 in | Wt 252.1 lb

## 2020-11-15 DIAGNOSIS — C7B8 Other secondary neuroendocrine tumors: Secondary | ICD-10-CM | POA: Diagnosis not present

## 2020-11-15 DIAGNOSIS — E340 Carcinoid syndrome: Secondary | ICD-10-CM | POA: Insufficient documentation

## 2020-11-15 DIAGNOSIS — C7A00 Malignant carcinoid tumor of unspecified site: Secondary | ICD-10-CM | POA: Diagnosis not present

## 2020-11-15 DIAGNOSIS — C7B02 Secondary carcinoid tumors of liver: Secondary | ICD-10-CM | POA: Insufficient documentation

## 2020-11-15 LAB — BASIC METABOLIC PANEL
BUN: 22 — AB (ref 4–21)
CO2: 20 (ref 13–22)
Chloride: 108 (ref 99–108)
Creatinine: 1.5 — AB (ref 0.6–1.3)
Glucose: 147
Potassium: 3.7 (ref 3.4–5.3)
Sodium: 140 (ref 137–147)

## 2020-11-15 LAB — CBC AND DIFFERENTIAL
HCT: 40 — AB (ref 41–53)
Hemoglobin: 14 (ref 13.5–17.5)
Neutrophils Absolute: 4.82
Platelets: 133 — AB (ref 150–399)
WBC: 6.6

## 2020-11-15 LAB — CBC: RBC: 4.62 (ref 3.87–5.11)

## 2020-11-15 LAB — COMPREHENSIVE METABOLIC PANEL
Albumin: 4.1 (ref 3.5–5.0)
Calcium: 8.2 — AB (ref 8.7–10.7)

## 2020-11-15 LAB — HEPATIC FUNCTION PANEL
ALT: 58 — AB (ref 10–40)
AST: 42 — AB (ref 14–40)
Alkaline Phosphatase: 66 (ref 25–125)
Bilirubin, Total: 0.7

## 2020-11-16 ENCOUNTER — Inpatient Hospital Stay: Payer: 59

## 2020-11-16 ENCOUNTER — Other Ambulatory Visit: Payer: Self-pay | Admitting: Pharmacist

## 2020-11-16 VITALS — BP 134/74 | HR 76 | Temp 98.2°F | Resp 18 | Ht 70.0 in | Wt 254.0 lb

## 2020-11-16 DIAGNOSIS — C7B02 Secondary carcinoid tumors of liver: Secondary | ICD-10-CM | POA: Diagnosis present

## 2020-11-16 DIAGNOSIS — C7A00 Malignant carcinoid tumor of unspecified site: Secondary | ICD-10-CM | POA: Diagnosis present

## 2020-11-16 DIAGNOSIS — E340 Carcinoid syndrome: Secondary | ICD-10-CM | POA: Diagnosis present

## 2020-11-16 DIAGNOSIS — C7B8 Other secondary neuroendocrine tumors: Secondary | ICD-10-CM

## 2020-11-16 MED ORDER — OCTREOTIDE ACETATE 30 MG IM KIT
30.0000 mg | PACK | Freq: Once | INTRAMUSCULAR | Status: AC
  Administered 2020-11-16: 30 mg via INTRAMUSCULAR

## 2020-11-16 MED ORDER — OCTREOTIDE ACETATE 30 MG IM KIT
PACK | INTRAMUSCULAR | Status: AC
  Filled 2020-11-16: qty 1

## 2020-11-16 NOTE — Patient Instructions (Signed)
Octreotide injection solution What is this medication? OCTREOTIDE (ok TREE oh tide) is used to reduce blood levels of growth hormone in patients with a condition called acromegaly. This medicine also reduces flushing and watery diarrhea caused by certain types of cancer. This medicine may be used for other purposes; ask your health care provider or pharmacist if you have questions. COMMON BRAND NAME(S): Bynfezia, Sandostatin What should I tell my care team before I take this medication? They need to know if you have any of these conditions: diabetes gallbladder disease kidney disease liver disease thyroid disease an unusual or allergic reaction to octreotide, other medicines, foods, dyes, or preservatives pregnant or trying to get pregnant breast-feeding How should I use this medication? This medicine is for injection under the skin or into a vein (only in emergency situations). It is usually given by a health care professional in a hospital or clinic setting. If you get this medicine at home, you will be taught how to prepare and give this medicine. Allow the injection solution to come to room temperature before use. Do not warm it artificially. Use exactly as directed. Take your medicine at regular intervals. Do not take your medicine more often than directed. It is important that you put your used needles and syringes in a special sharps container. Do not put them in a trash can. If you do not have a sharps container, call your pharmacist or healthcare provider to get one. Talk to your pediatrician regarding the use of this medicine in children. Special care may be needed. Overdosage: If you think you have taken too much of this medicine contact a poison control center or emergency room at once. NOTE: This medicine is only for you. Do not share this medicine with others. What if I miss a dose? If you miss a dose, take it as soon as you can. If it is almost time for your next dose, take only  that dose. Do not take double or extra doses. What may interact with this medication? bromocriptine certain medicines for blood pressure, heart disease, irregular heartbeat cyclosporine diuretics medicines for diabetes, including insulin quinidine This list may not describe all possible interactions. Give your health care provider a list of all the medicines, herbs, non-prescription drugs, or dietary supplements you use. Also tell them if you smoke, drink alcohol, or use illegal drugs. Some items may interact with your medicine. What should I watch for while using this medication? Visit your doctor or health care professional for regular checks on your progress. To help reduce irritation at the injection site, use a different site for each injection and make sure the solution is at room temperature before use. This medicine may cause decreases in blood sugar. Signs of low blood sugar include chills, cool, pale skin or cold sweats, drowsiness, extreme hunger, fast heartbeat, headache, nausea, nervousness or anxiety, shakiness, trembling, unsteadiness, tiredness, or weakness. Contact your doctor or health care professional right away if you experience any of these symptoms. This medicine may increase blood sugar. Ask your healthcare provider if changes in diet or medicines are needed if you have diabetes. This medicine may cause a decrease in vitamin B12. You should make sure that you get enough vitamin B12 while you are taking this medicine. Discuss the foods you eat and the vitamins you take with your health care professional. What side effects may I notice from receiving this medication? Side effects that you should report to your doctor or health care professional as soon as   possible: allergic reactions like skin rash, itching or hives, swelling of the face, lips, or tongue fast, slow, or irregular heartbeat right upper belly pain severe stomach pain signs and symptoms of high blood sugar such  as being more thirsty or hungry or having to urinate more than normal. You may also feel very tired or have blurry vision. signs and symptoms of low blood sugar such as feeling anxious; confusion; dizziness; increased hunger; unusually weak or tired; increased sweating; shakiness; cold, clammy skin; irritable; headache; blurred vision; fast heartbeat; loss of consciousness unusually weak or tired Side effects that usually do not require medical attention (report to your doctor or health care professional if they continue or are bothersome): diarrhea dizziness gas headache nausea, vomiting pain, redness, or irritation at site where injected upset stomach This list may not describe all possible side effects. Call your doctor for medical advice about side effects. You may report side effects to FDA at 1-800-FDA-1088. Where should I keep my medication? Keep out of the reach of children. Store in a refrigerator between 2 and 8 degrees C (36 and 46 degrees F). Protect from light. Allow to come to room temperature naturally. Do not use artificial heat. If protected from light, the injection may be stored at room temperature between 20 and 30 degrees C (70 and 86 degrees F) for 14 days. After the initial use, throw away any unused portion of a multiple dose vial after 14 days. Throw away unused portions of the ampules after use. NOTE: This sheet is a summary. It may not cover all possible information. If you have questions about this medicine, talk to your doctor, pharmacist, or health care provider.  2022 Elsevier/Gold Standard (2018-11-12 13:33:09)  

## 2020-11-17 ENCOUNTER — Encounter: Payer: Self-pay | Admitting: Hematology and Oncology

## 2020-11-17 ENCOUNTER — Encounter: Payer: Self-pay | Admitting: Oncology

## 2020-12-06 NOTE — Progress Notes (Signed)
[facility]  [address] [place],  [place]  [address] [phone]  Clinic Day:  12/13/2020  Referring physician: Jeanie Sewer, NP  This document serves as a record of services personally performed by Hosie Poisson, MD. It was created on their behalf by Curry,Lauren E, a trained medical scribe. The creation of this record is based on the scribe's personal observations and the provider's statements to them.  CHIEF COMPLAINT:  CC:  Metastatic carcinoid to liver  Current Treatment:   Octreotide 30 mg IM monthly   HISTORY OF PRESENT ILLNESS:  Glenn Bender is a 60 y.o. male with metastatic carcinoid to liver diagnosed in September 2021.  The patient presented in September with hematuria, felt to be due to a kidney stone.  CT abdomen and pelvis revealed multiple liver masses, the largest measuring 4.0 x 4.3 cm, a terminal ileum mass measuring 4.0 x 2.8 x 3.7 cm, and a mass of the mesentery of the small intestine measuring 3.9 x 2.3 x 4.0 cm.  Diagnostic colonoscopy with Dr. Coralie Keens in September revealed a large mucosal covered mass protruding through the ileocecal valve with peristalsis.  Biopsy was collected and surgical pathology was benign.  Liver biopsy revealed metastatic neuroendocrine tumor, grade 2, consistent with gastrointestinal primary. Synaptophysin and chromogranin-A immunostains were positive as well as CDX-2 supporting gastrointestinal origin.  Ki67 was 6%.  He reports flushing of his face with alcohol and certain foods.  He reported diarrhea, as well as flushing.  24 hour urine for 5HIAA was also elevated at 86.8, which is consistent with carcinoid.  Chromogranin A was elevated at 214 in October and came down to 116.9 in December.  CT chest from November 2021 revealed left paratracheal adenopathy and pleural nodularity along the right hemidiaphragm measuring up to 12 mm, worrisome for metastatic disease. As he had metastatic disease,  resection of his primary was not recommended. The patient was started on octreotide 20 mg IM monthly in October.  Octreotide was subsequently increased to 30 mg monthly in December due to persistent diarrhea.   CT chest, abdomen and pelvis from February 2022 revealed mostly stable to slightly decreased appearance of the carcinoid tumor, including the mesenteric lesion with cicatricial reaction, adjacent wall.  There was increased nodularity in a bandlike fashion along the mesenteric vessels adjacent to the carcinoid tumor. The liver lesions were stable with no new lesions identified. There was stable nodularity/lobularity along the right hemidiaphragm.  He has had a continued decrease in the Chromogranin A, which was 111.4 in February, and then 100.3 in March, which is within normal limits.  He had improvement in his diarrhea with the increase in octreotide. The chromogranin A was up slightly in May to 126.  INTERVAL HISTORY:  Glenn Bender is here for routine follow up prior to an 11th dose of octreotide.  CT chest, abdomen and pelvis from August 15th revealed no new or progressive metastatic disease.  Foci of masslike wall thickening within a central right abdominal small bowel loop and at the terminal ileum are stable. Calcified central mesenteric adenopathy is stable. Findings are suggestive of multifocal small bowel carcinoid with conglomerate mesenteric nodal metastases.  Widespread hyperenhancing liver metastases are stable.  Stable clustered subpleural solid pulmonary nodules along the right hemidiaphragm in the basilar right lower lobe are compatible with pulmonary metastases.  There is new background diffuse hepatic steatosis.  Blood counts and chemistries are unremarkable except for a creatinine of 1.7, previously 1.5, and a BUN of 22.  Chromogranin A has decreased from 126 in May to 107.5.  He states that he has been well and denies complaints today other than occasional right flank spasm.   His  appetite is  good, and his weight is stable since his last visit.  He denies fever, chills or other signs of infection.  He denies nausea, vomiting, bowel issues, or abdominal pain.  He denies sore throat, cough, dyspnea, or chest pain.  REVIEW OF SYSTEMS:  Review of Systems  Constitutional: Negative.  Negative for appetite change, chills, fatigue, fever and unexpected weight change.  HENT:  Negative.    Eyes: Negative.   Respiratory: Negative.  Negative for chest tightness, cough, hemoptysis, shortness of breath and wheezing.   Cardiovascular: Negative.  Negative for chest pain, leg swelling and palpitations.  Gastrointestinal: Negative.  Negative for abdominal distention, abdominal pain, blood in stool, constipation, diarrhea, nausea and vomiting.  Endocrine: Negative.   Genitourinary:  Negative for difficulty urinating, dysuria, frequency and hematuria.   Musculoskeletal:  Positive for flank pain (occasional spasm of the right). Negative for arthralgias, back pain, gait problem and myalgias.  Skin: Negative.   Neurological: Negative.  Negative for dizziness, extremity weakness, gait problem, headaches, light-headedness, numbness, seizures and speech difficulty.  Hematological: Negative.   Psychiatric/Behavioral: Negative.  Negative for depression and sleep disturbance. The patient is not nervous/anxious.   All other systems reviewed and are negative.  VITALS:  Blood pressure 138/80, pulse 61, temperature 97.7 F (36.5 C), temperature source Oral, resp. rate 18, height _0  (1.778 m), weight 252 lb 6.4 oz (114.5 kg), SpO2 96 %.  Wt Readings from Last 3 Encounters:  12/13/20 252 lb 6.4 oz (114.5 kg)  11/16/20 254 lb (115.2 kg)  11/15/20 252 lb 1.6 oz (114.4 kg)    Body mass index is 36.22 kg/m.  Performance status (ECOG): 0 - Asymptomatic  PHYSICAL EXAM:  Physical Exam Constitutional:      General: He is not in acute distress.    Appearance: Normal appearance. He is normal weight.  HENT:      Head: Normocephalic and atraumatic.  Eyes:     General: No scleral icterus.    Extraocular Movements: Extraocular movements intact.     Conjunctiva/sclera: Conjunctivae normal.     Pupils: Pupils are equal, round, and reactive to light.  Cardiovascular:     Rate and Rhythm: Normal rate and regular rhythm.     Pulses: Normal pulses.     Heart sounds: Normal heart sounds. No murmur heard.   No friction rub. No gallop.  Pulmonary:     Effort: Pulmonary effort is normal. No respiratory distress.     Breath sounds: Normal breath sounds.  Abdominal:     General: Bowel sounds are normal. There is no distension.     Palpations: Abdomen is soft. There is hepatomegaly (measuring 7 cm below the right costal margin). There is no splenomegaly or mass.     Tenderness: There is no abdominal tenderness.  Musculoskeletal:        General: Normal range of motion.     Cervical back: Normal range of motion and neck supple.     Right lower leg: Edema (trace) present.     Left lower leg: Edema (trace) present.  Lymphadenopathy:     Cervical: No cervical adenopathy.  Skin:    General: Skin is warm and dry.  Neurological:     General: No focal deficit present.     Mental Status: He is alert and  oriented to person, place, and time. Mental status is at baseline.  Psychiatric:        Mood and Affect: Mood normal.        Behavior: Behavior normal.        Thought Content: Thought content normal.        Judgment: Judgment normal.   LABS:   CBC Latest Ref Rng & Units 12/11/2020 11/15/2020 09/20/2020  WBC - 6.3 6.6 6.6  Hemoglobin 13.5 - 17.5 13.9 14.0 13.9  Hematocrit 41 - 53 41 40(A) 40(A)  Platelets 150 - 399 127(A) 133(A) 136(A)   CMP Latest Ref Rng & Units 12/11/2020 11/15/2020 09/20/2020  Glucose 70 - 99 mg/dL - - -  BUN 4 - 21 22(A) 22(A) 17  Creatinine 0.6 - 1.3 1.7(A) 1.5(A) 1.4(A)  Sodium 137 - 147 137 140 139  Potassium 3.4 - 5.3 4.2 3.7 3.8  Chloride 99 - 108 105 108 109(A)  CO2 13 - 22  23(A) 20 22  Calcium 8.7 - 10.7 8.6(A) 8.2(A) 8.3(A)  Total Protein 6.5 - 8.1 g/dL - - -  Total Bilirubin 0.3 - 1.2 mg/dL - - -  Alkaline Phos 25 - 125 52 66 55  AST 14 - 40 38 42(A) 29  ALT 10 - 40 50(A) 58(A) 42(A)     No results found for: CEA1 / No results found for: CEA1 No results found for: PSA1 No results found for: HUT654 No results found for: YTK354  No results found for: TOTALPROTELP, ALBUMINELP, A1GS, A2GS, BETS, BETA2SER, GAMS, MSPIKE, SPEI No results found for: TIBC, FERRITIN, IRONPCTSAT No results found for: LDH  STUDIES:  No results found.   EXAM: 12/11/2020 CT CHEST, ABDOMEN, AND PELVIS WITH CONTRAST  TECHNIQUE: Multidetector CT imaging of the chest, abdomen and pelvis was performed following the standard protocol during bolus administration of intravenous contrast.  CONTRAST:  100 cc Isovue 370 IV.  COMPARISON:  05/30/2020 CT chest, abdomen and pelvis.  FINDINGS: CT CHEST FINDINGS  Cardiovascular: Normal heart size. No significant pericardial effusion/thickening. Left anterior descending and right coronary atherosclerosis. Atherosclerotic nonaneurysmal thoracic aorta. Top-normal caliber main pulmonary artery (3.1 cm diameter). No central pulmonary emboli.  Mediastinum/Nodes: No discrete thyroid nodules. Unremarkable esophagus. No axillary adenopathy. Borderline prominent 0.7 cm right pericardiophrenic node (series 2/image 45), stable. Clustered mildly enlarged high left paratracheal nodes, largest 1.1 cm (series 2/image 12), all stable. No new pathologically enlarged mediastinal nodes. No hilar adenopathy.  Lungs/Pleura: No pneumothorax. No pleural effusion. Several clustered subpleural solid nodules along the right hemidiaphragm in the basilar right lower lobe, largest 1.8 cm (series 4/image 94), previously 1.8 cm, all stable. No acute consolidative airspace disease or new significant pulmonary nodules.  Musculoskeletal: No aggressive  appearing focal osseous lesions. Mild thoracic spondylosis.  CT ABDOMEN PELVIS FINDINGS  Hepatobiliary: Several (at least 10) hyperenhancing liver masses scattered throughout the liver, not substantially changed. Representative segment 2 left liver 5.1 x 4.3 cm mass (series 2/image 50), previously 5.0 x 4.2 cm. Representative 4.2 x 4.1 cm right liver dome mass (series 2/image 45), previously 4.3 x 4.1 cm. Representative bilobed 4.7 x 3.4 cm segment 7 right liver mass (series 2/image 54), previously 4.6 x 3.2 cm. New background diffuse hepatic steatosis. No definite new liver lesions. Normal gallbladder with no radiopaque cholelithiasis. No biliary ductal dilatation.  Pancreas: Normal, with no mass or duct dilation.  Spleen: Normal size. No mass.  Adrenals/Urinary Tract: Normal adrenals. Nonobstructing 11 mm lower left renal stone. No hydronephrosis. No renal  masses. Left UPJ 4 mm stone. Normal bladder.  Stomach/Bowel: Normal non-distended stomach. Masslike wall thickening within a central right abdominal small bowel loop measuring 2.8 x 2.7 cm (series 2/image 80), previously 2.9 x 2.4 cm, not appreciably changed, with stable mild upstream small bowel dilatation. Eccentric masslike wall thickening in the terminal ileum measuring 2.8 x 1.9 cm (series 2/image 90), previously 2.8 x 1.9 cm, not appreciably changed. Oral contrast transits to the colon. Normal appendix. Normal large bowel with no diverticulosis, large bowel wall thickening or pericolonic fat stranding.  Vascular/Lymphatic: Atherosclerotic nonaneurysmal abdominal aorta. Patent portal, splenic, hepatic and renal veins. Conglomerate calcified central mesenteric adenopathy measuring 3.6 x 3.4 cm (series 2/image 83) with associated mild cicatrization new, previously 3.6 x 3.3 cm, not appreciably changed. No new pathologically enlarged lymph nodes in the abdomen or pelvis.  Reproductive: Normal size prostate.  Other: No  pneumoperitoneum, ascites or focal fluid collection.  Musculoskeletal: No aggressive appearing focal osseous lesions. Mild-to-moderate lumbar spondylosis.  IMPRESSION: 1. No new or progressive metastatic disease in the chest, abdomen or pelvis. 2. Foci of masslike wall thickening within a central right abdominal small bowel loop and at the terminal ileum are stable. Calcified central mesenteric adenopathy is stable. Findings are suggestive of multifocal small bowel carcinoid with conglomerate mesenteric nodal metastases. 3. Widespread hyperenhancing liver metastases are stable. 4. Stable clustered subpleural solid pulmonary nodules along the right hemidiaphragm in the basilar right lower lobe, compatible with pulmonary metastases. 5. New background diffuse hepatic steatosis. 6. Nonobstructing left nephrolithiasis. Left UPJ 4 mm stone. No hydronephrosis. 7. Aortic Atherosclerosis (ICD10-I70.0).   HISTORY:   Allergies: No Known Allergies  Current Medications: Current Outpatient Medications  Medication Sig Dispense Refill   octreotide (SANDOSTATIN LAR) 20 MG injection Inject 20 mg into the muscle every 28 (twenty-eight) days.     No current facility-administered medications for this visit.     ASSESSMENT & PLAN:   Assessment:  1. 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  He does have a terminal ileum mass measuring 4.0 x 2.8 x 3.7 cm, and a mass of the mesentery of the small intestine measuring 3.9 x 2.3 x 4.0 cm.  As this has already metastasized, surgical resection is not indicated.  24 hour urine for 5 HIAA was quite elevated at 86.8, confirming carcinoid syndrome.  Chromogranin A was also elevated at 214. He started treatment with octreotide injections in October 2021 and continues to tolerate this without difficulty.  Due to the persistent diarrhea, we increased the octreotide to 30 mg every 4 weeks. CT imaging in August 2022 was  stable.   2. Multiple liver masses, the largest measuring 4.0 x 4.3 cm, consistent with metastatic carcinoid with carcinoid syndrome, which were stable on CT imaging.     3. History of testicular cancer diagnosed in 1996, treated with orchiectomy and chemotherapy.    4. Diarrhea, secondary to carcinoid malignancy.  This has improved with increasing octreotide to 30 mg.  5.  Worsening renal function.  He denies being on any other medications and has not been taking NSAIDs.  I advised that he avoid ibuprofen and Aleeve, and follow up with Dr. Felipe Drone.  I will forward these reports to her and most likely she will recommend a nephrology referral.  6.  Renal lithiasis.  He still has one stone in the left kidney and one at the left UPJ.      Plan:     CT imaging remains stable, so  he will continue octreotide 30 mg monthly.  However, I am concerned of his worsening kidney function.  I advised that he follow up with Dr. Felipe Drone to discuss her recommendations.  Likely, he will need to be referred to a nephrologist.  We recommend that he push fluids.  Otherwise, he is doing well, so we will plan to see him back in 12 weeks with a CBC, comprehensive metabolic panel, and chromogranin A for repeat evaluation The patient understands the plans discussed today and is in agreement with them.  He knows to contact our office if he develops concerns prior to his next appointment.   I, Rita Ohara, am acting as scribe for Derwood Kaplan, MD  I have reviewed this report as typed by the medical scribe, and it is complete and accurate.  Hosie Poisson; MD [facility]

## 2020-12-11 LAB — BASIC METABOLIC PANEL
BUN: 22 — AB (ref 4–21)
CO2: 23 — AB (ref 13–22)
Chloride: 105 (ref 99–108)
Creatinine: 1.7 — AB (ref 0.6–1.3)
Glucose: 125
Potassium: 4.2 (ref 3.4–5.3)
Sodium: 137 (ref 137–147)

## 2020-12-11 LAB — HEPATIC FUNCTION PANEL
ALT: 50 — AB (ref 10–40)
AST: 38 (ref 14–40)
Alkaline Phosphatase: 52 (ref 25–125)
Bilirubin, Total: 0.7

## 2020-12-11 LAB — CBC: RBC: 4.58 (ref 3.87–5.11)

## 2020-12-11 LAB — CBC AND DIFFERENTIAL
HCT: 41 (ref 41–53)
Hemoglobin: 13.9 (ref 13.5–17.5)
Neutrophils Absolute: 4.41
Platelets: 127 — AB (ref 150–399)
WBC: 6.3

## 2020-12-11 LAB — COMPREHENSIVE METABOLIC PANEL
Albumin: 4 (ref 3.5–5.0)
Calcium: 8.6 — AB (ref 8.7–10.7)

## 2020-12-13 ENCOUNTER — Telehealth: Payer: Self-pay | Admitting: Oncology

## 2020-12-13 ENCOUNTER — Encounter: Payer: Self-pay | Admitting: Oncology

## 2020-12-13 ENCOUNTER — Encounter: Payer: Self-pay | Admitting: Hematology and Oncology

## 2020-12-13 ENCOUNTER — Other Ambulatory Visit: Payer: Self-pay

## 2020-12-13 ENCOUNTER — Inpatient Hospital Stay: Payer: 59 | Attending: Oncology | Admitting: Oncology

## 2020-12-13 VITALS — BP 138/80 | HR 61 | Temp 97.7°F | Resp 18 | Ht 70.0 in | Wt 252.4 lb

## 2020-12-13 DIAGNOSIS — C7B8 Other secondary neuroendocrine tumors: Secondary | ICD-10-CM | POA: Diagnosis not present

## 2020-12-13 DIAGNOSIS — C7A00 Malignant carcinoid tumor of unspecified site: Secondary | ICD-10-CM | POA: Diagnosis not present

## 2020-12-13 DIAGNOSIS — E340 Carcinoid syndrome: Secondary | ICD-10-CM | POA: Insufficient documentation

## 2020-12-13 DIAGNOSIS — Z79899 Other long term (current) drug therapy: Secondary | ICD-10-CM | POA: Insufficient documentation

## 2020-12-13 DIAGNOSIS — C7B02 Secondary carcinoid tumors of liver: Secondary | ICD-10-CM | POA: Insufficient documentation

## 2020-12-13 LAB — CHROMOGRANIN A: Chromogranin A: 107.5

## 2020-12-13 NOTE — Telephone Encounter (Signed)
Per 8/17 LOS, patient scheduled for Sept, Oct, Nov Appt's.  Gave patient Appt Summary

## 2020-12-14 ENCOUNTER — Inpatient Hospital Stay: Payer: 59

## 2020-12-14 ENCOUNTER — Telehealth: Payer: Self-pay

## 2020-12-14 VITALS — BP 131/73 | HR 59 | Temp 98.1°F | Resp 18 | Ht 70.0 in | Wt 254.0 lb

## 2020-12-14 DIAGNOSIS — C7B02 Secondary carcinoid tumors of liver: Secondary | ICD-10-CM | POA: Diagnosis present

## 2020-12-14 DIAGNOSIS — C7A00 Malignant carcinoid tumor of unspecified site: Secondary | ICD-10-CM | POA: Diagnosis present

## 2020-12-14 DIAGNOSIS — Z79899 Other long term (current) drug therapy: Secondary | ICD-10-CM | POA: Diagnosis not present

## 2020-12-14 DIAGNOSIS — E340 Carcinoid syndrome: Secondary | ICD-10-CM | POA: Diagnosis present

## 2020-12-14 DIAGNOSIS — C7B8 Other secondary neuroendocrine tumors: Secondary | ICD-10-CM

## 2020-12-14 MED ORDER — OCTREOTIDE ACETATE 30 MG IM KIT
30.0000 mg | PACK | Freq: Once | INTRAMUSCULAR | Status: AC
  Administered 2020-12-14: 30 mg via INTRAMUSCULAR
  Filled 2020-12-14: qty 1

## 2020-12-14 NOTE — Telephone Encounter (Addendum)
I notified pt's wife of Dr Remi Deter response below. She asked what the normal values of the creatinine supposed to be. I gave her the range. She will discuss further with his PCP.  ----- Message from Derwood Kaplan, MD sent at 12/14/2020  4:17 PM EDT ----- Regarding: RE: Office visit His kidney function is worse, creatinine 1.7 but not elevated BUN.  I can't explain why, not on any meds, scans & other labs look okay.  So I rec he f/u with his PCP & will prob ref to a nephrologist ----- Message ----- From: Dairl Ponder, RN Sent: 12/14/2020   3:56 PM EDT To: Derwood Kaplan, MD Subject: Office visit                                   Pt's wife,Robin, called to ask about what happened or what was Yehonatan told in the visit? Willie didn't understand the blood work.  [phone]

## 2020-12-14 NOTE — Patient Instructions (Signed)
Octreotide injection solution What is this medication? OCTREOTIDE (ok TREE oh tide) is used to reduce blood levels of growth hormone in patients with a condition called acromegaly. This medicine also reduces flushing and watery diarrhea caused by certain types of cancer. This medicine may be used for other purposes; ask your health care provider or pharmacist if you have questions. COMMON BRAND NAME(S): Bynfezia, Sandostatin What should I tell my care team before I take this medication? They need to know if you have any of these conditions: diabetes gallbladder disease kidney disease liver disease thyroid disease an unusual or allergic reaction to octreotide, other medicines, foods, dyes, or preservatives pregnant or trying to get pregnant breast-feeding How should I use this medication? This medicine is for injection under the skin or into a vein (only in emergency situations). It is usually given by a health care professional in a hospital or clinic setting. If you get this medicine at home, you will be taught how to prepare and give this medicine. Allow the injection solution to come to room temperature before use. Do not warm it artificially. Use exactly as directed. Take your medicine at regular intervals. Do not take your medicine more often than directed. It is important that you put your used needles and syringes in a special sharps container. Do not put them in a trash can. If you do not have a sharps container, call your pharmacist or healthcare provider to get one. Talk to your pediatrician regarding the use of this medicine in children. Special care may be needed. Overdosage: If you think you have taken too much of this medicine contact a poison control center or emergency room at once. NOTE: This medicine is only for you. Do not share this medicine with others. What if I miss a dose? If you miss a dose, take it as soon as you can. If it is almost time for your next dose, take only  that dose. Do not take double or extra doses. What may interact with this medication? bromocriptine certain medicines for blood pressure, heart disease, irregular heartbeat cyclosporine diuretics medicines for diabetes, including insulin quinidine This list may not describe all possible interactions. Give your health care provider a list of all the medicines, herbs, non-prescription drugs, or dietary supplements you use. Also tell them if you smoke, drink alcohol, or use illegal drugs. Some items may interact with your medicine. What should I watch for while using this medication? Visit your doctor or health care professional for regular checks on your progress. To help reduce irritation at the injection site, use a different site for each injection and make sure the solution is at room temperature before use. This medicine may cause decreases in blood sugar. Signs of low blood sugar include chills, cool, pale skin or cold sweats, drowsiness, extreme hunger, fast heartbeat, headache, nausea, nervousness or anxiety, shakiness, trembling, unsteadiness, tiredness, or weakness. Contact your doctor or health care professional right away if you experience any of these symptoms. This medicine may increase blood sugar. Ask your healthcare provider if changes in diet or medicines are needed if you have diabetes. This medicine may cause a decrease in vitamin B12. You should make sure that you get enough vitamin B12 while you are taking this medicine. Discuss the foods you eat and the vitamins you take with your health care professional. What side effects may I notice from receiving this medication? Side effects that you should report to your doctor or health care professional as soon as   possible: allergic reactions like skin rash, itching or hives, swelling of the face, lips, or tongue fast, slow, or irregular heartbeat right upper belly pain severe stomach pain signs and symptoms of high blood sugar such  as being more thirsty or hungry or having to urinate more than normal. You may also feel very tired or have blurry vision. signs and symptoms of low blood sugar such as feeling anxious; confusion; dizziness; increased hunger; unusually weak or tired; increased sweating; shakiness; cold, clammy skin; irritable; headache; blurred vision; fast heartbeat; loss of consciousness unusually weak or tired Side effects that usually do not require medical attention (report to your doctor or health care professional if they continue or are bothersome): diarrhea dizziness gas headache nausea, vomiting pain, redness, or irritation at site where injected upset stomach This list may not describe all possible side effects. Call your doctor for medical advice about side effects. You may report side effects to FDA at 1-800-FDA-1088. Where should I keep my medication? Keep out of the reach of children. Store in a refrigerator between 2 and 8 degrees C (36 and 46 degrees F). Protect from light. Allow to come to room temperature naturally. Do not use artificial heat. If protected from light, the injection may be stored at room temperature between 20 and 30 degrees C (70 and 86 degrees F) for 14 days. After the initial use, throw away any unused portion of a multiple dose vial after 14 days. Throw away unused portions of the ampules after use. NOTE: This sheet is a summary. It may not cover all possible information. If you have questions about this medicine, talk to your doctor, pharmacist, or health care provider.  2022 Elsevier/Gold Standard (2018-11-12 13:33:09)  

## 2020-12-16 ENCOUNTER — Encounter: Payer: Self-pay | Admitting: Oncology

## 2020-12-19 ENCOUNTER — Encounter: Payer: Self-pay | Admitting: Hematology and Oncology

## 2020-12-20 ENCOUNTER — Encounter: Payer: Self-pay | Admitting: Hematology and Oncology

## 2020-12-26 ENCOUNTER — Telehealth: Payer: Self-pay

## 2020-12-26 NOTE — Telephone Encounter (Signed)
Pt called to tell Dr Hinton Rao he had more labs drawn @ PCP. Colletta [place] Hudnell told him his kidney results were better.

## 2020-12-28 ENCOUNTER — Encounter: Payer: Self-pay | Admitting: Oncology

## 2021-01-11 ENCOUNTER — Inpatient Hospital Stay: Payer: 59 | Attending: Oncology

## 2021-01-11 ENCOUNTER — Other Ambulatory Visit: Payer: Self-pay

## 2021-01-11 VITALS — BP 149/81 | HR 63 | Temp 98.4°F | Resp 18 | Ht 70.0 in | Wt 254.0 lb

## 2021-01-11 DIAGNOSIS — C7A00 Malignant carcinoid tumor of unspecified site: Secondary | ICD-10-CM | POA: Diagnosis present

## 2021-01-11 DIAGNOSIS — Z79899 Other long term (current) drug therapy: Secondary | ICD-10-CM | POA: Diagnosis not present

## 2021-01-11 DIAGNOSIS — E340 Carcinoid syndrome: Secondary | ICD-10-CM | POA: Insufficient documentation

## 2021-01-11 DIAGNOSIS — C7B8 Other secondary neuroendocrine tumors: Secondary | ICD-10-CM

## 2021-01-11 DIAGNOSIS — C7B02 Secondary carcinoid tumors of liver: Secondary | ICD-10-CM | POA: Diagnosis present

## 2021-01-11 MED ORDER — OCTREOTIDE ACETATE 30 MG IM KIT
30.0000 mg | PACK | Freq: Once | INTRAMUSCULAR | Status: AC
  Administered 2021-01-11: 30 mg via INTRAMUSCULAR
  Filled 2021-01-11: qty 1

## 2021-01-11 NOTE — Patient Instructions (Signed)
Octreotide acetate injection suspension What is this medication? OCTREOTIDE (ok TREE oh tide) is used to reduce blood levels of growth hormone in patients with a condition called acromegaly. This medicine also reduces flushing and watery diarrhea caused by certain types of cancer. This medicine may be used for other purposes; ask your health care provider or pharmacist if you have questions. COMMON BRAND NAME(S): Sandostatin LAR What should I tell my care team before I take this medication? They need to know if you have any of these conditions: diabetes gallbladder disease kidney disease liver disease thyroid disease an unusual or allergic reaction to octreotide, other medicines, foods, dyes, or preservatives pregnant or trying to get pregnant breast-feeding How should I use this medication? This medicine is for injection into a muscle. It is usually given by a health care professional in a hospital or clinic setting. Talk to your pediatrician regarding the use of this medicine in children. Special care may be needed. Overdosage: If you think you have taken too much of this medicine contact a poison control center or emergency room at once. NOTE: This medicine is only for you. Do not share this medicine with others. What if I miss a dose? Keep appointments for follow-up doses. It is important not to miss your dose. Call your doctor or health care professional if you are unable to keep an appointment. What may interact with this medication? Do not take this medicine with any of the following medications: cisapride dronedarone flibanserin lutetium Lu 177 dotatate pimozide saquinavir thioridazine This medicine may also interact with the following medications: bromocriptine certain medicines for blood pressure, heart disease, irregular heartbeat cyclosporine diuretics medicines for diabetes, including insulin quinidine This list may not describe all possible interactions. Give your  health care provider a list of all the medicines, herbs, non-prescription drugs, or dietary supplements you use. Also tell them if you smoke, drink alcohol, or use illegal drugs. Some items may interact with your medicine. What should I watch for while using this medication? Visit your health care professional for regular checks on your progress. Tell your health care professional if your symptoms do not start to get better or if they get worse. This medicine may cause decreases in blood sugar. Signs of low blood sugar include chills, cool, pale skin or cold sweats, drowsiness, extreme hunger, fast heartbeat, headache, nausea, nervousness or anxiety, shakiness, trembling, unsteadiness, tiredness, or weakness. Contact your doctor or health care professional right away if you experience any of these symptoms. This medicine may increase blood sugar. Ask your healthcare provider if changes in diet or medicines are needed if you have diabetes. This medicine may cause a decrease in vitamin B12. You should make sure that you get enough vitamin B12 while you are taking this medicine. Discuss the foods you eat and the vitamins you take with your health care professional. What side effects may I notice from receiving this medication? Side effects that you should report to your doctor or health care professional as soon as possible: allergic reactions like skin rash, itching or hives, swelling of the face, lips, or tongue fast, slow, or irregular heartbeat right upper belly pain severe stomach pain signs and symptoms of high blood sugar such as being more thirsty or hungry or having to urinate more than normal. You may also feel very tired or have blurry vision. signs and symptoms of low blood sugar such as feeling anxious; confusion; dizziness; increased hunger; unusually weak or tired; increased sweating; shakiness; cold, clammy skin;   irritable; headache; blurred vision; fast heartbeat; loss of  consciousness unusually weak or tired Side effects that usually do not require medical attention (report these to your doctor or health care professional if they continue or are bothersome): diarrhea dizziness gas headache nausea, vomiting pain, redness, or irritation at site where injected upset stomach This list may not describe all possible side effects. Call your doctor for medical advice about side effects. You may report side effects to FDA at 1-800-FDA-1088. Where should I keep my medication? This medicine is given in a hospital or clinic and will not be stored at home. NOTE: This sheet is a summary. It may not cover all possible information. If you have questions about this medicine, talk to your doctor, pharmacist, or health care provider.  2022 Elsevier/Gold Standard (2019-08-03 18:31:50)

## 2021-01-11 NOTE — Progress Notes (Signed)
1335: PT STABLE AT TIME OF DISCHARGE  

## 2021-02-01 ENCOUNTER — Other Ambulatory Visit: Payer: Self-pay | Admitting: Pharmacist

## 2021-02-08 ENCOUNTER — Inpatient Hospital Stay: Payer: 59 | Attending: Oncology

## 2021-02-08 ENCOUNTER — Other Ambulatory Visit: Payer: Self-pay

## 2021-02-08 VITALS — BP 132/82 | HR 69 | Temp 98.9°F | Resp 18 | Ht 70.0 in | Wt 255.8 lb

## 2021-02-08 DIAGNOSIS — C7A00 Malignant carcinoid tumor of unspecified site: Secondary | ICD-10-CM | POA: Insufficient documentation

## 2021-02-08 DIAGNOSIS — E340 Carcinoid syndrome: Secondary | ICD-10-CM | POA: Diagnosis not present

## 2021-02-08 DIAGNOSIS — C7B02 Secondary carcinoid tumors of liver: Secondary | ICD-10-CM | POA: Diagnosis present

## 2021-02-08 DIAGNOSIS — C7B8 Other secondary neuroendocrine tumors: Secondary | ICD-10-CM

## 2021-02-08 MED ORDER — OCTREOTIDE ACETATE 30 MG IM KIT
30.0000 mg | PACK | Freq: Once | INTRAMUSCULAR | Status: AC
  Administered 2021-02-08: 30 mg via INTRAMUSCULAR
  Filled 2021-02-08: qty 1

## 2021-02-08 NOTE — Progress Notes (Signed)
1421: PT STABLE AT TIME OF DISCHARGE  

## 2021-02-08 NOTE — Patient Instructions (Signed)
Octreotide acetate injection suspension What is this medication? OCTREOTIDE (ok TREE oh tide) is used to reduce blood levels of growth hormone in patients with a condition called acromegaly. This medicine also reduces flushing and watery diarrhea caused by certain types of cancer. This medicine may be used for other purposes; ask your health care provider or pharmacist if you have questions. COMMON BRAND NAME(S): Sandostatin LAR What should I tell my care team before I take this medication? They need to know if you have any of these conditions: diabetes gallbladder disease kidney disease liver disease thyroid disease an unusual or allergic reaction to octreotide, other medicines, foods, dyes, or preservatives pregnant or trying to get pregnant breast-feeding How should I use this medication? This medicine is for injection into a muscle. It is usually given by a health care professional in a hospital or clinic setting. Talk to your pediatrician regarding the use of this medicine in children. Special care may be needed. Overdosage: If you think you have taken too much of this medicine contact a poison control center or emergency room at once. NOTE: This medicine is only for you. Do not share this medicine with others. What if I miss a dose? Keep appointments for follow-up doses. It is important not to miss your dose. Call your doctor or health care professional if you are unable to keep an appointment. What may interact with this medication? Do not take this medicine with any of the following medications: cisapride dronedarone flibanserin lutetium Lu 177 dotatate pimozide saquinavir thioridazine This medicine may also interact with the following medications: bromocriptine certain medicines for blood pressure, heart disease, irregular heartbeat cyclosporine diuretics medicines for diabetes, including insulin quinidine This list may not describe all possible interactions. Give your  health care provider a list of all the medicines, herbs, non-prescription drugs, or dietary supplements you use. Also tell them if you smoke, drink alcohol, or use illegal drugs. Some items may interact with your medicine. What should I watch for while using this medication? Visit your health care professional for regular checks on your progress. Tell your health care professional if your symptoms do not start to get better or if they get worse. This medicine may cause decreases in blood sugar. Signs of low blood sugar include chills, cool, pale skin or cold sweats, drowsiness, extreme hunger, fast heartbeat, headache, nausea, nervousness or anxiety, shakiness, trembling, unsteadiness, tiredness, or weakness. Contact your doctor or health care professional right away if you experience any of these symptoms. This medicine may increase blood sugar. Ask your healthcare provider if changes in diet or medicines are needed if you have diabetes. This medicine may cause a decrease in vitamin B12. You should make sure that you get enough vitamin B12 while you are taking this medicine. Discuss the foods you eat and the vitamins you take with your health care professional. What side effects may I notice from receiving this medication? Side effects that you should report to your doctor or health care professional as soon as possible: allergic reactions like skin rash, itching or hives, swelling of the face, lips, or tongue fast, slow, or irregular heartbeat right upper belly pain severe stomach pain signs and symptoms of high blood sugar such as being more thirsty or hungry or having to urinate more than normal. You may also feel very tired or have blurry vision. signs and symptoms of low blood sugar such as feeling anxious; confusion; dizziness; increased hunger; unusually weak or tired; increased sweating; shakiness; cold, clammy skin;   irritable; headache; blurred vision; fast heartbeat; loss of  consciousness unusually weak or tired Side effects that usually do not require medical attention (report these to your doctor or health care professional if they continue or are bothersome): diarrhea dizziness gas headache nausea, vomiting pain, redness, or irritation at site where injected upset stomach This list may not describe all possible side effects. Call your doctor for medical advice about side effects. You may report side effects to FDA at 1-800-FDA-1088. Where should I keep my medication? This medicine is given in a hospital or clinic and will not be stored at home. NOTE: This sheet is a summary. It may not cover all possible information. If you have questions about this medicine, talk to your doctor, pharmacist, or health care provider.  2022 Elsevier/Gold Standard (2019-08-03 18:31:50)

## 2021-03-07 ENCOUNTER — Encounter: Payer: Self-pay | Admitting: Hematology and Oncology

## 2021-03-07 ENCOUNTER — Inpatient Hospital Stay: Payer: 59 | Attending: Oncology

## 2021-03-07 ENCOUNTER — Telehealth: Payer: Self-pay | Admitting: Oncology

## 2021-03-07 ENCOUNTER — Inpatient Hospital Stay (INDEPENDENT_AMBULATORY_CARE_PROVIDER_SITE_OTHER): Payer: 59 | Admitting: Hematology and Oncology

## 2021-03-07 DIAGNOSIS — C7B02 Secondary carcinoid tumors of liver: Secondary | ICD-10-CM | POA: Insufficient documentation

## 2021-03-07 DIAGNOSIS — C7A00 Malignant carcinoid tumor of unspecified site: Secondary | ICD-10-CM | POA: Insufficient documentation

## 2021-03-07 DIAGNOSIS — R7989 Other specified abnormal findings of blood chemistry: Secondary | ICD-10-CM | POA: Diagnosis not present

## 2021-03-07 DIAGNOSIS — E340 Carcinoid syndrome: Secondary | ICD-10-CM | POA: Insufficient documentation

## 2021-03-07 DIAGNOSIS — C7B8 Other secondary neuroendocrine tumors: Secondary | ICD-10-CM

## 2021-03-07 DIAGNOSIS — C7B09 Secondary carcinoid tumors of other sites: Secondary | ICD-10-CM

## 2021-03-07 LAB — HEPATIC FUNCTION PANEL
ALT: 47 — AB (ref 10–40)
AST: 37 (ref 14–40)
Alkaline Phosphatase: 54 (ref 25–125)
Bilirubin, Total: 0.6

## 2021-03-07 LAB — CBC AND DIFFERENTIAL
HCT: 41 (ref 41–53)
Hemoglobin: 14.1 (ref 13.5–17.5)
Neutrophils Absolute: 5.48
Platelets: 136 — AB (ref 150–399)
WBC: 7.5

## 2021-03-07 LAB — BASIC METABOLIC PANEL
BUN: 19 (ref 4–21)
CO2: 23 — AB (ref 13–22)
Chloride: 110 — AB (ref 99–108)
Creatinine: 1.7 — AB (ref 0.6–1.3)
Glucose: 101
Potassium: 4 (ref 3.4–5.3)
Sodium: 142 (ref 137–147)

## 2021-03-07 LAB — COMPREHENSIVE METABOLIC PANEL
Albumin: 4.1 (ref 3.5–5.0)
Calcium: 8.5 — AB (ref 8.7–10.7)

## 2021-03-07 LAB — CBC: RBC: 4.71 (ref 3.87–5.11)

## 2021-03-07 MED ORDER — AZITHROMYCIN 250 MG PO TABS: ORAL_TABLET | ORAL | 0 refills | Status: DC

## 2021-03-07 NOTE — Assessment & Plan Note (Signed)
Creatinine today is 1.7 elevated from his past visit at 1.5. He reports passing a kidney stone and being told his functioning had improved. We discussed increasing his hydration throughout the day and keeping in touch with his PCP should further testing be needed.

## 2021-03-07 NOTE — Assessment & Plan Note (Signed)
He continues with octreotide for treatment of carcinoid tumor. He denies any signs or symptoms of concern for recurrence. He has some upper respiratory symptoms today with watery eyes, nasal drainage and productive cough with green phlegm. He denies fever or contact with COVID. I will send in zithromax today.

## 2021-03-07 NOTE — Telephone Encounter (Signed)
Per 11/9 LOS, patient scheduled for Feb 2023 Appt's plus Sandsotatin Injections.  Gave patient Appt Summary

## 2021-03-07 NOTE — Progress Notes (Signed)
Patient Care Team: Jeanie Sewer, NP as PCP - General (Family Medicine) Derwood Kaplan, MD as Consulting Physician (Oncology)  Clinic Day:  03/07/2021  Referring physician: Jeanie Sewer, NP  ASSESSMENT & PLAN:   Assessment & Plan: Malignant carcinoid tumor of unknown primary site [facility]) He continues with octreotide for treatment of carcinoid tumor. He denies any signs or symptoms of concern for recurrence. He has some upper respiratory symptoms today with watery eyes, nasal drainage and productive cough with green phlegm. He denies fever or contact with COVID. I will send in zithromax today.  Elevated serum creatinine Creatinine today is 1.7 elevated from his past visit at 1.5. He reports passing a kidney stone and being told his functioning had improved. We discussed increasing his hydration throughout the day and keeping in touch with his PCP should further testing be needed.   The patient understands the plans discussed today and is in agreement with them.  He knows to contact our office if he develops concerns prior to his next appointment.    Melodye Ped, NP  [place] [address] [place] [address] Dept: [phone] Dept Fax: [phone]   No orders of the defined types were placed in this encounter.     CHIEF COMPLAINT:  CC: A 60 year old male with history of carcinoid tumor here for 12 week evaluation  Current Treatment:  Octreotide monthly  INTERVAL HISTORY:  Sarp is here today for repeat clinical assessment. He denies fevers or chills. He denies pain. His appetite is good. His weight has been stable.  I have reviewed the past medical history, past surgical history, social history and family history with the patient and they are unchanged from previous note.  ALLERGIES:  has No Known Allergies.  MEDICATIONS:  Current Outpatient Medications  Medication Sig Dispense  Refill   azithromycin (ZITHROMAX) 250 MG tablet Take as directed on pack 6 each 0   octreotide (SANDOSTATIN LAR) 20 MG injection Inject 20 mg into the muscle every 28 (twenty-eight) days.     No current facility-administered medications for this visit.    HISTORY OF PRESENT ILLNESS:   Oncology History  Malignant carcinoid tumor of unknown primary site [facility])  02/11/2020 Initial Diagnosis   Malignant carcinoid tumor of unknown primary site [facility])       REVIEW OF SYSTEMS:   Constitutional: Denies fevers, chills or abnormal weight loss Eyes: Denies blurriness of vision Ears, nose, mouth, throat, and face: Denies mucositis or sore throat Respiratory: Denies cough, dyspnea or wheezes Cardiovascular: Denies palpitation, chest discomfort or lower extremity swelling Gastrointestinal:  Denies nausea, heartburn or change in bowel habits Skin: Denies abnormal skin rashes Lymphatics: Denies new lymphadenopathy or easy bruising Neurological:Denies numbness, tingling or new weaknesses Behavioral/Psych: Mood is stable, no new changes  All other systems were reviewed with the patient and are negative.   VITALS:  Blood pressure (!) 151/86, pulse 70, temperature 98.8 F (37.1 C), temperature source Oral, resp. rate 18, height 5\' 10"  (1.778 m), weight 253 lb 11.2 oz (115.1 kg), SpO2 95 %.  Wt Readings from Last 3 Encounters:  03/07/21 253 lb 11.2 oz (115.1 kg)  02/08/21 255 lb 12 oz (116 kg)  01/11/21 254 lb (115.2 kg)    Body mass index is 36.4 kg/m.  Performance status (ECOG): 1 - Symptomatic but completely ambulatory  PHYSICAL EXAM:   GENERAL:alert, no distress and comfortable SKIN: skin color, texture, turgor are normal, no rashes  or significant lesions EYES: normal, Conjunctiva are pink and non-injected, sclera clear OROPHARYNX:no exudate, no erythema and lips, buccal mucosa, and tongue normal  NECK: supple, thyroid normal size, non-tender, without nodularity LYMPH:  no palpable  lymphadenopathy in the cervical, axillary or inguinal LUNGS: clear to auscultation and percussion with normal breathing effort HEART: regular rate & rhythm and no murmurs and no lower extremity edema ABDOMEN:abdomen soft, non-tender and normal bowel sounds Musculoskeletal:no cyanosis of digits and no clubbing  NEURO: alert & oriented x 3 with fluent speech, no focal motor/sensory deficits  LABORATORY DATA:  I have reviewed the data as listed    Component Value Date/Time   NA 142 03/07/2021 0000   K 4.0 03/07/2021 0000   CL 110 (A) 03/07/2021 0000   CO2 23 (A) 03/07/2021 0000   GLUCOSE 122 (H) 06/15/2020 0454   BUN 19 03/07/2021 0000   CREATININE 1.7 (A) 03/07/2021 0000   CREATININE 1.54 (H) 06/15/2020 0454   CREATININE 1.46 (H) 03/17/2020 1536   CALCIUM 8.5 (A) 03/07/2021 0000   PROT 6.3 (L) 03/17/2020 1536   ALBUMIN 4.1 03/07/2021 0000   AST 37 03/07/2021 0000   AST 19 03/17/2020 1536   ALT 47 (A) 03/07/2021 0000   ALT 23 03/17/2020 1536   ALKPHOS 54 03/07/2021 0000   BILITOT 0.6 03/17/2020 1536   GFRNONAA 51 (L) 06/15/2020 0454   GFRNONAA 54 (L) 03/17/2020 1536    No results found for: SPEP, UPEP  Lab Results  Component Value Date   WBC 7.5 03/07/2021   NEUTROABS 5.48 03/07/2021   HGB 14.1 03/07/2021   HCT 41 03/07/2021   MCV 85 09/20/2020   PLT 136 (A) 03/07/2021      Chemistry      Component Value Date/Time   NA 142 03/07/2021 0000   K 4.0 03/07/2021 0000   CL 110 (A) 03/07/2021 0000   CO2 23 (A) 03/07/2021 0000   BUN 19 03/07/2021 0000   CREATININE 1.7 (A) 03/07/2021 0000   CREATININE 1.54 (H) 06/15/2020 0454   CREATININE 1.46 (H) 03/17/2020 1536   GLU 101 03/07/2021 0000      Component Value Date/Time   CALCIUM 8.5 (A) 03/07/2021 0000   ALKPHOS 54 03/07/2021 0000   AST 37 03/07/2021 0000   AST 19 03/17/2020 1536   ALT 47 (A) 03/07/2021 0000   ALT 23 03/17/2020 1536   BILITOT 0.6 03/17/2020 1536       RADIOGRAPHIC STUDIES: I have personally  reviewed the radiological images as listed and agreed with the findings in the report. No results found.

## 2021-03-08 ENCOUNTER — Other Ambulatory Visit: Payer: Self-pay

## 2021-03-08 ENCOUNTER — Inpatient Hospital Stay: Payer: 59

## 2021-03-08 VITALS — BP 145/78 | HR 68 | Temp 97.8°F | Resp 18 | Ht 70.0 in | Wt 252.1 lb

## 2021-03-08 DIAGNOSIS — C7B8 Other secondary neuroendocrine tumors: Secondary | ICD-10-CM

## 2021-03-08 DIAGNOSIS — C7A00 Malignant carcinoid tumor of unspecified site: Secondary | ICD-10-CM

## 2021-03-08 DIAGNOSIS — E340 Carcinoid syndrome: Secondary | ICD-10-CM | POA: Diagnosis not present

## 2021-03-08 LAB — CHROMOGRANIN A: Chromogranin A (ng/mL): 118.6 ng/mL — ABNORMAL HIGH (ref 0.0–101.8)

## 2021-03-08 MED ORDER — OCTREOTIDE ACETATE 30 MG IM KIT
30.0000 mg | PACK | Freq: Once | INTRAMUSCULAR | Status: AC
  Administered 2021-03-08: 30 mg via INTRAMUSCULAR
  Filled 2021-03-08: qty 1

## 2021-03-08 NOTE — Patient Instructions (Signed)
Octreotide injection solution What is this medication? OCTREOTIDE (ok TREE oh tide) is used to reduce blood levels of growth hormone in patients with a condition called acromegaly. This medicine also reduces flushing and watery diarrhea caused by certain types of cancer. This medicine may be used for other purposes; ask your health care provider or pharmacist if you have questions. COMMON BRAND NAME(S): Bynfezia, Sandostatin What should I tell my care team before I take this medication? They need to know if you have any of these conditions: diabetes gallbladder disease kidney disease liver disease thyroid disease an unusual or allergic reaction to octreotide, other medicines, foods, dyes, or preservatives pregnant or trying to get pregnant breast-feeding How should I use this medication? This medicine is for injection under the skin or into a vein (only in emergency situations). It is usually given by a health care professional in a hospital or clinic setting. If you get this medicine at home, you will be taught how to prepare and give this medicine. Allow the injection solution to come to room temperature before use. Do not warm it artificially. Use exactly as directed. Take your medicine at regular intervals. Do not take your medicine more often than directed. It is important that you put your used needles and syringes in a special sharps container. Do not put them in a trash can. If you do not have a sharps container, call your pharmacist or healthcare provider to get one. Talk to your pediatrician regarding the use of this medicine in children. Special care may be needed. Overdosage: If you think you have taken too much of this medicine contact a poison control center or emergency room at once. NOTE: This medicine is only for you. Do not share this medicine with others. What if I miss a dose? If you miss a dose, take it as soon as you can. If it is almost time for your next dose, take only  that dose. Do not take double or extra doses. What may interact with this medication? bromocriptine certain medicines for blood pressure, heart disease, irregular heartbeat cyclosporine diuretics medicines for diabetes, including insulin quinidine This list may not describe all possible interactions. Give your health care provider a list of all the medicines, herbs, non-prescription drugs, or dietary supplements you use. Also tell them if you smoke, drink alcohol, or use illegal drugs. Some items may interact with your medicine. What should I watch for while using this medication? Visit your doctor or health care professional for regular checks on your progress. To help reduce irritation at the injection site, use a different site for each injection and make sure the solution is at room temperature before use. This medicine may cause decreases in blood sugar. Signs of low blood sugar include chills, cool, pale skin or cold sweats, drowsiness, extreme hunger, fast heartbeat, headache, nausea, nervousness or anxiety, shakiness, trembling, unsteadiness, tiredness, or weakness. Contact your doctor or health care professional right away if you experience any of these symptoms. This medicine may increase blood sugar. Ask your healthcare provider if changes in diet or medicines are needed if you have diabetes. This medicine may cause a decrease in vitamin B12. You should make sure that you get enough vitamin B12 while you are taking this medicine. Discuss the foods you eat and the vitamins you take with your health care professional. What side effects may I notice from receiving this medication? Side effects that you should report to your doctor or health care professional as soon as   possible: allergic reactions like skin rash, itching or hives, swelling of the face, lips, or tongue fast, slow, or irregular heartbeat right upper belly pain severe stomach pain signs and symptoms of high blood sugar such  as being more thirsty or hungry or having to urinate more than normal. You may also feel very tired or have blurry vision. signs and symptoms of low blood sugar such as feeling anxious; confusion; dizziness; increased hunger; unusually weak or tired; increased sweating; shakiness; cold, clammy skin; irritable; headache; blurred vision; fast heartbeat; loss of consciousness unusually weak or tired Side effects that usually do not require medical attention (report to your doctor or health care professional if they continue or are bothersome): diarrhea dizziness gas headache nausea, vomiting pain, redness, or irritation at site where injected upset stomach This list may not describe all possible side effects. Call your doctor for medical advice about side effects. You may report side effects to FDA at 1-800-FDA-1088. Where should I keep my medication? Keep out of the reach of children. Store in a refrigerator between 2 and 8 degrees C (36 and 46 degrees F). Protect from light. Allow to come to room temperature naturally. Do not use artificial heat. If protected from light, the injection may be stored at room temperature between 20 and 30 degrees C (70 and 86 degrees F) for 14 days. After the initial use, throw away any unused portion of a multiple dose vial after 14 days. Throw away unused portions of the ampules after use. NOTE: This sheet is a summary. It may not cover all possible information. If you have questions about this medicine, talk to your doctor, pharmacist, or health care provider.  2022 Elsevier/Gold Standard (2018-11-12 00:00:00)  

## 2021-04-05 ENCOUNTER — Inpatient Hospital Stay: Payer: 59 | Attending: Oncology

## 2021-04-05 ENCOUNTER — Other Ambulatory Visit: Payer: Self-pay

## 2021-04-05 VITALS — BP 140/83 | HR 69 | Temp 97.7°F | Resp 18 | Wt 257.0 lb

## 2021-04-05 DIAGNOSIS — C7B02 Secondary carcinoid tumors of liver: Secondary | ICD-10-CM | POA: Insufficient documentation

## 2021-04-05 DIAGNOSIS — E340 Carcinoid syndrome: Secondary | ICD-10-CM | POA: Diagnosis not present

## 2021-04-05 DIAGNOSIS — C7B8 Other secondary neuroendocrine tumors: Secondary | ICD-10-CM

## 2021-04-05 DIAGNOSIS — C7A00 Malignant carcinoid tumor of unspecified site: Secondary | ICD-10-CM | POA: Insufficient documentation

## 2021-04-05 MED ORDER — OCTREOTIDE ACETATE 30 MG IM KIT
30.0000 mg | PACK | Freq: Once | INTRAMUSCULAR | Status: AC
  Administered 2021-04-05: 30 mg via INTRAMUSCULAR
  Filled 2021-04-05: qty 1

## 2021-04-05 NOTE — Patient Instructions (Signed)
Octreotide injection solution What is this medication? OCTREOTIDE (ok TREE oh tide) is used to reduce blood levels of growth hormone in patients with a condition called acromegaly. This medicine also reduces flushing and watery diarrhea caused by certain types of cancer. This medicine may be used for other purposes; ask your health care provider or pharmacist if you have questions. COMMON BRAND NAME(S): Bynfezia, Sandostatin What should I tell my care team before I take this medication? They need to know if you have any of these conditions: diabetes gallbladder disease kidney disease liver disease thyroid disease an unusual or allergic reaction to octreotide, other medicines, foods, dyes, or preservatives pregnant or trying to get pregnant breast-feeding How should I use this medication? This medicine is for injection under the skin or into a vein (only in emergency situations). It is usually given by a health care professional in a hospital or clinic setting. If you get this medicine at home, you will be taught how to prepare and give this medicine. Allow the injection solution to come to room temperature before use. Do not warm it artificially. Use exactly as directed. Take your medicine at regular intervals. Do not take your medicine more often than directed. It is important that you put your used needles and syringes in a special sharps container. Do not put them in a trash can. If you do not have a sharps container, call your pharmacist or healthcare provider to get one. Talk to your pediatrician regarding the use of this medicine in children. Special care may be needed. Overdosage: If you think you have taken too much of this medicine contact a poison control center or emergency room at once. NOTE: This medicine is only for you. Do not share this medicine with others. What if I miss a dose? If you miss a dose, take it as soon as you can. If it is almost time for your next dose, take only  that dose. Do not take double or extra doses. What may interact with this medication? bromocriptine certain medicines for blood pressure, heart disease, irregular heartbeat cyclosporine diuretics medicines for diabetes, including insulin quinidine This list may not describe all possible interactions. Give your health care provider a list of all the medicines, herbs, non-prescription drugs, or dietary supplements you use. Also tell them if you smoke, drink alcohol, or use illegal drugs. Some items may interact with your medicine. What should I watch for while using this medication? Visit your doctor or health care professional for regular checks on your progress. To help reduce irritation at the injection site, use a different site for each injection and make sure the solution is at room temperature before use. This medicine may cause decreases in blood sugar. Signs of low blood sugar include chills, cool, pale skin or cold sweats, drowsiness, extreme hunger, fast heartbeat, headache, nausea, nervousness or anxiety, shakiness, trembling, unsteadiness, tiredness, or weakness. Contact your doctor or health care professional right away if you experience any of these symptoms. This medicine may increase blood sugar. Ask your healthcare provider if changes in diet or medicines are needed if you have diabetes. This medicine may cause a decrease in vitamin B12. You should make sure that you get enough vitamin B12 while you are taking this medicine. Discuss the foods you eat and the vitamins you take with your health care professional. What side effects may I notice from receiving this medication? Side effects that you should report to your doctor or health care professional as soon as   possible: allergic reactions like skin rash, itching or hives, swelling of the face, lips, or tongue fast, slow, or irregular heartbeat right upper belly pain severe stomach pain signs and symptoms of high blood sugar such  as being more thirsty or hungry or having to urinate more than normal. You may also feel very tired or have blurry vision. signs and symptoms of low blood sugar such as feeling anxious; confusion; dizziness; increased hunger; unusually weak or tired; increased sweating; shakiness; cold, clammy skin; irritable; headache; blurred vision; fast heartbeat; loss of consciousness unusually weak or tired Side effects that usually do not require medical attention (report to your doctor or health care professional if they continue or are bothersome): diarrhea dizziness gas headache nausea, vomiting pain, redness, or irritation at site where injected upset stomach This list may not describe all possible side effects. Call your doctor for medical advice about side effects. You may report side effects to FDA at 1-800-FDA-1088. Where should I keep my medication? Keep out of the reach of children. Store in a refrigerator between 2 and 8 degrees C (36 and 46 degrees F). Protect from light. Allow to come to room temperature naturally. Do not use artificial heat. If protected from light, the injection may be stored at room temperature between 20 and 30 degrees C (70 and 86 degrees F) for 14 days. After the initial use, throw away any unused portion of a multiple dose vial after 14 days. Throw away unused portions of the ampules after use. NOTE: This sheet is a summary. It may not cover all possible information. If you have questions about this medicine, talk to your doctor, pharmacist, or health care provider.  2022 Elsevier/Gold Standard (2018-11-12 00:00:00)  

## 2021-05-03 ENCOUNTER — Inpatient Hospital Stay: Payer: 59 | Attending: Oncology

## 2021-05-03 ENCOUNTER — Other Ambulatory Visit: Payer: Self-pay

## 2021-05-03 VITALS — BP 157/95 | HR 63 | Temp 97.7°F | Wt 257.0 lb

## 2021-05-03 DIAGNOSIS — E340 Carcinoid syndrome: Secondary | ICD-10-CM | POA: Insufficient documentation

## 2021-05-03 DIAGNOSIS — C7A00 Malignant carcinoid tumor of unspecified site: Secondary | ICD-10-CM | POA: Diagnosis present

## 2021-05-03 DIAGNOSIS — C7B02 Secondary carcinoid tumors of liver: Secondary | ICD-10-CM | POA: Insufficient documentation

## 2021-05-03 DIAGNOSIS — C7B8 Other secondary neuroendocrine tumors: Secondary | ICD-10-CM

## 2021-05-03 MED ORDER — OCTREOTIDE ACETATE 30 MG IM KIT
30.0000 mg | PACK | Freq: Once | INTRAMUSCULAR | Status: AC
  Administered 2021-05-03: 30 mg via INTRAMUSCULAR
  Filled 2021-05-03: qty 1

## 2021-05-03 NOTE — Patient Instructions (Signed)
Octreotide acetate injection suspension What is this medication? OCTREOTIDE (ok TREE oh tide) is used to reduce blood levels of growth hormone in patients with a condition called acromegaly. This medicine also reduces flushing and watery diarrhea caused by certain types of cancer. This medicine may be used for other purposes; ask your health care provider or pharmacist if you have questions. COMMON BRAND NAME(S): Sandostatin LAR What should I tell my care team before I take this medication? They need to know if you have any of these conditions: diabetes gallbladder disease kidney disease liver disease thyroid disease an unusual or allergic reaction to octreotide, other medicines, foods, dyes, or preservatives pregnant or trying to get pregnant breast-feeding How should I use this medication? This medicine is for injection into a muscle. It is usually given by a health care professional in a hospital or clinic setting. Talk to your pediatrician regarding the use of this medicine in children. Special care may be needed. Overdosage: If you think you have taken too much of this medicine contact a poison control center or emergency room at once. NOTE: This medicine is only for you. Do not share this medicine with others. What if I miss a dose? Keep appointments for follow-up doses. It is important not to miss your dose. Call your doctor or health care professional if you are unable to keep an appointment. What may interact with this medication? Do not take this medicine with any of the following medications: cisapride dronedarone flibanserin lutetium Lu 177 dotatate pimozide saquinavir thioridazine This medicine may also interact with the following medications: bromocriptine certain medicines for blood pressure, heart disease, irregular heartbeat cyclosporine diuretics medicines for diabetes, including insulin quinidine This list may not describe all possible interactions. Give your  health care provider a list of all the medicines, herbs, non-prescription drugs, or dietary supplements you use. Also tell them if you smoke, drink alcohol, or use illegal drugs. Some items may interact with your medicine. What should I watch for while using this medication? Visit your health care professional for regular checks on your progress. Tell your health care professional if your symptoms do not start to get better or if they get worse. This medicine may cause decreases in blood sugar. Signs of low blood sugar include chills, cool, pale skin or cold sweats, drowsiness, extreme hunger, fast heartbeat, headache, nausea, nervousness or anxiety, shakiness, trembling, unsteadiness, tiredness, or weakness. Contact your doctor or health care professional right away if you experience any of these symptoms. This medicine may increase blood sugar. Ask your healthcare provider if changes in diet or medicines are needed if you have diabetes. This medicine may cause a decrease in vitamin B12. You should make sure that you get enough vitamin B12 while you are taking this medicine. Discuss the foods you eat and the vitamins you take with your health care professional. What side effects may I notice from receiving this medication? Side effects that you should report to your doctor or health care professional as soon as possible: allergic reactions like skin rash, itching or hives, swelling of the face, lips, or tongue fast, slow, or irregular heartbeat right upper belly pain severe stomach pain signs and symptoms of high blood sugar such as being more thirsty or hungry or having to urinate more than normal. You may also feel very tired or have blurry vision. signs and symptoms of low blood sugar such as feeling anxious; confusion; dizziness; increased hunger; unusually weak or tired; increased sweating; shakiness; cold, clammy skin;   irritable; headache; blurred vision; fast heartbeat; loss of  consciousness unusually weak or tired Side effects that usually do not require medical attention (report these to your doctor or health care professional if they continue or are bothersome): diarrhea dizziness gas headache nausea, vomiting pain, redness, or irritation at site where injected upset stomach This list may not describe all possible side effects. Call your doctor for medical advice about side effects. You may report side effects to FDA at 1-800-FDA-1088. Where should I keep my medication? This medicine is given in a hospital or clinic and will not be stored at home. NOTE: This sheet is a summary. It may not cover all possible information. If you have questions about this medicine, talk to your doctor, pharmacist, or health care provider.  2022 Elsevier/Gold Standard (2019-08-03 00:00:00)  

## 2021-05-03 NOTE — Progress Notes (Signed)
Applied for co-pay assistance through [employer] co-pay card for [employer].

## 2021-05-03 NOTE — Progress Notes (Signed)
Patient tolerated octreotide injection today, no concerns voiced. Patient discharged, stable.

## 2021-05-22 NOTE — Progress Notes (Signed)
[facility]  [address] [place],  [place]  [address] [phone]  Clinic Day:  05/28/2021  Referring physician: Jeanie Sewer, NP  This document serves as a record of services personally performed by Hosie Poisson, MD. It was created on their behalf by Curry,Lauren E, a trained medical scribe. The creation of this record is based on the scribe's personal observations and the provider's statements to them.  ASSESSMENT & PLAN:   1. 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  He does have a terminal ileum mass measuring 4.0 x 2.8 x 3.7 cm, and a mass of the mesentery of the small intestine measuring 3.9 x 2.3 x 4.0 cm.  As this has already metastasized, surgical resection is not indicated.  24 hour urine for 5 HIAA was quite elevated at 86.8, confirming carcinoid syndrome.  Chromogranin A was also elevated at 214. He started treatment with octreotide injections in October 2021 and continues to tolerate this without difficulty.  Due to the persistent diarrhea, we increased the octreotide to 30 mg every 4 weeks. CT imaging in August 2022 was stable. Chromogranin A from November was down to 118.6.    2. Multiple liver masses, the largest measuring 4.0 x 4.3 cm, consistent with metastatic carcinoid with carcinoid syndrome, which were stable on CT imaging.     3. History of testicular cancer diagnosed in 1996, treated with orchiectomy and chemotherapy.    4. Diarrhea, secondary to carcinoid malignancy.  This has improved with increasing octreotide to 30 mg.   5.  Chronic kidney disease, stable.  He denies being on any other medications and has not been taking NSAIDs.  I advised that he avoid ibuprofen and Aleeve. He knows to push fluids.   6.  Renal lithiasis.  He still has one stone in the left kidney and one at the left UPJ.      He will continue octreotide 30 mg monthly and will proceed with his next dose on  Thursday.  We will plan to see him back in 12 weeks with a CBC, comprehensive metabolic panel, chromogranin A and CT abdomen and pelvis for repeat evaluation. The patient understands the plans discussed today and is in agreement with them.  He knows to contact our office if he develops concerns prior to his next appointment.  I provided 15 minutes of face-to-face time during this this encounter and > 50% was spent counseling as documented under my assessment and plan.    Derwood Kaplan, MD [facility] [address] [place] [address] Dept: [phone] Dept Fax: [phone]    CHIEF COMPLAINT:  CC: Metastatic carcinoid to liver  Current Treatment:  Octreotide 30 mg IM monthly   HISTORY OF PRESENT ILLNESS:  Glenn Bender is a 61 y.o. male with metastatic carcinoid to liver diagnosed in September 2021.  The patient presented in September with hematuria, felt to be due to a kidney stone.  CT abdomen and pelvis revealed multiple liver masses, the largest measuring 4.0 x 4.3 cm, a terminal ileum mass measuring 4.0 x 2.8 x 3.7 cm, and a mass of the mesentery of the small intestine measuring 3.9 x 2.3 x 4.0 cm.  Diagnostic colonoscopy with Dr. Coralie Keens in September revealed a large mucosal covered mass protruding through the ileocecal valve with peristalsis.  Biopsy was collected and surgical pathology was benign.  Liver biopsy revealed metastatic neuroendocrine tumor, grade 2, consistent  with gastrointestinal primary. Synaptophysin and chromogranin-A immunostains were positive as well as CDX-2 supporting gastrointestinal origin.  Ki67 was 6%.  He reports flushing of his face with alcohol and certain foods.  He reported diarrhea, as well as flushing.  24 hour urine for 5HIAA was also elevated at 86.8, which is consistent with carcinoid.  Chromogranin A was elevated at 214 in October and came down to 116.9 in December.  CT chest from  November 2021 revealed left paratracheal adenopathy and pleural nodularity along the right hemidiaphragm measuring up to 12 mm, worrisome for metastatic disease. As he had metastatic disease, resection of his primary was not recommended. The patient was started on octreotide 20 mg IM monthly in October.  Octreotide was subsequently increased to 30 mg monthly in December due to persistent diarrhea.   CT chest, abdomen and pelvis from February 2022 revealed mostly stable to slightly decreased appearance of the carcinoid tumor, including the mesenteric lesion with cicatricial reaction, adjacent wall.  There was increased nodularity in a bandlike fashion along the mesenteric vessels adjacent to the carcinoid tumor. The liver lesions were stable with no new lesions identified. There was stable nodularity/lobularity along the right hemidiaphragm.  He has had a continued decrease in the Chromogranin A, which was 111.4 in February, and then 100.3 in March, which is within normal limits.  He had improvement in his diarrhea with the increase in octreotide. The chromogranin A was up slightly in May to 126. CT chest, abdomen and pelvis from August revealed no new or progressive metastatic disease.  Foci of masslike wall thickening within a central right abdominal small bowel loop and at the terminal ileum are stable. Calcified central mesenteric adenopathy is stable. Findings are suggestive of multifocal small bowel carcinoid with conglomerate mesenteric nodal metastases.  Widespread hyperenhancing liver metastases are stable.  Stable clustered subpleural solid pulmonary nodules along the right hemidiaphragm in the basilar right lower lobe are compatible with pulmonary metastases.  There is new background diffuse hepatic steatosis. Chromogranin A from August was down to 118.6.  INTERVAL HISTORY:  I have reviewed his chart and materials related to his cancer extensively and collaborated history with the patient. Summary of  oncologic history is as follows: Oncology History  Malignant carcinoid tumor of unknown primary site [facility])  02/11/2020 Initial Diagnosis   Malignant carcinoid tumor of unknown primary site [facility])     Glenn Bender is here for routine follow up prior to his next octreotide. He is hypertensive today, and does admit to eating more sodium rich foods. I advised that he cut back on this for now and to follow up with Jeanie Sewer, NP. Otherwise, he has been well and denies complaints. Blood counts and chemistries are unremarkable except for a creatinine of 1.7, stable, and a protein of 6.2. Chromogranin A from November was 118.6, down from 126.0. I advised that he increase his protein intake. His  appetite is good, and he has gained 1 pound since his last visit.  He denies fever, chills or other signs of infection.  He denies nausea, vomiting, bowel issues, or abdominal pain.  He denies sore throat, cough, dyspnea, or chest pain.  HISTORY:   Allergies: No Known Allergies  Current Medications: Current Outpatient Medications  Medication Sig Dispense Refill   octreotide (SANDOSTATIN LAR) 20 MG injection Inject 20 mg into the muscle every 28 (twenty-eight) days.     No current facility-administered medications for this visit.    REVIEW OF SYSTEMS:  Review of Systems  Constitutional: Negative.  Negative for appetite change, chills, fatigue, fever and unexpected weight change.  HENT:  Negative.    Eyes: Negative.   Respiratory: Negative.  Negative for chest tightness, cough, hemoptysis, shortness of breath and wheezing.   Cardiovascular: Negative.  Negative for chest pain, leg swelling and palpitations.  Gastrointestinal: Negative.  Negative for abdominal distention, abdominal pain, blood in stool, constipation, diarrhea, nausea and vomiting.  Endocrine: Negative.   Genitourinary: Negative.  Negative for difficulty urinating, dysuria, frequency and hematuria.   Musculoskeletal: Negative.  Negative for  arthralgias, back pain, flank pain, gait problem and myalgias.  Skin: Negative.   Neurological: Negative.  Negative for dizziness, extremity weakness, gait problem, headaches, light-headedness, numbness, seizures and speech difficulty.  Hematological: Negative.   Psychiatric/Behavioral: Negative.  Negative for depression and sleep disturbance. The patient is not nervous/anxious.   All other systems reviewed and are negative.    VITALS:  Blood pressure (!) 169/92, pulse 75, temperature 97.8 F (36.6 C), temperature source Oral, resp. rate 16, height $RemoveBe'5\' 10"'QCDwEHygs$  (1.778 m), weight 254 lb 14.4 oz (115.6 kg), SpO2 98 %.  Wt Readings from Last 3 Encounters:  05/28/21 254 lb 14.4 oz (115.6 kg)  05/03/21 257 lb (116.6 kg)  04/05/21 257 lb (116.6 kg)    Body mass index is 36.57 kg/m.  Performance status (ECOG): 0 - Asymptomatic  PHYSICAL EXAM:  Physical Exam Constitutional:      General: He is not in acute distress.    Appearance: Normal appearance. He is normal weight.  HENT:     Head: Normocephalic and atraumatic.  Eyes:     General: No scleral icterus.    Extraocular Movements: Extraocular movements intact.     Conjunctiva/sclera: Conjunctivae normal.     Pupils: Pupils are equal, round, and reactive to light.  Cardiovascular:     Rate and Rhythm: Normal rate and regular rhythm.     Pulses: Normal pulses.     Heart sounds: Normal heart sounds. No murmur heard.   No friction rub. No gallop.  Pulmonary:     Effort: Pulmonary effort is normal. No respiratory distress.     Breath sounds: Normal breath sounds.  Abdominal:     General: Bowel sounds are normal. There is no distension.     Palpations: Abdomen is soft. There is hepatomegaly (7-8 cm below the right costal margin). There is no splenomegaly or mass.     Tenderness: There is no abdominal tenderness.  Musculoskeletal:        General: Normal range of motion.     Cervical back: Normal range of motion and neck supple.     Right  lower leg: No edema.     Left lower leg: No edema.  Lymphadenopathy:     Cervical: No cervical adenopathy.  Skin:    General: Skin is warm and dry.  Neurological:     General: No focal deficit present.     Mental Status: He is alert and oriented to person, place, and time. Mental status is at baseline.  Psychiatric:        Mood and Affect: Mood normal.        Behavior: Behavior normal.        Thought Content: Thought content normal.        Judgment: Judgment normal.     LABS:   CBC Latest Ref Rng & Units 05/28/2021 03/07/2021 12/11/2020  WBC - 6.4 7.5 6.3  Hemoglobin 13.5 - 17.5 14.7 14.1 13.9  Hematocrit 41 -  53 44 41 41  Platelets 150 - 399 153 136(A) 127(A)   CMP Latest Ref Rng & Units 05/28/2021 03/07/2021 12/11/2020  Glucose 70 - 99 mg/dL - - -  BUN 4 - $R'21 17 19 'zR$ 22(A)  Creatinine 0.6 - 1.3 1.7(A) 1.7(A) 1.7(A)  Sodium 137 - 147 139 142 137  Potassium 3.4 - 5.3 4.0 4.0 4.2  Chloride 99 - 108 108 110(A) 105  CO2 13 - 22 25(A) 23(A) 23(A)  Calcium 8.7 - 10.7 8.4(A) 8.5(A) 8.6(A)  Total Protein 6.5 - 8.1 g/dL - - -  Total Bilirubin 0.3 - 1.2 mg/dL - - -  Alkaline Phos 25 - 125 49 54 52  AST 14 - 40 39 37 38  ALT 10 - 40 59(A) 47(A) 50(A)   STUDIES:  No results found.   I, Rita Ohara, am acting as scribe for Derwood Kaplan, MD  I have reviewed this report as typed by the medical scribe, and it is complete and accurate.

## 2021-05-28 ENCOUNTER — Other Ambulatory Visit: Payer: Self-pay | Admitting: Oncology

## 2021-05-28 ENCOUNTER — Other Ambulatory Visit: Payer: Self-pay

## 2021-05-28 ENCOUNTER — Encounter: Payer: Self-pay | Admitting: Oncology

## 2021-05-28 ENCOUNTER — Other Ambulatory Visit: Payer: Self-pay | Admitting: Hematology and Oncology

## 2021-05-28 ENCOUNTER — Inpatient Hospital Stay (INDEPENDENT_AMBULATORY_CARE_PROVIDER_SITE_OTHER): Payer: 59 | Admitting: Oncology

## 2021-05-28 ENCOUNTER — Inpatient Hospital Stay: Payer: 59

## 2021-05-28 VITALS — BP 169/92 | HR 75 | Temp 97.8°F | Resp 16 | Ht 70.0 in | Wt 254.9 lb

## 2021-05-28 DIAGNOSIS — C7B8 Other secondary neuroendocrine tumors: Secondary | ICD-10-CM | POA: Diagnosis not present

## 2021-05-28 DIAGNOSIS — C7A00 Malignant carcinoid tumor of unspecified site: Secondary | ICD-10-CM

## 2021-05-28 LAB — HEPATIC FUNCTION PANEL
ALT: 59 — AB (ref 10–40)
AST: 39 (ref 14–40)
Alkaline Phosphatase: 49 (ref 25–125)
Bilirubin, Total: 0.6

## 2021-05-28 LAB — BASIC METABOLIC PANEL
BUN: 17 (ref 4–21)
CO2: 25 — AB (ref 13–22)
Chloride: 108 (ref 99–108)
Creatinine: 1.7 — AB (ref 0.6–1.3)
Glucose: 104
Potassium: 4 (ref 3.4–5.3)
Sodium: 139 (ref 137–147)

## 2021-05-28 LAB — COMPREHENSIVE METABOLIC PANEL
Albumin: 3.9 (ref 3.5–5.0)
Calcium: 8.4 — AB (ref 8.7–10.7)

## 2021-05-28 LAB — CBC AND DIFFERENTIAL
HCT: 44 (ref 41–53)
Hemoglobin: 14.7 (ref 13.5–17.5)
Neutrophils Absolute: 4.54
Platelets: 153 (ref 150–399)
WBC: 6.4

## 2021-05-28 LAB — CBC: RBC: 5 (ref 3.87–5.11)

## 2021-05-31 ENCOUNTER — Other Ambulatory Visit: Payer: Self-pay

## 2021-05-31 ENCOUNTER — Inpatient Hospital Stay: Payer: 59 | Attending: Oncology

## 2021-05-31 VITALS — BP 156/90 | HR 67 | Temp 98.0°F | Resp 18 | Ht 70.0 in | Wt 254.0 lb

## 2021-05-31 DIAGNOSIS — E340 Carcinoid syndrome: Secondary | ICD-10-CM | POA: Insufficient documentation

## 2021-05-31 DIAGNOSIS — C7A00 Malignant carcinoid tumor of unspecified site: Secondary | ICD-10-CM | POA: Diagnosis present

## 2021-05-31 DIAGNOSIS — C7B02 Secondary carcinoid tumors of liver: Secondary | ICD-10-CM | POA: Diagnosis present

## 2021-05-31 DIAGNOSIS — C7B8 Other secondary neuroendocrine tumors: Secondary | ICD-10-CM

## 2021-05-31 DIAGNOSIS — Z79899 Other long term (current) drug therapy: Secondary | ICD-10-CM | POA: Diagnosis not present

## 2021-05-31 MED ORDER — OCTREOTIDE ACETATE 30 MG IM KIT
30.0000 mg | PACK | Freq: Once | INTRAMUSCULAR | Status: AC
  Administered 2021-05-31: 30 mg via INTRAMUSCULAR

## 2021-05-31 NOTE — Patient Instructions (Signed)
Octreotide injection solution What is this medication? OCTREOTIDE (ok TREE oh tide) is used to reduce blood levels of growth hormone in patients with a condition called acromegaly. This medicine also reduces flushing and watery diarrhea caused by certain types of cancer. This medicine may be used for other purposes; ask your health care provider or pharmacist if you have questions. COMMON BRAND NAME(S): Bynfezia, Sandostatin What should I tell my care team before I take this medication? They need to know if you have any of these conditions: diabetes gallbladder disease kidney disease liver disease thyroid disease an unusual or allergic reaction to octreotide, other medicines, foods, dyes, or preservatives pregnant or trying to get pregnant breast-feeding How should I use this medication? This medicine is for injection under the skin or into a vein (only in emergency situations). It is usually given by a health care professional in a hospital or clinic setting. If you get this medicine at home, you will be taught how to prepare and give this medicine. Allow the injection solution to come to room temperature before use. Do not warm it artificially. Use exactly as directed. Take your medicine at regular intervals. Do not take your medicine more often than directed. It is important that you put your used needles and syringes in a special sharps container. Do not put them in a trash can. If you do not have a sharps container, call your pharmacist or healthcare provider to get one. Talk to your pediatrician regarding the use of this medicine in children. Special care may be needed. Overdosage: If you think you have taken too much of this medicine contact a poison control center or emergency room at once. NOTE: This medicine is only for you. Do not share this medicine with others. What if I miss a dose? If you miss a dose, take it as soon as you can. If it is almost time for your next dose, take only  that dose. Do not take double or extra doses. What may interact with this medication? bromocriptine certain medicines for blood pressure, heart disease, irregular heartbeat cyclosporine diuretics medicines for diabetes, including insulin quinidine This list may not describe all possible interactions. Give your health care provider a list of all the medicines, herbs, non-prescription drugs, or dietary supplements you use. Also tell them if you smoke, drink alcohol, or use illegal drugs. Some items may interact with your medicine. What should I watch for while using this medication? Visit your doctor or health care professional for regular checks on your progress. To help reduce irritation at the injection site, use a different site for each injection and make sure the solution is at room temperature before use. This medicine may cause decreases in blood sugar. Signs of low blood sugar include chills, cool, pale skin or cold sweats, drowsiness, extreme hunger, fast heartbeat, headache, nausea, nervousness or anxiety, shakiness, trembling, unsteadiness, tiredness, or weakness. Contact your doctor or health care professional right away if you experience any of these symptoms. This medicine may increase blood sugar. Ask your healthcare provider if changes in diet or medicines are needed if you have diabetes. This medicine may cause a decrease in vitamin B12. You should make sure that you get enough vitamin B12 while you are taking this medicine. Discuss the foods you eat and the vitamins you take with your health care professional. What side effects may I notice from receiving this medication? Side effects that you should report to your doctor or health care professional as soon as   possible: allergic reactions like skin rash, itching or hives, swelling of the face, lips, or tongue fast, slow, or irregular heartbeat right upper belly pain severe stomach pain signs and symptoms of high blood sugar such  as being more thirsty or hungry or having to urinate more than normal. You may also feel very tired or have blurry vision. signs and symptoms of low blood sugar such as feeling anxious; confusion; dizziness; increased hunger; unusually weak or tired; increased sweating; shakiness; cold, clammy skin; irritable; headache; blurred vision; fast heartbeat; loss of consciousness unusually weak or tired Side effects that usually do not require medical attention (report to your doctor or health care professional if they continue or are bothersome): diarrhea dizziness gas headache nausea, vomiting pain, redness, or irritation at site where injected upset stomach This list may not describe all possible side effects. Call your doctor for medical advice about side effects. You may report side effects to FDA at 1-800-FDA-1088. Where should I keep my medication? Keep out of the reach of children. Store in a refrigerator between 2 and 8 degrees C (36 and 46 degrees F). Protect from light. Allow to come to room temperature naturally. Do not use artificial heat. If protected from light, the injection may be stored at room temperature between 20 and 30 degrees C (70 and 86 degrees F) for 14 days. After the initial use, throw away any unused portion of a multiple dose vial after 14 days. Throw away unused portions of the ampules after use. NOTE: This sheet is a summary. It may not cover all possible information. If you have questions about this medicine, talk to your doctor, pharmacist, or health care provider.  2022 Elsevier/Gold Standard (2018-11-12 00:00:00)  

## 2021-06-07 ENCOUNTER — Encounter: Payer: Self-pay | Admitting: Oncology

## 2021-06-12 IMAGING — CT CT ABD-PELV W/O CM
2 of 4 series · 15 of 46 positions shown, 17 images · non-contrast
Comparison: CT chest, abdomen and pelvis 05/30/2020.

CLINICAL DATA: Patient status post left percutaneous
nephrolithotomy and double-J ureteral stent placement for a left
renal calculus 06/13/2020. The patient has a history of metastatic
carcinoid tumor.

EXAM:
CT ABDOMEN AND PELVIS WITHOUT CONTRAST
TECHNIQUE: Multidetector CT imaging of the abdomen and pelvis was performed
following the standard protocol without IV contrast.

[Series 2: axial st · axial · 0.85mm/px · z∈[-371,+109]mm · 12 of 108 slices shown, 14 images]
[im 6/108  soft-tissue]
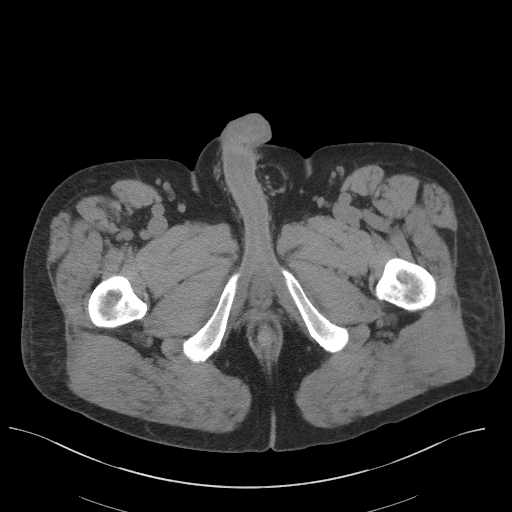
[im 6/108  bone]
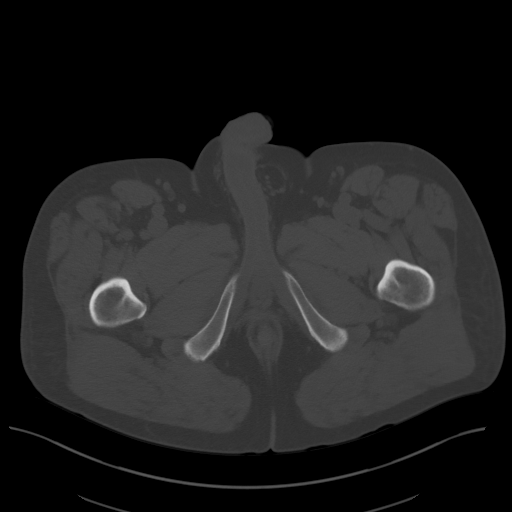
[im 17/108  soft-tissue]
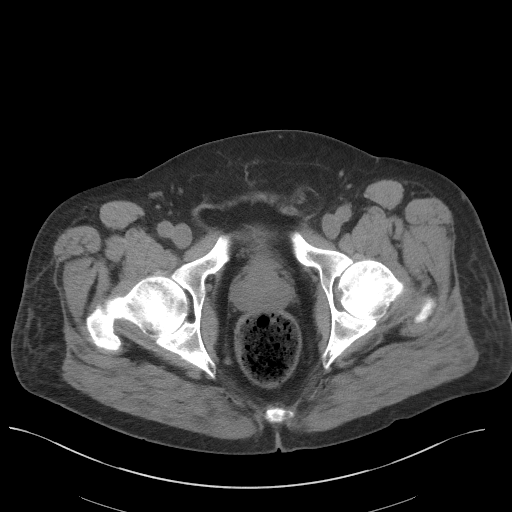
[im 23/108  soft-tissue]
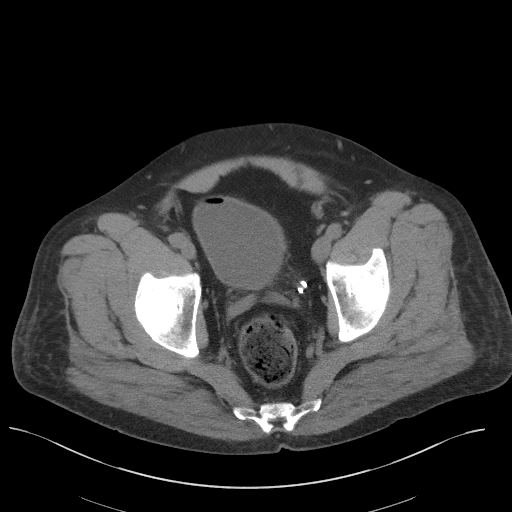
[im 34/108  soft-tissue]
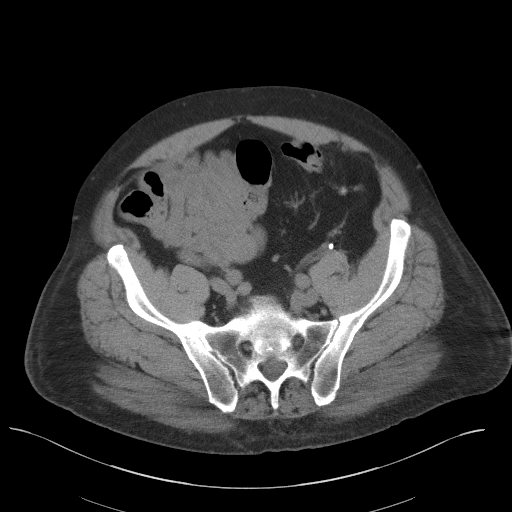
[im 40/108  soft-tissue]
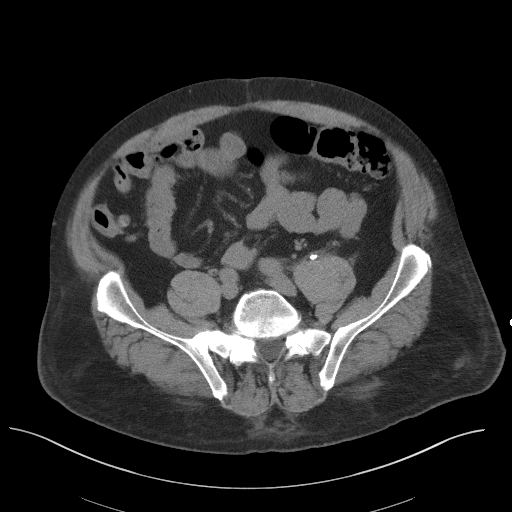
[im 51/108  soft-tissue]
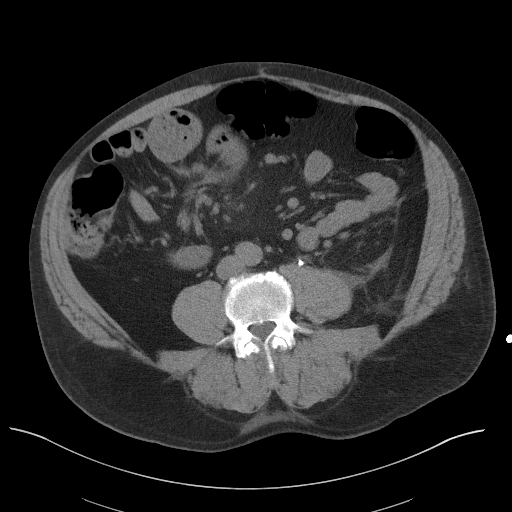
[im 57/108  soft-tissue]
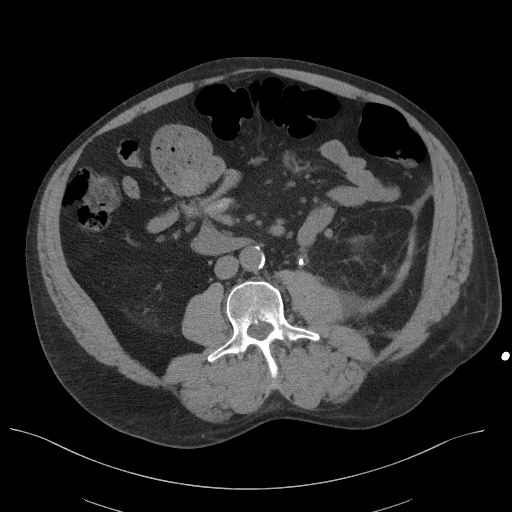
[im 68/108  soft-tissue]
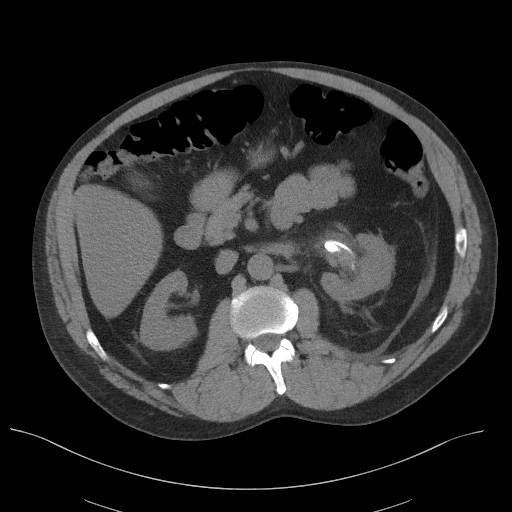
[im 74/108  soft-tissue]
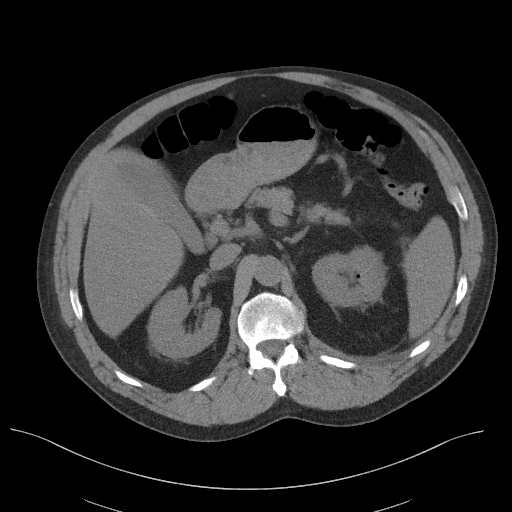
[im 74/108  bone]
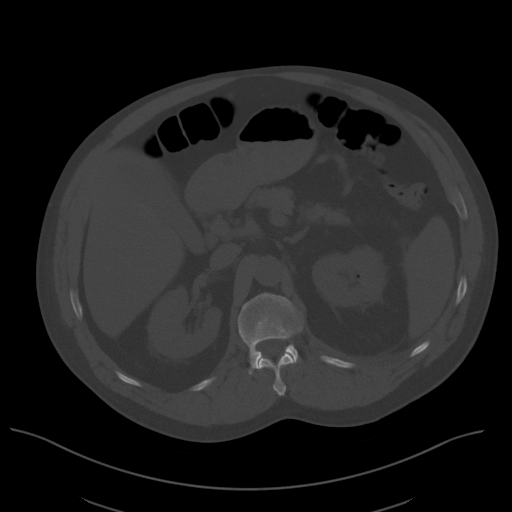
[im 85/108  soft-tissue]
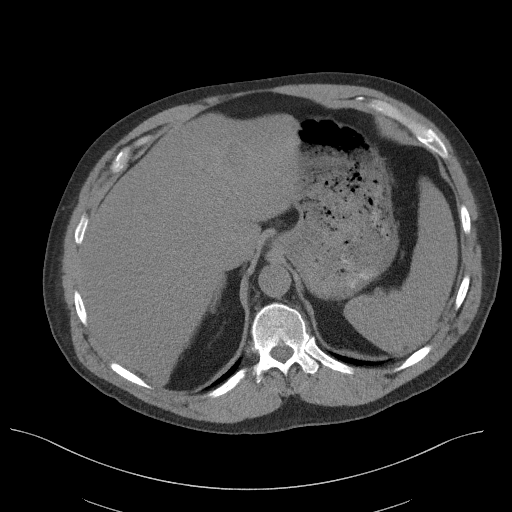
[im 91/108  soft-tissue]
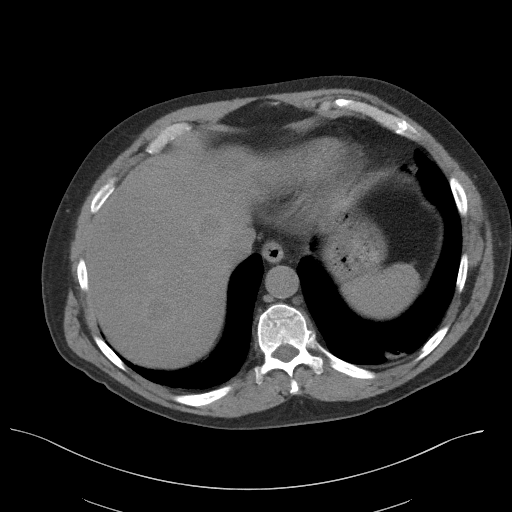
[im 102/108  soft-tissue]
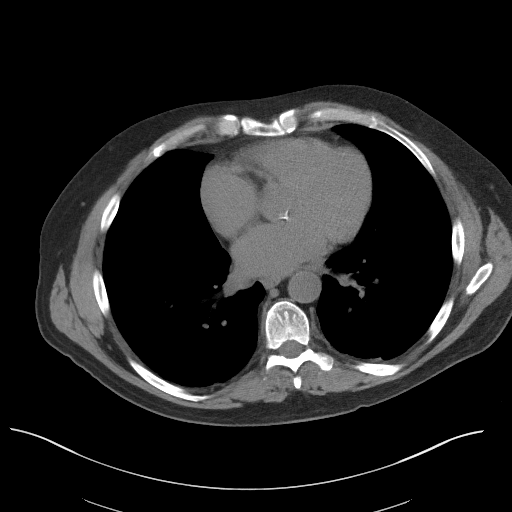

[Series 4: coronal st · coronal · 0.91mm/px · 3 of 116 slices shown]
[im 39/116  soft-tissue]
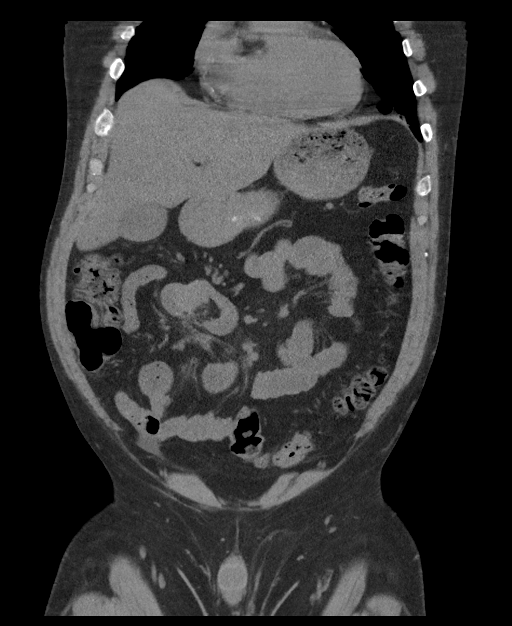
[im 52/116  soft-tissue]
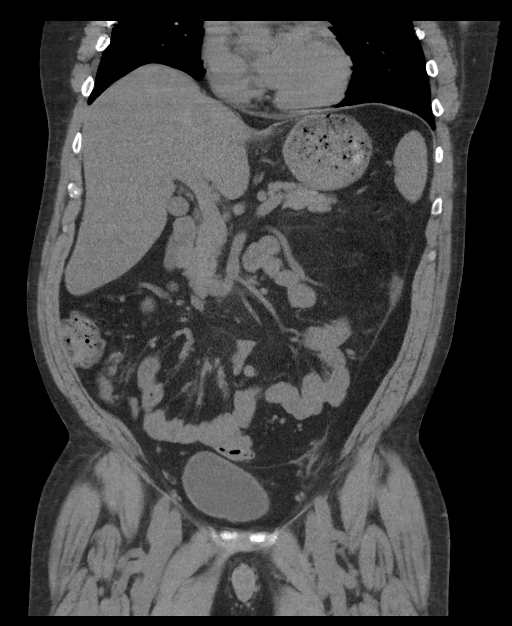
[im 64/116  soft-tissue]
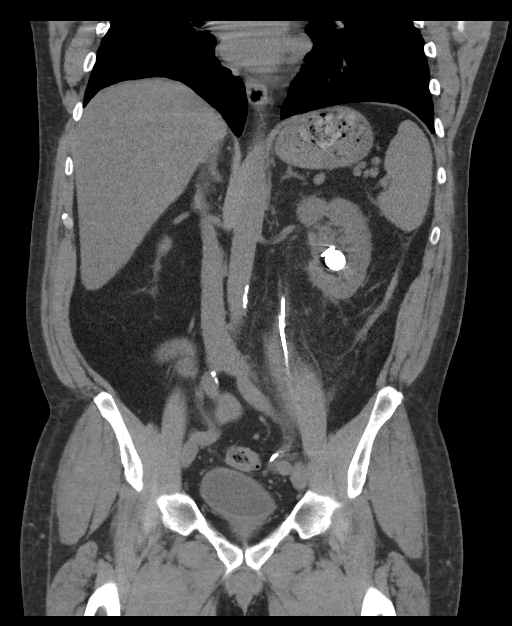

[15 of 46 positions shown; findings below may reference images not displayed]

FINDINGS: Lower chest: Dependent atelectasis in the lung bases. No pleural or
pericardial effusion.

Hepatobiliary: Multiple mass lesions in the liver are unchanged.
Fatty infiltration of the liver also noted. Gallbladder and biliary
tree are negative.

Pancreas: Unremarkable. No pancreatic ductal dilatation or
surrounding inflammatory changes.

Spleen: Normal in size without focal abnormality.

Adrenals/Urinary Tract: The adrenal glands appear normal. New left
nephrostomy tube and double-J ureteral stent are in place. The
distal loop of the patient's ureteral stent is within the ureter,
proximal to the urinary bladder. There is mild fullness of the left
intrarenal collecting system. 2-3 punctate stone fragments are seen
in the left intrarenal collecting system. There is some air in the
urinary bladder consistent with instrumentation. The right kidney
and ureter appear normal.

Stomach/Bowel: Stomach is within normal limits. Appendix appears
normal. No evidence of bowel wall thickening, distention, or
inflammatory changes.

Vascular/Lymphatic: Aortic atherosclerosis. Small perirectal lymph
nodes are unchanged.

Reproductive: Prostate is unremarkable.

Other: Calcified mesenteric mass on the right consistent with the
patient's known carcinoid tumor is unchanged in appearance. Fat
containing left inguinal hernia noted.

Musculoskeletal: No acute or focal abnormality. Small density in
subcutaneous fat in the left buttock is unchanged may be related to
prior injection.
IMPRESSION: Status post left nephrolithotomy with a new nephrostomy tube and
double-J ureteral stent in place. The distal loop of the stent is in
the ureter proximal to the urinary bladder.

2-3 tiny stone fragments are seen in the left kidney. Mild fullness
of the left intrarenal collecting system noted. No stones are seen
in the left ureter.

Carcinoid tumor with metastases to the liver, unchanged.

Fatty infiltration of the liver.

## 2021-06-22 ENCOUNTER — Telehealth: Payer: Self-pay

## 2021-06-22 NOTE — Telephone Encounter (Signed)
I spoke with pt's wife and notified her that pt should see his PCP for his blood pressure. She stated that's what she thought. She appreciated the call back.

## 2021-06-27 ENCOUNTER — Other Ambulatory Visit: Payer: Self-pay | Admitting: Pharmacist

## 2021-06-28 ENCOUNTER — Inpatient Hospital Stay: Payer: 59 | Attending: Oncology

## 2021-06-28 ENCOUNTER — Other Ambulatory Visit: Payer: Self-pay

## 2021-06-28 VITALS — BP 146/77 | HR 58 | Resp 20 | Wt 251.0 lb

## 2021-06-28 DIAGNOSIS — Z79899 Other long term (current) drug therapy: Secondary | ICD-10-CM | POA: Insufficient documentation

## 2021-06-28 DIAGNOSIS — C7A00 Malignant carcinoid tumor of unspecified site: Secondary | ICD-10-CM | POA: Insufficient documentation

## 2021-06-28 DIAGNOSIS — C7B8 Other secondary neuroendocrine tumors: Secondary | ICD-10-CM

## 2021-06-28 DIAGNOSIS — E340 Carcinoid syndrome: Secondary | ICD-10-CM | POA: Insufficient documentation

## 2021-06-28 DIAGNOSIS — C7B02 Secondary carcinoid tumors of liver: Secondary | ICD-10-CM | POA: Diagnosis present

## 2021-06-28 MED ORDER — OCTREOTIDE ACETATE 30 MG IM KIT
30.0000 mg | PACK | Freq: Once | INTRAMUSCULAR | Status: AC
  Administered 2021-06-28: 30 mg via INTRAMUSCULAR
  Filled 2021-06-28: qty 1

## 2021-06-28 NOTE — Patient Instructions (Signed)
Octreotide injection solution What is this medication? OCTREOTIDE (ok TREE oh tide) is used to reduce blood levels of growth hormone in patients with a condition called acromegaly. This medicine also reduces flushing and watery diarrhea caused by certain types of cancer. This medicine may be used for other purposes; ask your health care provider or pharmacist if you have questions. COMMON BRAND NAME(S): Bynfezia, Sandostatin What should I tell my care team before I take this medication? They need to know if you have any of these conditions: diabetes gallbladder disease kidney disease liver disease thyroid disease an unusual or allergic reaction to octreotide, other medicines, foods, dyes, or preservatives pregnant or trying to get pregnant breast-feeding How should I use this medication? This medicine is for injection under the skin or into a vein (only in emergency situations). It is usually given by a health care professional in a hospital or clinic setting. If you get this medicine at home, you will be taught how to prepare and give this medicine. Allow the injection solution to come to room temperature before use. Do not warm it artificially. Use exactly as directed. Take your medicine at regular intervals. Do not take your medicine more often than directed. It is important that you put your used needles and syringes in a special sharps container. Do not put them in a trash can. If you do not have a sharps container, call your pharmacist or healthcare provider to get one. Talk to your pediatrician regarding the use of this medicine in children. Special care may be needed. Overdosage: If you think you have taken too much of this medicine contact a poison control center or emergency room at once. NOTE: This medicine is only for you. Do not share this medicine with others. What if I miss a dose? If you miss a dose, take it as soon as you can. If it is almost time for your next dose, take only  that dose. Do not take double or extra doses. What may interact with this medication? bromocriptine certain medicines for blood pressure, heart disease, irregular heartbeat cyclosporine diuretics medicines for diabetes, including insulin quinidine This list may not describe all possible interactions. Give your health care provider a list of all the medicines, herbs, non-prescription drugs, or dietary supplements you use. Also tell them if you smoke, drink alcohol, or use illegal drugs. Some items may interact with your medicine. What should I watch for while using this medication? Visit your doctor or health care professional for regular checks on your progress. To help reduce irritation at the injection site, use a different site for each injection and make sure the solution is at room temperature before use. This medicine may cause decreases in blood sugar. Signs of low blood sugar include chills, cool, pale skin or cold sweats, drowsiness, extreme hunger, fast heartbeat, headache, nausea, nervousness or anxiety, shakiness, trembling, unsteadiness, tiredness, or weakness. Contact your doctor or health care professional right away if you experience any of these symptoms. This medicine may increase blood sugar. Ask your healthcare provider if changes in diet or medicines are needed if you have diabetes. This medicine may cause a decrease in vitamin B12. You should make sure that you get enough vitamin B12 while you are taking this medicine. Discuss the foods you eat and the vitamins you take with your health care professional. What side effects may I notice from receiving this medication? Side effects that you should report to your doctor or health care professional as soon as   possible: allergic reactions like skin rash, itching or hives, swelling of the face, lips, or tongue fast, slow, or irregular heartbeat right upper belly pain severe stomach pain signs and symptoms of high blood sugar such  as being more thirsty or hungry or having to urinate more than normal. You may also feel very tired or have blurry vision. signs and symptoms of low blood sugar such as feeling anxious; confusion; dizziness; increased hunger; unusually weak or tired; increased sweating; shakiness; cold, clammy skin; irritable; headache; blurred vision; fast heartbeat; loss of consciousness unusually weak or tired Side effects that usually do not require medical attention (report to your doctor or health care professional if they continue or are bothersome): diarrhea dizziness gas headache nausea, vomiting pain, redness, or irritation at site where injected upset stomach This list may not describe all possible side effects. Call your doctor for medical advice about side effects. You may report side effects to FDA at 1-800-FDA-1088. Where should I keep my medication? Keep out of the reach of children. Store in a refrigerator between 2 and 8 degrees C (36 and 46 degrees F). Protect from light. Allow to come to room temperature naturally. Do not use artificial heat. If protected from light, the injection may be stored at room temperature between 20 and 30 degrees C (70 and 86 degrees F) for 14 days. After the initial use, throw away any unused portion of a multiple dose vial after 14 days. Throw away unused portions of the ampules after use. NOTE: This sheet is a summary. It may not cover all possible information. If you have questions about this medicine, talk to your doctor, pharmacist, or health care provider.  2022 Elsevier/Gold Standard (2018-11-12 00:00:00)  

## 2021-07-20 ENCOUNTER — Ambulatory Visit: Payer: Self-pay | Admitting: Oncology

## 2021-07-20 ENCOUNTER — Other Ambulatory Visit: Payer: 59

## 2021-07-26 ENCOUNTER — Inpatient Hospital Stay: Payer: 59

## 2021-07-26 VITALS — BP 123/78 | HR 87 | Temp 98.0°F | Resp 20 | Ht 70.0 in | Wt 250.0 lb

## 2021-07-26 DIAGNOSIS — C7A00 Malignant carcinoid tumor of unspecified site: Secondary | ICD-10-CM

## 2021-07-26 DIAGNOSIS — C7B8 Other secondary neuroendocrine tumors: Secondary | ICD-10-CM

## 2021-07-26 DIAGNOSIS — E340 Carcinoid syndrome: Secondary | ICD-10-CM | POA: Diagnosis not present

## 2021-07-26 MED ORDER — OCTREOTIDE ACETATE 30 MG IM KIT
30.0000 mg | PACK | Freq: Once | INTRAMUSCULAR | Status: AC
  Administered 2021-07-26: 30 mg via INTRAMUSCULAR
  Filled 2021-07-26: qty 1

## 2021-07-26 NOTE — Progress Notes (Signed)
Patient reports occasional cramping to bilateral flanks and thighs.  ?

## 2021-07-26 NOTE — Patient Instructions (Signed)
Octreotide injection solution What is this medication? OCTREOTIDE (ok TREE oh tide) is used to reduce blood levels of growth hormone in patients with a condition called acromegaly. This medicine also reduces flushing and watery diarrhea caused by certain types of cancer. This medicine may be used for other purposes; ask your health care provider or pharmacist if you have questions. COMMON BRAND NAME(S): Bynfezia, Sandostatin What should I tell my care team before I take this medication? They need to know if you have any of these conditions: diabetes gallbladder disease kidney disease liver disease thyroid disease an unusual or allergic reaction to octreotide, other medicines, foods, dyes, or preservatives pregnant or trying to get pregnant breast-feeding How should I use this medication? This medicine is for injection under the skin or into a vein (only in emergency situations). It is usually given by a health care professional in a hospital or clinic setting. If you get this medicine at home, you will be taught how to prepare and give this medicine. Allow the injection solution to come to room temperature before use. Do not warm it artificially. Use exactly as directed. Take your medicine at regular intervals. Do not take your medicine more often than directed. It is important that you put your used needles and syringes in a special sharps container. Do not put them in a trash can. If you do not have a sharps container, call your pharmacist or healthcare provider to get one. Talk to your pediatrician regarding the use of this medicine in children. Special care may be needed. Overdosage: If you think you have taken too much of this medicine contact a poison control center or emergency room at once. NOTE: This medicine is only for you. Do not share this medicine with others. What if I miss a dose? If you miss a dose, take it as soon as you can. If it is almost time for your next dose, take only  that dose. Do not take double or extra doses. What may interact with this medication? bromocriptine certain medicines for blood pressure, heart disease, irregular heartbeat cyclosporine diuretics medicines for diabetes, including insulin quinidine This list may not describe all possible interactions. Give your health care provider a list of all the medicines, herbs, non-prescription drugs, or dietary supplements you use. Also tell them if you smoke, drink alcohol, or use illegal drugs. Some items may interact with your medicine. What should I watch for while using this medication? Visit your doctor or health care professional for regular checks on your progress. To help reduce irritation at the injection site, use a different site for each injection and make sure the solution is at room temperature before use. This medicine may cause decreases in blood sugar. Signs of low blood sugar include chills, cool, pale skin or cold sweats, drowsiness, extreme hunger, fast heartbeat, headache, nausea, nervousness or anxiety, shakiness, trembling, unsteadiness, tiredness, or weakness. Contact your doctor or health care professional right away if you experience any of these symptoms. This medicine may increase blood sugar. Ask your healthcare provider if changes in diet or medicines are needed if you have diabetes. This medicine may cause a decrease in vitamin B12. You should make sure that you get enough vitamin B12 while you are taking this medicine. Discuss the foods you eat and the vitamins you take with your health care professional. What side effects may I notice from receiving this medication? Side effects that you should report to your doctor or health care professional as soon as   possible: allergic reactions like skin rash, itching or hives, swelling of the face, lips, or tongue fast, slow, or irregular heartbeat right upper belly pain severe stomach pain signs and symptoms of high blood sugar such  as being more thirsty or hungry or having to urinate more than normal. You may also feel very tired or have blurry vision. signs and symptoms of low blood sugar such as feeling anxious; confusion; dizziness; increased hunger; unusually weak or tired; increased sweating; shakiness; cold, clammy skin; irritable; headache; blurred vision; fast heartbeat; loss of consciousness unusually weak or tired Side effects that usually do not require medical attention (report to your doctor or health care professional if they continue or are bothersome): diarrhea dizziness gas headache nausea, vomiting pain, redness, or irritation at site where injected upset stomach This list may not describe all possible side effects. Call your doctor for medical advice about side effects. You may report side effects to FDA at 1-800-FDA-1088. Where should I keep my medication? Keep out of the reach of children. Store in a refrigerator between 2 and 8 degrees C (36 and 46 degrees F). Protect from light. Allow to come to room temperature naturally. Do not use artificial heat. If protected from light, the injection may be stored at room temperature between 20 and 30 degrees C (70 and 86 degrees F) for 14 days. After the initial use, throw away any unused portion of a multiple dose vial after 14 days. Throw away unused portions of the ampules after use. NOTE: This sheet is a summary. It may not cover all possible information. If you have questions about this medicine, talk to your doctor, pharmacist, or health care provider.  2022 Elsevier/Gold Standard (2018-11-12 00:00:00)  

## 2021-07-31 ENCOUNTER — Telehealth: Payer: Self-pay | Admitting: Oncology

## 2021-07-31 NOTE — Telephone Encounter (Signed)
CT Abd/Pel scheduled for 08/17/21 - checking in at 1:30. ? ?Notified spouse of appt info and instructions. ?

## 2021-08-19 NOTE — Progress Notes (Signed)
?[facility]  ?[address] ?[place],  [place]  [address] ?[phone] ? ?Clinic Day: 08/20/21 ? ?Referring physician: Jeanie Sewer, NP ? ?ASSESSMENT & PLAN:  ? ?1. 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  He does have a terminal ileum mass measuring 4.0 x 2.8 x 3.7 cm, and a mass of the mesentery of the small intestine measuring 3.9 x 2.3 x 4.0 cm.  As this has already metastasized, surgical resection is not indicated.  24 hour urine for 5 HIAA was quite elevated at 86.8, confirming carcinoid syndrome.  Chromogranin A was also elevated at 214. He started treatment with octreotide injections in October 2021 and continues to tolerate this without difficulty.  Due to the persistent diarrhea, we increased the octreotide to 30 mg every 4 weeks. CT imaging in August 2022 was stable. Chromogranin A from November was down to 118.6.  Current scan from April 2023 remains stable. ?  ?2. Multiple liver masses, the largest measuring 4.0 x 4.3 cm, consistent with metastatic carcinoid with carcinoid syndrome, which were stable on CT imaging.   ?  ?3. History of testicular cancer diagnosed in 1996, treated with orchiectomy and chemotherapy.  ?  ?4. Diarrhea, secondary to carcinoid malignancy.  This has improved with increasing octreotide to 30 mg. ?  ?5.  Chronic kidney disease, stable.  He denies being on any other medications and has not been taking NSAIDs.  I advised that he avoid ibuprofen and Aleeve. He knows to push fluids. ?  ?6.  Renal lithiasis.  He still has one stone in the left kidney and one at the left UPJ.     ? ?He will continue octreotide 30 mg monthly and will proceed with his next dose on Thursday.  We will plan to see him back in 12 weeks with a CBC, comprehensive metabolic panel, and chromogranin A.  The patient understands the plans discussed today and is in agreement with them.  He knows to contact our office if he  develops concerns prior to his next appointment. ? ?I provided 15 minutes of face-to-face time during this this encounter and > 50% was spent counseling as documented under my assessment and plan.  ? ? ?Derwood Kaplan, MD ?[facility] ?[address] ?[place] [address] ?Dept: [phone] ?Dept Fax: [phone]  ? ? ?CHIEF COMPLAINT:  ?CC: Metastatic carcinoid to liver ? ?Current Treatment:  Octreotide 30 mg IM monthly ? ? ?HISTORY OF PRESENT ILLNESS:  ?Glenn Bender is a 61 y.o. male with metastatic carcinoid to liver diagnosed in September 2021.  The patient presented in September with hematuria, felt to be due to a kidney stone.  CT abdomen and pelvis revealed multiple liver masses, the largest measuring 4.0 x 4.3 cm, a terminal ileum mass measuring 4.0 x 2.8 x 3.7 cm, and a mass of the mesentery of the small intestine measuring 3.9 x 2.3 x 4.0 cm.  Diagnostic colonoscopy with Dr. Coralie Keens in September revealed a large mucosal covered mass protruding through the ileocecal valve with peristalsis.  Biopsy was collected and surgical pathology was benign.  Liver biopsy revealed metastatic neuroendocrine tumor, grade 2, consistent with gastrointestinal primary. Synaptophysin and chromogranin-A immunostains were positive as well as CDX-2 supporting gastrointestinal origin.  Ki67 was 6%.  He reports flushing of his face with alcohol and certain foods.  He reported diarrhea, as well as flushing.  24 hour urine for 5HIAA  was also elevated at 86.8, which is consistent with carcinoid.  Chromogranin A was elevated at 214 in October and came down to 116.9 in December.  CT chest from November 2021 revealed left paratracheal adenopathy and pleural nodularity along the right hemidiaphragm measuring up to 12 mm, worrisome for metastatic disease. As he had metastatic disease, resection of his primary was not recommended. The patient was started on octreotide  20 mg IM monthly in October.  Octreotide was subsequently increased to 30 mg monthly in December due to persistent diarrhea. ?  ?CT chest, abdomen and pelvis from February 2022 revealed mostly stable to slightly decreased appearance of the carcinoid tumor, including the mesenteric lesion with cicatricial reaction, adjacent wall.  There was increased nodularity in a bandlike fashion along the mesenteric vessels adjacent to the carcinoid tumor. The liver lesions were stable with no new lesions identified. There was stable nodularity/lobularity along the right hemidiaphragm.  He has had a continued decrease in the Chromogranin A, which was 111.4 in February, and then 100.3 in March, which is within normal limits.  He had improvement in his diarrhea with the increase in octreotide. The chromogranin A was up slightly in May to 126. CT chest, abdomen and pelvis from August revealed no new or progressive metastatic disease.  Foci of masslike wall thickening within a central right abdominal small bowel loop and at the terminal ileum are stable. Calcified central mesenteric adenopathy is stable. Findings are suggestive of multifocal small bowel carcinoid with conglomerate mesenteric nodal metastases.  Widespread hyperenhancing liver metastases are stable.  Stable clustered subpleural solid pulmonary nodules along the right hemidiaphragm in the basilar right lower lobe are compatible with pulmonary metastases.  There is new background diffuse hepatic steatosis. Chromogranin A from August of 2022 was down to 118.6. ? ?INTERVAL HISTORY:  ?I have reviewed his chart and materials related to his cancer extensively and collaborated history with the patient. Summary of oncologic history is as follows: ?Oncology History  ?Malignant carcinoid tumor of unknown primary site [facility])  ?02/11/2020 Initial Diagnosis  ? Malignant carcinoid tumor of unknown primary site [facility]) ?  ? ? ?Glenn Bender is here for routine follow up prior to his next  octreotide. He is hypertensive today, but was very nervous about his CT scan results and I have asked him to follow-up with his primary care provider.  Otherwise, he has been well and denies complaints. Blood counts and chemistries are unremarkable except for a creatinine of 1.5, improved. Chromogranin A from November was 118.6, down from 126.0. . His  appetite is good, and he has gained 1 pound since his last visit.  He denies fever, chills or other signs of infection.  He denies nausea, vomiting, bowel issues, or abdominal pain.  He denies sore throat, cough, dyspnea, or chest pain. ? ?HISTORY:  ? ?Allergies: No Known Allergies ? ?Current Medications: ?Current Outpatient Medications  ?Medication Sig Dispense Refill  ? octreotide (SANDOSTATIN LAR) 20 MG injection Inject 20 mg into the muscle every 28 (twenty-eight) days.    ? ?No current facility-administered medications for this visit.  ? ? ?REVIEW OF SYSTEMS:  ?Review of Systems  ?Constitutional: Negative.  Negative for appetite change, chills, fatigue, fever and unexpected weight change.  ?HENT:  Negative.    ?Eyes: Negative.   ?Respiratory: Negative.  Negative for chest tightness, cough, hemoptysis, shortness of breath and wheezing.   ?Cardiovascular: Negative.  Negative for chest pain, leg swelling and palpitations.  ?Gastrointestinal: Negative.  Negative for abdominal distention, abdominal  pain, blood in stool, constipation, diarrhea, nausea and vomiting.  ?Endocrine: Negative.   ?Genitourinary: Negative.  Negative for difficulty urinating, dysuria, frequency and hematuria.   ?Musculoskeletal: Negative.  Negative for arthralgias, back pain, flank pain, gait problem and myalgias.  ?Skin: Negative.   ?Neurological: Negative.  Negative for dizziness, extremity weakness, gait problem, headaches, light-headedness, numbness, seizures and speech difficulty.  ?Hematological: Negative.   ?Psychiatric/Behavioral: Negative.  Negative for depression and sleep  disturbance. The patient is not nervous/anxious.   ?All other systems reviewed and are negative.  ? ? ?VITALS:  ?Blood pressure (!) 173/97, pulse 62, temperature 97.6 ?F (36.4 ?C), temperature source Oral, resp. rate 18, height

## 2021-08-20 ENCOUNTER — Encounter: Payer: Self-pay | Admitting: Oncology

## 2021-08-20 ENCOUNTER — Other Ambulatory Visit: Payer: Self-pay | Admitting: Oncology

## 2021-08-20 ENCOUNTER — Other Ambulatory Visit: Payer: Self-pay

## 2021-08-20 ENCOUNTER — Inpatient Hospital Stay: Payer: 59 | Attending: Oncology | Admitting: Oncology

## 2021-08-20 VITALS — BP 173/97 | HR 62 | Temp 97.6°F | Resp 18 | Ht 70.0 in | Wt 252.0 lb

## 2021-08-20 DIAGNOSIS — C7B8 Other secondary neuroendocrine tumors: Secondary | ICD-10-CM

## 2021-08-20 DIAGNOSIS — E340 Carcinoid syndrome: Secondary | ICD-10-CM | POA: Insufficient documentation

## 2021-08-20 DIAGNOSIS — C7A00 Malignant carcinoid tumor of unspecified site: Secondary | ICD-10-CM | POA: Insufficient documentation

## 2021-08-20 DIAGNOSIS — C7B02 Secondary carcinoid tumors of liver: Secondary | ICD-10-CM | POA: Insufficient documentation

## 2021-08-22 ENCOUNTER — Other Ambulatory Visit: Payer: Self-pay | Admitting: Oncology

## 2021-08-23 ENCOUNTER — Inpatient Hospital Stay: Payer: 59

## 2021-08-23 ENCOUNTER — Encounter: Payer: Self-pay | Admitting: Oncology

## 2021-08-23 VITALS — BP 146/90 | HR 66 | Temp 97.6°F | Resp 18 | Ht 70.0 in | Wt 253.8 lb

## 2021-08-23 DIAGNOSIS — C7B02 Secondary carcinoid tumors of liver: Secondary | ICD-10-CM | POA: Diagnosis present

## 2021-08-23 DIAGNOSIS — C7B8 Other secondary neuroendocrine tumors: Secondary | ICD-10-CM

## 2021-08-23 DIAGNOSIS — C7A00 Malignant carcinoid tumor of unspecified site: Secondary | ICD-10-CM | POA: Diagnosis present

## 2021-08-23 DIAGNOSIS — E340 Carcinoid syndrome: Secondary | ICD-10-CM | POA: Diagnosis present

## 2021-08-23 MED ORDER — OCTREOTIDE ACETATE 30 MG IM KIT
30.0000 mg | PACK | Freq: Once | INTRAMUSCULAR | Status: AC
  Administered 2021-08-23: 30 mg via INTRAMUSCULAR
  Filled 2021-08-23: qty 1

## 2021-08-23 NOTE — Patient Instructions (Signed)
Octreotide acetate injection suspension ?What is this medication? ?OCTREOTIDE (ok TREE oh tide) is used to reduce blood levels of growth hormone in patients with a condition called acromegaly. This medicine also reduces flushing and watery diarrhea caused by certain types of cancer. ?This medicine may be used for other purposes; ask your health care provider or pharmacist if you have questions. ?COMMON BRAND NAME(S): Sandostatin LAR ?What should I tell my care team before I take this medication? ?They need to know if you have any of these conditions: ?diabetes ?gallbladder disease ?kidney disease ?liver disease ?thyroid disease ?an unusual or allergic reaction to octreotide, other medicines, foods, dyes, or preservatives ?pregnant or trying to get pregnant ?breast-feeding ?How should I use this medication? ?This medicine is for injection into a muscle. It is usually given by a health care professional in a hospital or clinic setting. ?Talk to your pediatrician regarding the use of this medicine in children. Special care may be needed. ?Overdosage: If you think you have taken too much of this medicine contact a poison control center or emergency room at once. ?NOTE: This medicine is only for you. Do not share this medicine with others. ?What if I miss a dose? ?Keep appointments for follow-up doses. It is important not to miss your dose. Call your doctor or health care professional if you are unable to keep an appointment. ?What may interact with this medication? ?Do not take this medicine with any of the following medications: ?cisapride ?dronedarone ?flibanserin ?lutetium Lu 177 dotatate ?pimozide ?saquinavir ?thioridazine ?This medicine may also interact with the following medications: ?bromocriptine ?certain medicines for blood pressure, heart disease, irregular heartbeat ?cyclosporine ?diuretics ?medicines for diabetes, including insulin ?quinidine ?This list may not describe all possible interactions. Give your  health care provider a list of all the medicines, herbs, non-prescription drugs, or dietary supplements you use. Also tell them if you smoke, drink alcohol, or use illegal drugs. Some items may interact with your medicine. ?What should I watch for while using this medication? ?Visit your care team for regular checks on your progress. Tell your care team if your symptoms do not start to get better or if they get worse. ?This medication may cause decreases in blood sugar. Signs of low blood sugar include chills, cool, pale skin or cold sweats, drowsiness, extreme hunger, fast heartbeat, headache, nausea, nervousness or anxiety, shakiness, trembling, unsteadiness, tiredness, or weakness. Contact your care team right away if you experience any of these symptoms. ?This medication may increase blood sugar. The risk may be higher in patients who already have diabetes. Ask your care team what you can do to lower your risk of diabetes while taking this medication. ?You should make sure you get enough vitamin B12 while you are taking this medication. Discuss the foods you eat and the vitamins you take with your care team. ?What side effects may I notice from receiving this medication? ?Side effects that you should report to your doctor or health care professional as soon as possible: ?allergic reactions like skin rash, itching or hives, swelling of the face, lips, or tongue ?fast, slow, or irregular heartbeat ?right upper belly pain ?severe stomach pain ?signs and symptoms of high blood sugar such as being more thirsty or hungry or having to urinate more than normal. You may also feel very tired or have blurry vision. ?signs and symptoms of low blood sugar such as feeling anxious; confusion; dizziness; increased hunger; unusually weak or tired; increased sweating; shakiness; cold, clammy skin; irritable; headache; blurred  vision; fast heartbeat; loss of consciousness unusually weak or tired Side effects that usually do not  require medical attention (report these to your doctor or health care professional if they continue or are bothersome): diarrhea dizziness gas headache nausea, vomiting pain, redness, or irritation at site where injected upset stomach This list may not describe all possible side effects. Call your doctor for medical advice about side effects. You may report side effects to FDA at 1-800-FDA-1088. Where should I keep my medication? This medicine is given in a hospital or clinic and will not be stored at home. NOTE: This sheet is a summary. It may not cover all possible information. If you have questions about this medicine, talk to your doctor, pharmacist, or health care provider.  2023 Elsevier/Gold Standard (2021-04-06 00:00:00)  

## 2021-09-01 ENCOUNTER — Encounter: Payer: Self-pay | Admitting: Oncology

## 2021-09-05 ENCOUNTER — Other Ambulatory Visit: Payer: Self-pay | Admitting: Hematology and Oncology

## 2021-09-05 ENCOUNTER — Telehealth: Payer: Self-pay

## 2021-09-05 MED ORDER — DICYCLOMINE HCL 10 MG PO CAPS: 10.0000 mg | ORAL_CAPSULE | Freq: Three times a day (TID) | ORAL | 1 refills | Status: DC | PRN

## 2021-09-05 MED ORDER — ONDANSETRON HCL 4 MG PO TABS: 4.0000 mg | ORAL_TABLET | Freq: Three times a day (TID) | ORAL | 1 refills | Status: DC | PRN

## 2021-09-05 NOTE — Telephone Encounter (Signed)
Spoke with the patient's wife. She states he is sleeping now, so she doesn't know if he is still having nausea and/or vomiting. She states he has had intermittent abdominal pain since before diagnosis and reports his abdomen feels firm during the episode. I advised her he should not be drinking alcohol because his liver is already damaged, but we will need to discuss with him at his next visit. I will send in ondansetron and dicyclomine to use as needed.  If these are not effective she will contact us. ?

## 2021-09-05 NOTE — Telephone Encounter (Signed)
Patient notified that letter is ready to be picked up at his convenience. ? ?

## 2021-09-05 NOTE — Telephone Encounter (Signed)
-----   Message from Melodye Ped, NP sent at 09/05/2021 12:00 PM EDT ----- ?Regarding: RE: Note for [employer] Savage duty ?Letter written and ready for him to pick up. ?----- Message ----- ?From: Belva Chimes, LPN ?Sent: 09/05/2021  11:48 AM EDT ?To: Melodye Ped, NP ?Subject: Note for Madaline Savage duty                            ? ?Patient is wanting to know if we can write a note to excuse him from jury duty. He has jury duty on Sep 17, 2021. Call back number for patient is [phone]. ? ? ?

## 2021-09-05 NOTE — Telephone Encounter (Signed)
Pt's wife came into clinic to pick up forms. She reports that Mann is having a lot of pain in right flank area, describes as squeezing, since yesterday. He has also been vomiting since yesterday. She said the went to their daughters home yesterday afternoon and ate spaghetti. He had a few beers, and when he got home he started with the vomiting. She just wanted to ask if there is anything we could do to help him with the N/V and pain? Do we think his symptoms could be coming from the spaghetti? He does not have anything for nausea in the home. His wife also mentions he drank heavily Monday evening.  I sent the above message to Dr Hinton Rao 09/05/2021 @ [id number]-awc ?

## 2021-09-06 ENCOUNTER — Telehealth: Payer: Self-pay

## 2021-09-06 NOTE — Telephone Encounter (Addendum)
? ?  RE: Going to ER @ [facility] ?Received: Today ?Mosher, Beryle Flock, RN ?He was admitted. He had kidney stone obstructing right ureter. I thought of kidney stone, but last imaging showed stone in left kidney and pain was on right. I'm glad he finally went to the ER.  ? ? ?09/06/21 Shirlean Mylar, pt's spouse called to make [place] aware that she was taking [name] to the ER @ [facility]. His pain isn't better, & he is still nauseated. No emesis. I sent message to [facility], to f/u tomorrow to see if he is admitted. ?

## 2021-09-07 ENCOUNTER — Encounter: Payer: Self-pay | Admitting: Oncology

## 2021-09-18 ENCOUNTER — Encounter: Payer: Self-pay | Admitting: Oncology

## 2021-09-19 ENCOUNTER — Inpatient Hospital Stay: Payer: 59 | Attending: Oncology

## 2021-09-19 VITALS — BP 133/79 | HR 73 | Temp 98.1°F | Resp 18 | Ht 70.0 in | Wt 249.0 lb

## 2021-09-19 DIAGNOSIS — C7B8 Other secondary neuroendocrine tumors: Secondary | ICD-10-CM

## 2021-09-19 DIAGNOSIS — C7A00 Malignant carcinoid tumor of unspecified site: Secondary | ICD-10-CM | POA: Diagnosis present

## 2021-09-19 DIAGNOSIS — E340 Carcinoid syndrome: Secondary | ICD-10-CM | POA: Insufficient documentation

## 2021-09-19 DIAGNOSIS — C7B02 Secondary carcinoid tumors of liver: Secondary | ICD-10-CM | POA: Diagnosis present

## 2021-09-19 MED ORDER — OCTREOTIDE ACETATE 30 MG IM KIT
30.0000 mg | PACK | Freq: Once | INTRAMUSCULAR | Status: AC
  Administered 2021-09-19: 30 mg via INTRAMUSCULAR
  Filled 2021-09-19: qty 1

## 2021-09-19 NOTE — Patient Instructions (Signed)
Octreotide injection solution ?What is this medication? ?OCTREOTIDE (ok TREE oh tide) is used to reduce blood levels of growth hormone in patients with a condition called acromegaly. This medicine also reduces flushing and watery diarrhea caused by certain types of cancer. ?This medicine may be used for other purposes; ask your health care provider or pharmacist if you have questions. ?COMMON BRAND NAME(S): Bynfezia, Sandostatin ?What should I tell my care team before I take this medication? ?They need to know if you have any of these conditions: ?diabetes ?gallbladder disease ?kidney disease ?liver disease ?thyroid disease ?an unusual or allergic reaction to octreotide, other medicines, foods, dyes, or preservatives ?pregnant or trying to get pregnant ?breast-feeding ?How should I use this medication? ?This medication is injected under the skin or into a vein. It is usually given by your care team in a hospital or clinic setting. ?If you get this medication at home, you will be taught how to prepare and give it. Use exactly as directed. Take it as directed on the prescription label at the same time every day. Keep taking it unless your care team tells you to stop. ?Allow the injection solution to come to room temperature before use. Do not warm it artificially. ?It is important that you put your used needles and syringes in a special sharps container. Do not put them in a trash can. If you do not have a sharps container, call your pharmacist or care team to get one. ?Talk to your care team about the use of this medication in children. Special care may be needed. ?Overdosage: If you think you have taken too much of this medicine contact a poison control center or emergency room at once. ?NOTE: This medicine is only for you. Do not share this medicine with others. ?What if I miss a dose? ?If you miss a dose, take it as soon as you can. If it is almost time for your next dose, take only that dose. Do not take double  or extra doses. ?What may interact with this medication? ?bromocriptine ?certain medicines for blood pressure, heart disease, irregular heartbeat ?cyclosporine ?diuretics ?medicines for diabetes, including insulin ?quinidine ?This list may not describe all possible interactions. Give your health care provider a list of all the medicines, herbs, non-prescription drugs, or dietary supplements you use. Also tell them if you smoke, drink alcohol, or use illegal drugs. Some items may interact with your medicine. ?What should I watch for while using this medication? ?Visit your care team for regular checks on your progress. Tell your care team if your symptoms do not start to get better or if they get worse. ?To help reduce irritation at the injection site, use a different site for each injection and make sure the solution is at room temperature before use. ?This medication may cause decreases in blood sugar. Signs of low blood sugar include chills, cool, pale skin or cold sweats, drowsiness, extreme hunger, fast heartbeat, headache, nausea, nervousness or anxiety, shakiness, trembling, unsteadiness, tiredness, or weakness. Contact your care team right away if you experience any of these symptoms. ?This medication may increase blood sugar. The risk may be higher in patients who already have diabetes. Ask your care team what you can do to lower your risk of diabetes while taking this medication. ?You should make sure you get enough vitamin B12 while you are taking this medication. Discuss the foods you eat and the vitamins you take with your care team. ?What side effects may I notice from receiving   this medication? ?Side effects that you should report to your doctor or health care professional as soon as possible: ?allergic reactions like skin rash, itching or hives, swelling of the face, lips, or tongue ?fast, slow, or irregular heartbeat ?right upper belly pain ?severe stomach pain ?signs and symptoms of high blood sugar  such as being more thirsty or hungry or having to urinate more than normal. You may also feel very tired or have blurry vision. ?signs and symptoms of low blood sugar such as feeling anxious; confusion; dizziness; increased hunger; unusually weak or tired; increased sweating; shakiness; cold, clammy skin; irritable; headache; blurred vision; fast heartbeat; loss of consciousness ?unusually weak or tired ?Side effects that usually do not require medical attention (report to your doctor or health care professional if they continue or are bothersome): ?diarrhea ?dizziness ?gas ?headache ?nausea, vomiting ?pain, redness, or irritation at site where injected ?upset stomach ?This list may not describe all possible side effects. Call your doctor for medical advice about side effects. You may report side effects to FDA at 1-800-FDA-1088. ?Where should I keep my medication? ?Keep out of the reach of children and pets. ?Store in the refrigerator. Protect from light. Allow to come to room temperature naturally. Do not use artificial heat. If protected from light, the injection may be stored between 20 and 30 degrees C (70 and 86 degrees F) for 14 days. After the initial use, throw away any unused portion of a multiple dose vial after 14 days. Get rid of any unused portions of the ampules after use. ?To get rid of medications that are no longer needed or have expired: ?Take the medication to a medication take-back program. Ask your pharmacy or law enforcement to find a location. ?If you cannot return the medication, ask your pharmacist or care team how to get rid of the medication safely. ?NOTE: This sheet is a summary. It may not cover all possible information. If you have questions about this medicine, talk to your doctor, pharmacist, or health care provider. ?? 2023 Elsevier/Gold Standard (2021-04-06 00:00:00) ? ?

## 2021-10-17 ENCOUNTER — Inpatient Hospital Stay: Payer: 59 | Attending: Oncology

## 2021-10-17 VITALS — BP 153/92 | HR 73 | Temp 97.7°F | Resp 18 | Wt 252.1 lb

## 2021-10-17 DIAGNOSIS — C7A012 Malignant carcinoid tumor of the ileum: Secondary | ICD-10-CM | POA: Insufficient documentation

## 2021-10-17 DIAGNOSIS — E340 Carcinoid syndrome: Secondary | ICD-10-CM | POA: Insufficient documentation

## 2021-10-17 DIAGNOSIS — C7B8 Other secondary neuroendocrine tumors: Secondary | ICD-10-CM

## 2021-10-17 DIAGNOSIS — C7B02 Secondary carcinoid tumors of liver: Secondary | ICD-10-CM | POA: Diagnosis present

## 2021-10-17 DIAGNOSIS — C7A00 Malignant carcinoid tumor of unspecified site: Secondary | ICD-10-CM

## 2021-10-17 MED ORDER — OCTREOTIDE ACETATE 30 MG IM KIT
30.0000 mg | PACK | Freq: Once | INTRAMUSCULAR | Status: AC
  Administered 2021-10-17: 30 mg via INTRAMUSCULAR
  Filled 2021-10-17: qty 1

## 2021-10-17 NOTE — Patient Instructions (Signed)
Octreotide injection solution ?What is this medication? ?OCTREOTIDE (ok TREE oh tide) is used to reduce blood levels of growth hormone in patients with a condition called acromegaly. This medicine also reduces flushing and watery diarrhea caused by certain types of cancer. ?This medicine may be used for other purposes; ask your health care provider or pharmacist if you have questions. ?COMMON BRAND NAME(S): Bynfezia, Sandostatin ?What should I tell my care team before I take this medication? ?They need to know if you have any of these conditions: ?diabetes ?gallbladder disease ?kidney disease ?liver disease ?thyroid disease ?an unusual or allergic reaction to octreotide, other medicines, foods, dyes, or preservatives ?pregnant or trying to get pregnant ?breast-feeding ?How should I use this medication? ?This medication is injected under the skin or into a vein. It is usually given by your care team in a hospital or clinic setting. ?If you get this medication at home, you will be taught how to prepare and give it. Use exactly as directed. Take it as directed on the prescription label at the same time every day. Keep taking it unless your care team tells you to stop. ?Allow the injection solution to come to room temperature before use. Do not warm it artificially. ?It is important that you put your used needles and syringes in a special sharps container. Do not put them in a trash can. If you do not have a sharps container, call your pharmacist or care team to get one. ?Talk to your care team about the use of this medication in children. Special care may be needed. ?Overdosage: If you think you have taken too much of this medicine contact a poison control center or emergency room at once. ?NOTE: This medicine is only for you. Do not share this medicine with others. ?What if I miss a dose? ?If you miss a dose, take it as soon as you can. If it is almost time for your next dose, take only that dose. Do not take double  or extra doses. ?What may interact with this medication? ?bromocriptine ?certain medicines for blood pressure, heart disease, irregular heartbeat ?cyclosporine ?diuretics ?medicines for diabetes, including insulin ?quinidine ?This list may not describe all possible interactions. Give your health care provider a list of all the medicines, herbs, non-prescription drugs, or dietary supplements you use. Also tell them if you smoke, drink alcohol, or use illegal drugs. Some items may interact with your medicine. ?What should I watch for while using this medication? ?Visit your care team for regular checks on your progress. Tell your care team if your symptoms do not start to get better or if they get worse. ?To help reduce irritation at the injection site, use a different site for each injection and make sure the solution is at room temperature before use. ?This medication may cause decreases in blood sugar. Signs of low blood sugar include chills, cool, pale skin or cold sweats, drowsiness, extreme hunger, fast heartbeat, headache, nausea, nervousness or anxiety, shakiness, trembling, unsteadiness, tiredness, or weakness. Contact your care team right away if you experience any of these symptoms. ?This medication may increase blood sugar. The risk may be higher in patients who already have diabetes. Ask your care team what you can do to lower your risk of diabetes while taking this medication. ?You should make sure you get enough vitamin B12 while you are taking this medication. Discuss the foods you eat and the vitamins you take with your care team. ?What side effects may I notice from receiving   this medication? ?Side effects that you should report to your doctor or health care professional as soon as possible: ?allergic reactions like skin rash, itching or hives, swelling of the face, lips, or tongue ?fast, slow, or irregular heartbeat ?right upper belly pain ?severe stomach pain ?signs and symptoms of high blood sugar  such as being more thirsty or hungry or having to urinate more than normal. You may also feel very tired or have blurry vision. ?signs and symptoms of low blood sugar such as feeling anxious; confusion; dizziness; increased hunger; unusually weak or tired; increased sweating; shakiness; cold, clammy skin; irritable; headache; blurred vision; fast heartbeat; loss of consciousness ?unusually weak or tired ?Side effects that usually do not require medical attention (report to your doctor or health care professional if they continue or are bothersome): ?diarrhea ?dizziness ?gas ?headache ?nausea, vomiting ?pain, redness, or irritation at site where injected ?upset stomach ?This list may not describe all possible side effects. Call your doctor for medical advice about side effects. You may report side effects to FDA at 1-800-FDA-1088. ?Where should I keep my medication? ?Keep out of the reach of children and pets. ?Store in the refrigerator. Protect from light. Allow to come to room temperature naturally. Do not use artificial heat. If protected from light, the injection may be stored between 20 and 30 degrees C (70 and 86 degrees F) for 14 days. After the initial use, throw away any unused portion of a multiple dose vial after 14 days. Get rid of any unused portions of the ampules after use. ?To get rid of medications that are no longer needed or have expired: ?Take the medication to a medication take-back program. Ask your pharmacy or law enforcement to find a location. ?If you cannot return the medication, ask your pharmacist or care team how to get rid of the medication safely. ?NOTE: This sheet is a summary. It may not cover all possible information. If you have questions about this medicine, talk to your doctor, pharmacist, or health care provider. ?? 2023 Elsevier/Gold Standard (2021-04-06 00:00:00) ? ?

## 2021-11-14 ENCOUNTER — Inpatient Hospital Stay: Payer: 59 | Attending: Oncology

## 2021-11-14 VITALS — BP 135/82 | HR 82 | Temp 98.0°F | Resp 18 | Ht 70.0 in | Wt 252.0 lb

## 2021-11-14 DIAGNOSIS — Z79899 Other long term (current) drug therapy: Secondary | ICD-10-CM | POA: Diagnosis not present

## 2021-11-14 DIAGNOSIS — C7A00 Malignant carcinoid tumor of unspecified site: Secondary | ICD-10-CM

## 2021-11-14 DIAGNOSIS — C7A012 Malignant carcinoid tumor of the ileum: Secondary | ICD-10-CM | POA: Diagnosis present

## 2021-11-14 DIAGNOSIS — C7B8 Other secondary neuroendocrine tumors: Secondary | ICD-10-CM

## 2021-11-14 DIAGNOSIS — C7B02 Secondary carcinoid tumors of liver: Secondary | ICD-10-CM | POA: Insufficient documentation

## 2021-11-14 DIAGNOSIS — E340 Carcinoid syndrome: Secondary | ICD-10-CM | POA: Insufficient documentation

## 2021-11-14 MED ORDER — OCTREOTIDE ACETATE 30 MG IM KIT
30.0000 mg | PACK | Freq: Once | INTRAMUSCULAR | Status: AC
  Administered 2021-11-14: 30 mg via INTRAMUSCULAR

## 2021-11-14 NOTE — Patient Instructions (Signed)
Octreotide injection solution ?What is this medication? ?OCTREOTIDE (ok TREE oh tide) is used to reduce blood levels of growth hormone in patients with a condition called acromegaly. This medicine also reduces flushing and watery diarrhea caused by certain types of cancer. ?This medicine may be used for other purposes; ask your health care provider or pharmacist if you have questions. ?COMMON BRAND NAME(S): Bynfezia, Sandostatin ?What should I tell my care team before I take this medication? ?They need to know if you have any of these conditions: ?diabetes ?gallbladder disease ?kidney disease ?liver disease ?thyroid disease ?an unusual or allergic reaction to octreotide, other medicines, foods, dyes, or preservatives ?pregnant or trying to get pregnant ?breast-feeding ?How should I use this medication? ?This medication is injected under the skin or into a vein. It is usually given by your care team in a hospital or clinic setting. ?If you get this medication at home, you will be taught how to prepare and give it. Use exactly as directed. Take it as directed on the prescription label at the same time every day. Keep taking it unless your care team tells you to stop. ?Allow the injection solution to come to room temperature before use. Do not warm it artificially. ?It is important that you put your used needles and syringes in a special sharps container. Do not put them in a trash can. If you do not have a sharps container, call your pharmacist or care team to get one. ?Talk to your care team about the use of this medication in children. Special care may be needed. ?Overdosage: If you think you have taken too much of this medicine contact a poison control center or emergency room at once. ?NOTE: This medicine is only for you. Do not share this medicine with others. ?What if I miss a dose? ?If you miss a dose, take it as soon as you can. If it is almost time for your next dose, take only that dose. Do not take double  or extra doses. ?What may interact with this medication? ?bromocriptine ?certain medicines for blood pressure, heart disease, irregular heartbeat ?cyclosporine ?diuretics ?medicines for diabetes, including insulin ?quinidine ?This list may not describe all possible interactions. Give your health care provider a list of all the medicines, herbs, non-prescription drugs, or dietary supplements you use. Also tell them if you smoke, drink alcohol, or use illegal drugs. Some items may interact with your medicine. ?What should I watch for while using this medication? ?Visit your care team for regular checks on your progress. Tell your care team if your symptoms do not start to get better or if they get worse. ?To help reduce irritation at the injection site, use a different site for each injection and make sure the solution is at room temperature before use. ?This medication may cause decreases in blood sugar. Signs of low blood sugar include chills, cool, pale skin or cold sweats, drowsiness, extreme hunger, fast heartbeat, headache, nausea, nervousness or anxiety, shakiness, trembling, unsteadiness, tiredness, or weakness. Contact your care team right away if you experience any of these symptoms. ?This medication may increase blood sugar. The risk may be higher in patients who already have diabetes. Ask your care team what you can do to lower your risk of diabetes while taking this medication. ?You should make sure you get enough vitamin B12 while you are taking this medication. Discuss the foods you eat and the vitamins you take with your care team. ?What side effects may I notice from receiving   this medication? ?Side effects that you should report to your doctor or health care professional as soon as possible: ?allergic reactions like skin rash, itching or hives, swelling of the face, lips, or tongue ?fast, slow, or irregular heartbeat ?right upper belly pain ?severe stomach pain ?signs and symptoms of high blood sugar  such as being more thirsty or hungry or having to urinate more than normal. You may also feel very tired or have blurry vision. ?signs and symptoms of low blood sugar such as feeling anxious; confusion; dizziness; increased hunger; unusually weak or tired; increased sweating; shakiness; cold, clammy skin; irritable; headache; blurred vision; fast heartbeat; loss of consciousness ?unusually weak or tired ?Side effects that usually do not require medical attention (report to your doctor or health care professional if they continue or are bothersome): ?diarrhea ?dizziness ?gas ?headache ?nausea, vomiting ?pain, redness, or irritation at site where injected ?upset stomach ?This list may not describe all possible side effects. Call your doctor for medical advice about side effects. You may report side effects to FDA at 1-800-FDA-1088. ?Where should I keep my medication? ?Keep out of the reach of children and pets. ?Store in the refrigerator. Protect from light. Allow to come to room temperature naturally. Do not use artificial heat. If protected from light, the injection may be stored between 20 and 30 degrees C (70 and 86 degrees F) for 14 days. After the initial use, throw away any unused portion of a multiple dose vial after 14 days. Get rid of any unused portions of the ampules after use. ?To get rid of medications that are no longer needed or have expired: ?Take the medication to a medication take-back program. Ask your pharmacy or law enforcement to find a location. ?If you cannot return the medication, ask your pharmacist or care team how to get rid of the medication safely. ?NOTE: This sheet is a summary. It may not cover all possible information. If you have questions about this medicine, talk to your doctor, pharmacist, or health care provider. ?? 2023 Elsevier/Gold Standard (2021-04-06 00:00:00) ? ?

## 2021-11-19 ENCOUNTER — Ambulatory Visit: Payer: Self-pay | Admitting: Oncology

## 2021-11-19 ENCOUNTER — Other Ambulatory Visit: Payer: 59

## 2021-11-26 ENCOUNTER — Other Ambulatory Visit: Payer: Self-pay | Admitting: Oncology

## 2021-11-26 ENCOUNTER — Inpatient Hospital Stay: Payer: 59

## 2021-11-26 ENCOUNTER — Encounter: Payer: Self-pay | Admitting: Oncology

## 2021-11-26 ENCOUNTER — Inpatient Hospital Stay (INDEPENDENT_AMBULATORY_CARE_PROVIDER_SITE_OTHER): Payer: 59 | Admitting: Oncology

## 2021-11-26 VITALS — BP 149/81 | HR 66 | Temp 98.3°F | Resp 18 | Ht 70.0 in | Wt 251.6 lb

## 2021-11-26 DIAGNOSIS — C7A00 Malignant carcinoid tumor of unspecified site: Secondary | ICD-10-CM

## 2021-11-26 DIAGNOSIS — E340 Carcinoid syndrome: Secondary | ICD-10-CM | POA: Diagnosis not present

## 2021-11-26 DIAGNOSIS — C7B8 Other secondary neuroendocrine tumors: Secondary | ICD-10-CM | POA: Diagnosis not present

## 2021-11-26 LAB — BASIC METABOLIC PANEL
BUN: 17 (ref 4–21)
CO2: 16 (ref 13–22)
Chloride: 110 — AB (ref 99–108)
Creatinine: 1.6 — AB (ref 0.6–1.3)
Glucose: 120
Potassium: 3.8 mEq/L (ref 3.5–5.1)
Sodium: 139 (ref 137–147)

## 2021-11-26 LAB — HEPATIC FUNCTION PANEL
ALT: 36 U/L (ref 10–40)
AST: 34 (ref 14–40)
Alkaline Phosphatase: 41 (ref 25–125)
Bilirubin, Total: 0.6

## 2021-11-26 LAB — CBC AND DIFFERENTIAL
HCT: 41 (ref 41–53)
Hemoglobin: 14 (ref 13.5–17.5)
Neutrophils Absolute: 4.29
Platelets: 154 10*3/uL (ref 150–400)
WBC: 6.4

## 2021-11-26 LAB — COMPREHENSIVE METABOLIC PANEL
Albumin: 4 (ref 3.5–5.0)
Calcium: 8.5 — AB (ref 8.7–10.7)

## 2021-11-26 LAB — CBC: RBC: 4.73 (ref 3.87–5.11)

## 2021-11-26 NOTE — Progress Notes (Signed)
[facility]  [address] [place],  [place]  [address] [phone]  Clinic Day: 11/26/21  Referring physician: Brantley Fling Me*  ASSESSMENT & PLAN:   1. 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  He does have a terminal ileum mass measuring 4.0 x 2.8 x 3.7 cm, and a mass of the mesentery of the small intestine measuring 3.9 x 2.3 x 4.0 cm.  As this has already metastasized, surgical resection is not indicated.  24 hour urine for 5 HIAA was quite elevated at 86.8, confirming carcinoid syndrome.  Chromogranin A was also elevated at 214. He started treatment with octreotide injections in October 2021 and continues to tolerate this without difficulty.  Due to the persistent diarrhea, we increased the octreotide to 30 mg every 4 weeks. CT imaging in August 2022 was stable. Chromogranin A from November was down to 118.6.  CT scan from April 2023 remains stable.   2. Multiple liver masses, the largest measuring 4.0 x 4.3 cm, consistent with metastatic carcinoid with carcinoid syndrome, which were stable on CT imaging.     3. History of testicular cancer diagnosed in 1996, treated with orchiectomy and chemotherapy.    4. Diarrhea, secondary to carcinoid malignancy.  This has improved with increasing octreotide to 30 mg.   5.  Chronic kidney disease, stable.  He denies being on any other medications and has not been taking NSAIDs.  I advised that he avoid ibuprofen and Aleve. He knows to push fluids.   6.  Renal lithiasis.  He still has one stone in the left kidney and one at the left UPJ.      He will continue octreotide 30 mg monthly and will proceed with his next dose on 8/16.  We will plan to see him back in 12 weeks with a CBC, comprehensive metabolic panel, chromogranin A, and CT scans of abdomen and pelvis.  The patient understands the plans discussed today and is in agreement with them.  He knows to contact  our office if he develops concerns prior to his next appointment.  I provided 15 minutes of face-to-face time during this this encounter and > 50% was spent counseling as documented under my assessment and plan.    Glenn Kaplan, MD [facility] [address] [place] [address] Dept: [phone] Dept Fax: [phone]    CHIEF COMPLAINT:  CC: Metastatic carcinoid to liver  Current Treatment:  Octreotide 30 mg IM monthly   HISTORY OF PRESENT ILLNESS:  Glenn Bender is a 61 y.o. male with metastatic carcinoid to liver diagnosed in September 2021.  The patient presented in September with hematuria, felt to be due to a kidney stone.  CT abdomen and pelvis revealed multiple liver masses, the largest measuring 4.0 x 4.3 cm, a terminal ileum mass measuring 4.0 x 2.8 x 3.7 cm, and a mass of the mesentery of the small intestine measuring 3.9 x 2.3 x 4.0 cm.  Diagnostic colonoscopy with Dr. Coralie Bender in September revealed a large mucosal covered mass protruding through the ileocecal valve with peristalsis.  Biopsy was collected and surgical pathology was benign.  Liver biopsy revealed metastatic neuroendocrine tumor, grade 2, consistent with gastrointestinal primary. Synaptophysin and chromogranin-A immunostains were positive as well as CDX-2 supporting gastrointestinal origin.  Ki67 was 6%.  He reports flushing of his face with alcohol and certain foods.  He reported diarrhea, as well as flushing.  24 hour urine for 5HIAA was also elevated at 86.8, which is consistent with carcinoid.  Chromogranin A was elevated at 214 in October and came down to 116.9 in December.  CT chest from November 2021 revealed left paratracheal adenopathy and pleural nodularity along the right hemidiaphragm measuring up to 12 mm, worrisome for metastatic disease. As he had metastatic disease, resection of his primary was not recommended. The patient was  started on octreotide 20 mg IM monthly in October.  Octreotide was subsequently increased to 30 mg monthly in December due to persistent diarrhea.   CT chest, abdomen and pelvis from February 2022 revealed mostly stable to slightly decreased appearance of the carcinoid tumor, including the mesenteric lesion with cicatricial reaction, adjacent wall.  There was increased nodularity in a bandlike fashion along the mesenteric vessels adjacent to the carcinoid tumor. The liver lesions were stable with no new lesions identified. There was stable nodularity/lobularity along the right hemidiaphragm.  He has had a continued decrease in the Chromogranin A, which was 111.4 in February, and then 100.3 in March, which is within normal limits.  He had improvement in his diarrhea with the increase in octreotide. The chromogranin A was up slightly in May to 126. CT chest, abdomen and pelvis from August revealed no new or progressive metastatic disease.  Foci of masslike wall thickening within a central right abdominal small bowel loop and at the terminal ileum are stable. Calcified central mesenteric adenopathy is stable. Findings are suggestive of multifocal small bowel carcinoid with conglomerate mesenteric nodal metastases.  Widespread hyperenhancing liver metastases are stable.  Stable clustered subpleural solid pulmonary nodules along the right hemidiaphragm in the basilar right lower lobe are compatible with pulmonary metastases.  There is new background diffuse hepatic steatosis. Chromogranin A from August of 2022 was down to 118.6.  INTERVAL HISTORY:  I have reviewed his chart and materials related to his cancer extensively and collaborated history with the patient. Summary of oncologic history is as follows: Oncology History  Malignant carcinoid tumor of unknown primary site [facility])  02/11/2020 Initial Diagnosis   Malignant carcinoid tumor of unknown primary site [facility])     Glenn Bender is here for routine follow up  prior to his next octreotide. He had a kidney stone recently. He also had an incident with his chain saw and then with fire ants.  Otherwise, he has been well and denies complaints. Blood counts and chemistries are unremarkable except for a creatinine of 1.6, improved. Chromogranin A from November was down to 118.6, but now up to 151.7. His  appetite is good, and he has lost 1 pound since his last visit.  He denies fever, chills or other signs of infection.  He denies nausea, vomiting, bowel issues, or abdominal pain.  He denies sore throat, cough, dyspnea, or chest pain.  HISTORY:   Allergies: No Known Allergies  Current Medications: Current Outpatient Medications  Medication Sig Dispense Refill   dicyclomine (BENTYL) 10 MG capsule Take 1 capsule (10 mg total) by mouth every 8 (eight) hours as needed for spasms. Abdominal pain 60 capsule 1   octreotide (SANDOSTATIN LAR) 20 MG injection Inject 20 mg into the muscle every 28 (twenty-eight) days.     No current facility-administered medications for this visit.    REVIEW OF SYSTEMS:  Review of Systems  Constitutional: Negative.  Negative for appetite change, chills, fatigue, fever and unexpected weight change.  HENT:  Negative.    Eyes: Negative.   Respiratory: Negative.  Negative for  chest tightness, cough, hemoptysis, shortness of breath and wheezing.   Cardiovascular: Negative.  Negative for chest pain, leg swelling and palpitations.  Gastrointestinal: Negative.  Negative for abdominal distention, abdominal pain, blood in stool, constipation, diarrhea, nausea and vomiting.  Endocrine: Negative.   Genitourinary: Negative.  Negative for difficulty urinating, dysuria, frequency and hematuria.   Musculoskeletal: Negative.  Negative for arthralgias, back pain, flank pain, gait problem and myalgias.  Skin: Negative.   Neurological: Negative.  Negative for dizziness, extremity weakness, gait problem, headaches, light-headedness, numbness,  seizures and speech difficulty.  Hematological: Negative.   Psychiatric/Behavioral: Negative.  Negative for depression and sleep disturbance. The patient is not nervous/anxious.   All other systems reviewed and are negative.     VITALS:  Blood pressure (!) 149/81, pulse 66, temperature 98.3 F (36.8 C), temperature source Oral, resp. rate 18, height _0  (1.778 m), weight 251 lb 9.6 oz (114.1 kg), SpO2 95 %.  Wt Readings from Last 3 Encounters:  11/26/21 251 lb 9.6 oz (114.1 kg)  11/14/21 252 lb (114.3 kg)  10/17/21 252 lb 1.3 oz (114.3 kg)    Body mass index is 36.1 kg/m.  Performance status (ECOG): 0 - Asymptomatic  PHYSICAL EXAM:  Physical Exam Constitutional:      General: He is not in acute distress.    Appearance: Normal appearance. He is normal weight.  HENT:     Head: Normocephalic and atraumatic.  Eyes:     General: No scleral icterus.    Extraocular Movements: Extraocular movements intact.     Conjunctiva/sclera: Conjunctivae normal.     Pupils: Pupils are equal, round, and reactive to light.  Cardiovascular:     Rate and Rhythm: Normal rate and regular rhythm.     Pulses: Normal pulses.     Heart sounds: Normal heart sounds. No murmur heard.    No friction rub. No gallop.  Pulmonary:     Effort: Pulmonary effort is normal. No respiratory distress.     Breath sounds: Normal breath sounds.  Abdominal:     General: Bowel sounds are normal. There is no distension.     Palpations: Abdomen is soft. There is hepatomegaly (7-8 cm below the right costal margin). There is no splenomegaly or mass.     Tenderness: There is no abdominal tenderness.  Musculoskeletal:        General: Normal range of motion.     Cervical back: Normal range of motion and neck supple.     Right lower leg: No edema.     Left lower leg: No edema.  Lymphadenopathy:     Cervical: No cervical adenopathy.  Skin:    General: Skin is warm and dry.  Neurological:     General: No focal deficit  present.     Mental Status: He is alert and oriented to person, place, and time. Mental status is at baseline.  Psychiatric:        Mood and Affect: Mood normal.        Behavior: Behavior normal.        Thought Content: Thought content normal.        Judgment: Judgment normal.      LABS:      Latest Ref Rng & Units 11/26/2021   12:00 AM 05/28/2021   12:00 AM 03/07/2021   12:00 AM  CBC  WBC  6.4     6.4  7.5   Hemoglobin 13.5 - 17.5 14.0     14.7  14.1  Hematocrit 41 - 53 41     44  41   Platelets 150 - 400 K/uL 154     153  136      This result is from an external source.      Latest Ref Rng & Units 11/26/2021   12:00 AM 05/28/2021   12:00 AM 03/07/2021   12:00 AM  CMP  BUN 4 - _0 Creatinine 0.6 - 1.3 1.6     1.7  1.7   Sodium 137 - 147 139     139  142   Potassium 3.5 - 5.1 mEq/L 3.8     4.0  4.0   Chloride 99 - 108 110     108  110   CO2 13 - _1 Calcium 8.7 - 10.7 8.5     8.4  8.5   Alkaline Phos 25 - 125 41     49  54   AST 14 - 40 34     39  37   ALT 10 - 40 U/L 36     59  47      This result is from an external source.   STUDIES:

## 2021-11-27 LAB — CHROMOGRANIN A: Chromogranin A (ng/mL): 151.7 ng/mL — ABNORMAL HIGH (ref 0.0–101.8)

## 2021-12-12 ENCOUNTER — Inpatient Hospital Stay: Payer: 59 | Attending: Oncology

## 2021-12-12 VITALS — BP 147/78 | HR 68 | Temp 97.8°F | Resp 18

## 2021-12-12 DIAGNOSIS — E340 Carcinoid syndrome: Secondary | ICD-10-CM | POA: Diagnosis not present

## 2021-12-12 DIAGNOSIS — C7A00 Malignant carcinoid tumor of unspecified site: Secondary | ICD-10-CM | POA: Diagnosis present

## 2021-12-12 DIAGNOSIS — C7B8 Other secondary neuroendocrine tumors: Secondary | ICD-10-CM

## 2021-12-12 DIAGNOSIS — C7B02 Secondary carcinoid tumors of liver: Secondary | ICD-10-CM | POA: Diagnosis present

## 2021-12-12 MED ORDER — OCTREOTIDE ACETATE 30 MG IM KIT
30.0000 mg | PACK | Freq: Once | INTRAMUSCULAR | Status: AC
  Administered 2021-12-12: 30 mg via INTRAMUSCULAR
  Filled 2021-12-12: qty 1

## 2021-12-12 NOTE — Patient Instructions (Signed)
Octreotide Injection Solution What is this medication? OCTREOTIDE (ok TREE oh tide) treats high levels of growth hormone (acromegaly). It works by reducing the amount of growth hormone your body makes. This reduces symptoms and the risk of health problems caused by too much growth hormone, such as diabetes and heart disease. It may also be used to treat diarrhea caused by neuroendocrine tumors. It works by slowing down the release of serotonin from the tumor cells. This reduces the number of bowel movements you have. This medicine may be used for other purposes; ask your health care provider or pharmacist if you have questions. COMMON BRAND NAME(S): Bynfezia, Sandostatin What should I tell my care team before I take this medication? They need to know if you have any of these conditions: Diabetes Gallbladder disease Kidney disease Liver disease Thyroid disease An unusual or allergic reaction to octreotide, other medications, foods, dyes, or preservatives Pregnant or trying to get pregnant Breast-feeding How should I use this medication? This medication is injected under the skin or into a vein. It is usually given by your care team in a hospital or clinic setting. If you get this medication at home, you will be taught how to prepare and give it. Use exactly as directed. Take it as directed on the prescription label at the same time every day. Keep taking it unless your care team tells you to stop. Allow the injection solution to come to room temperature before use. Do not warm it artificially. It is important that you put your used needles and syringes in a special sharps container. Do not put them in a trash can. If you do not have a sharps container, call your pharmacist or care team to get one. Talk to your care team about the use of this medication in children. Special care may be needed. Overdosage: If you think you have taken too much of this medicine contact a poison control center or  emergency room at once. NOTE: This medicine is only for you. Do not share this medicine with others. What if I miss a dose? If you miss a dose, take it as soon as you can. If it is almost time for your next dose, take only that dose. Do not take double or extra doses. What may interact with this medication? Bromocriptine Certain medications for blood pressure, heart disease, irregular heartbeat Cyclosporine Diuretics Medications for diabetes, including insulin Quinidine This list may not describe all possible interactions. Give your health care provider a list of all the medicines, herbs, non-prescription drugs, or dietary supplements you use. Also tell them if you smoke, drink alcohol, or use illegal drugs. Some items may interact with your medicine. What should I watch for while using this medication? Visit your care team for regular checks on your progress. Tell your care team if your symptoms do not start to get better or if they get worse. To help reduce irritation at the injection site, use a different site for each injection and make sure the solution is at room temperature before use. This medication may cause decreases in blood sugar. Signs of low blood sugar include chills, cool, pale skin or cold sweats, drowsiness, extreme hunger, fast heartbeat, headache, nausea, nervousness or anxiety, shakiness, trembling, unsteadiness, tiredness, or weakness. Contact your care team right away if you experience any of these symptoms. This medication may increase blood sugar. The risk may be higher in patients who already have diabetes. Ask your care team what you can do to lower your   risk of diabetes while taking this medication. You should make sure you get enough vitamin B12 while you are taking this medication. Discuss the foods you eat and the vitamins you take with your care team. What side effects may I notice from receiving this medication? Side effects that you should report to your care  team as soon as possible: Allergic reactions--skin rash, itching, hives, swelling of the face, lips, tongue, or throat Gallbladder problems--severe stomach pain, nausea, vomiting, fever Heart rhythm changes--fast or irregular heartbeat, dizziness, feeling faint or lightheaded, chest pain, trouble breathing High blood sugar (hyperglycemia)--increased thirst or amount of urine, unusual weakness or fatigue, blurry vision Low blood sugar (hypoglycemia)--tremors or shaking, anxiety, sweating, cold or clammy skin, confusion, dizziness, rapid heartbeat Low thyroid levels (hypothyroidism)--unusual weakness or fatigue, increased sensitivity to cold, constipation, hair loss, dry skin, weight gain, feelings of depression Low vitamin B12 level--pain, tingling, or numbness in the hands or feet, muscle weakness, dizziness, confusion, trouble concentrating Pancreatitis--severe stomach pain that spreads to your back or gets worse after eating or when touched, fever, nausea, vomiting Side effects that usually do not require medical attention (report to your care team if they continue or are bothersome): Diarrhea Dizziness Gas Headache Pain, redness, or irritation at injection site Stomach pain This list may not describe all possible side effects. Call your doctor for medical advice about side effects. You may report side effects to FDA at 1-800-FDA-1088. Where should I keep my medication? Keep out of the reach of children and pets. Store in the refrigerator. Protect from light. Allow to come to room temperature naturally. Do not use artificial heat. If protected from light, the injection may be stored between 20 and 30 degrees C (70 and 86 degrees F) for 14 days. After the initial use, throw away any unused portion of a multiple dose vial after 14 days. Get rid of any unused portions of the ampules after use. To get rid of medications that are no longer needed or have expired: Take the medication to a medication  take-back program. Ask your pharmacy or law enforcement to find a location. If you cannot return the medication, ask your pharmacist or care team how to get rid of the medication safely. NOTE: This sheet is a summary. It may not cover all possible information. If you have questions about this medicine, talk to your doctor, pharmacist, or health care provider.  2023 Elsevier/Gold Standard (2007-06-06 00:00:00)  

## 2021-12-22 ENCOUNTER — Encounter: Payer: Self-pay | Admitting: Oncology

## 2022-01-09 ENCOUNTER — Inpatient Hospital Stay: Payer: 59 | Attending: Oncology

## 2022-01-09 DIAGNOSIS — C7B02 Secondary carcinoid tumors of liver: Secondary | ICD-10-CM | POA: Diagnosis present

## 2022-01-09 DIAGNOSIS — C7A00 Malignant carcinoid tumor of unspecified site: Secondary | ICD-10-CM | POA: Insufficient documentation

## 2022-01-09 DIAGNOSIS — E340 Carcinoid syndrome: Secondary | ICD-10-CM | POA: Insufficient documentation

## 2022-01-09 DIAGNOSIS — C7B8 Other secondary neuroendocrine tumors: Secondary | ICD-10-CM

## 2022-01-09 MED ORDER — OCTREOTIDE ACETATE 30 MG IM KIT
30.0000 mg | PACK | Freq: Once | INTRAMUSCULAR | Status: AC
  Administered 2022-01-09: 30 mg via INTRAMUSCULAR

## 2022-01-09 NOTE — Patient Instructions (Signed)
Octreotide Injection Suspension What is this medication? OCTREOTIDE (ok TREE oh tide) treats high levels of growth hormone (acromegaly). It works by reducing the amount of growth hormone your body makes. This reduces symptoms and the risk of health problems caused by too much growth hormone, such as diabetes and heart disease. It may also be used to treat diarrhea caused by neuroendocrine tumors. It works by slowing down the release of serotonin from the tumor cells. This reduces the number of bowel movements you have. This medicine may be used for other purposes; ask your health care provider or pharmacist if you have questions. COMMON BRAND NAME(S): Sandostatin LAR What should I tell my care team before I take this medication? They need to know if you have any of these conditions: Diabetes Gallbladder disease Kidney disease Liver disease Thyroid disease An unusual or allergic reaction to octreotide, other medications, foods, dyes, or preservatives Pregnant or trying to get pregnant Breast-feeding How should I use this medication? This medication is injected into a muscle. It is usually given by your care team in a hospital or clinic setting. Talk to your care team about the use of this medication in children. Special care may be needed. Overdosage: If you think you have taken too much of this medicine contact a poison control center or emergency room at once. NOTE: This medicine is only for you. Do not share this medicine with others. What if I miss a dose? Keep appointments for follow-up doses. It is important not to miss your dose. Call your care team if you are unable to keep an appointment. What may interact with this medication? Do not take this medication with any of the following: Cisapride Dronedarone Flibanserin Lutetium Lu 177 dotatate Pimozide Saquinavir Thioridazine This medication may also interact with the following: Bromocriptine Certain medications for blood  pressure, heart disease, irregular heartbeat Cyclosporine Diuretics Medications for diabetes, including insulin Quinidine This list may not describe all possible interactions. Give your health care provider a list of all the medicines, herbs, non-prescription drugs, or dietary supplements you use. Also tell them if you smoke, drink alcohol, or use illegal drugs. Some items may interact with your medicine. What should I watch for while using this medication? Visit your care team for regular checks on your progress. Tell your care team if your symptoms do not start to get better or if they get worse. This medication may cause decreases in blood sugar. Signs of low blood sugar include chills, cool, pale skin or cold sweats, drowsiness, extreme hunger, fast heartbeat, headache, nausea, nervousness or anxiety, shakiness, trembling, unsteadiness, tiredness, or weakness. Contact your care team right away if you experience any of these symptoms. This medication may increase blood sugar. The risk may be higher in patients who already have diabetes. Ask your care team what you can do to lower your risk of diabetes while taking this medication. You should make sure you get enough vitamin B12 while you are taking this medication. Discuss the foods you eat and the vitamins you take with your care team. What side effects may I notice from receiving this medication? Side effects that you should report to your care team as soon as possible: Allergic reactions--skin rash, itching, hives, swelling of the face, lips, tongue, or throat Gallbladder problems--severe stomach pain, nausea, vomiting, fever Heart rhythm changes--fast or irregular heartbeat, dizziness, feeling faint or lightheaded, chest pain, trouble breathing High blood sugar (hyperglycemia)--increased thirst or amount of urine, unusual weakness or fatigue, blurry vision Low blood   sugar (hypoglycemia)--tremors or shaking, anxiety, sweating, cold or clammy  skin, confusion, dizziness, rapid heartbeat Low thyroid levels (hypothyroidism)--unusual weakness or fatigue, increased sensitivity to cold, constipation, hair loss, dry skin, weight gain, feelings of depression Low vitamin B12 level--pain, tingling, or numbness in the hands or feet, muscle weakness, dizziness, confusion, trouble concentrating Pancreatitis--severe stomach pain that spreads to your back or gets worse after eating or when touched, fever, nausea, vomiting Side effects that usually do not require medical attention (report to your care team if they continue or are bothersome): Diarrhea Dizziness Gas Headache Pain, redness, or irritation at injection site Stomach pain This list may not describe all possible side effects. Call your doctor for medical advice about side effects. You may report side effects to FDA at 1-800-FDA-1088. Where should I keep my medication? This medication is given in a hospital or clinic. It will not be stored at home. NOTE: This sheet is a summary. It may not cover all possible information. If you have questions about this medicine, talk to your doctor, pharmacist, or health care provider.  2023 Elsevier/Gold Standard (2021-06-15 00:00:00)  

## 2022-01-16 ENCOUNTER — Telehealth: Payer: Self-pay

## 2022-01-16 NOTE — Telephone Encounter (Signed)
Pt's wife, Shirlean Mylar, called to make [place] aware that Pharell is IP @ [facility]. "His small intestines are twisted. They are talking about surgery" I want to know what Dr Hinton Rao thinks. [phone]. I sent the above message to Dr Hinton Rao, Gabriel Rung and Angela,LPN @ [phone]

## 2022-01-17 ENCOUNTER — Encounter: Payer: Self-pay | Admitting: Oncology

## 2022-02-06 ENCOUNTER — Inpatient Hospital Stay: Payer: 59 | Attending: Oncology

## 2022-02-06 VITALS — BP 125/75 | HR 73 | Temp 98.0°F | Resp 18 | Wt 252.0 lb

## 2022-02-06 DIAGNOSIS — E340 Carcinoid syndrome: Secondary | ICD-10-CM | POA: Diagnosis present

## 2022-02-06 DIAGNOSIS — C7A00 Malignant carcinoid tumor of unspecified site: Secondary | ICD-10-CM | POA: Insufficient documentation

## 2022-02-06 DIAGNOSIS — C7B02 Secondary carcinoid tumors of liver: Secondary | ICD-10-CM | POA: Diagnosis present

## 2022-02-06 DIAGNOSIS — C7B8 Other secondary neuroendocrine tumors: Secondary | ICD-10-CM

## 2022-02-06 MED ORDER — OCTREOTIDE ACETATE 30 MG IM KIT
30.0000 mg | PACK | Freq: Once | INTRAMUSCULAR | Status: AC
  Administered 2022-02-06: 30 mg via INTRAMUSCULAR
  Filled 2022-02-06: qty 1

## 2022-02-06 NOTE — Patient Instructions (Signed)
Octreotide Injection Suspension What is this medication? OCTREOTIDE (ok TREE oh tide) treats high levels of growth hormone (acromegaly). It works by reducing the amount of growth hormone your body makes. This reduces symptoms and the risk of health problems caused by too much growth hormone, such as diabetes and heart disease. It may also be used to treat diarrhea caused by neuroendocrine tumors. It works by slowing down the release of serotonin from the tumor cells. This reduces the number of bowel movements you have. This medicine may be used for other purposes; ask your health care provider or pharmacist if you have questions. COMMON BRAND NAME(S): Sandostatin LAR What should I tell my care team before I take this medication? They need to know if you have any of these conditions: Diabetes Gallbladder disease Kidney disease Liver disease Thyroid disease An unusual or allergic reaction to octreotide, other medications, foods, dyes, or preservatives Pregnant or trying to get pregnant Breast-feeding How should I use this medication? This medication is injected into a muscle. It is usually given by your care team in a hospital or clinic setting. Talk to your care team about the use of this medication in children. Special care may be needed. Overdosage: If you think you have taken too much of this medicine contact a poison control center or emergency room at once. NOTE: This medicine is only for you. Do not share this medicine with others. What if I miss a dose? Keep appointments for follow-up doses. It is important not to miss your dose. Call your care team if you are unable to keep an appointment. What may interact with this medication? Do not take this medication with any of the following: Cisapride Dronedarone Flibanserin Lutetium Lu 177 dotatate Pimozide Saquinavir Thioridazine This medication may also interact with the following: Bromocriptine Certain medications for blood  pressure, heart disease, irregular heartbeat Cyclosporine Diuretics Medications for diabetes, including insulin Quinidine This list may not describe all possible interactions. Give your health care provider a list of all the medicines, herbs, non-prescription drugs, or dietary supplements you use. Also tell them if you smoke, drink alcohol, or use illegal drugs. Some items may interact with your medicine. What should I watch for while using this medication? Visit your care team for regular checks on your progress. Tell your care team if your symptoms do not start to get better or if they get worse. This medication may cause decreases in blood sugar. Signs of low blood sugar include chills, cool, pale skin or cold sweats, drowsiness, extreme hunger, fast heartbeat, headache, nausea, nervousness or anxiety, shakiness, trembling, unsteadiness, tiredness, or weakness. Contact your care team right away if you experience any of these symptoms. This medication may increase blood sugar. The risk may be higher in patients who already have diabetes. Ask your care team what you can do to lower your risk of diabetes while taking this medication. You should make sure you get enough vitamin B12 while you are taking this medication. Discuss the foods you eat and the vitamins you take with your care team. What side effects may I notice from receiving this medication? Side effects that you should report to your care team as soon as possible: Allergic reactions--skin rash, itching, hives, swelling of the face, lips, tongue, or throat Gallbladder problems--severe stomach pain, nausea, vomiting, fever Heart rhythm changes--fast or irregular heartbeat, dizziness, feeling faint or lightheaded, chest pain, trouble breathing High blood sugar (hyperglycemia)--increased thirst or amount of urine, unusual weakness or fatigue, blurry vision Low blood   sugar (hypoglycemia)--tremors or shaking, anxiety, sweating, cold or clammy  skin, confusion, dizziness, rapid heartbeat Low thyroid levels (hypothyroidism)--unusual weakness or fatigue, increased sensitivity to cold, constipation, hair loss, dry skin, weight gain, feelings of depression Low vitamin B12 level--pain, tingling, or numbness in the hands or feet, muscle weakness, dizziness, confusion, trouble concentrating Pancreatitis--severe stomach pain that spreads to your back or gets worse after eating or when touched, fever, nausea, vomiting Side effects that usually do not require medical attention (report to your care team if they continue or are bothersome): Diarrhea Dizziness Gas Headache Pain, redness, or irritation at injection site Stomach pain This list may not describe all possible side effects. Call your doctor for medical advice about side effects. You may report side effects to FDA at 1-800-FDA-1088. Where should I keep my medication? This medication is given in a hospital or clinic. It will not be stored at home. NOTE: This sheet is a summary. It may not cover all possible information. If you have questions about this medicine, talk to your doctor, pharmacist, or health care provider.  2023 Elsevier/Gold Standard (2021-06-15 00:00:00)  

## 2022-02-24 NOTE — Progress Notes (Signed)
Glenn Bender  [address] [place],  [place]  [address] [phone]  Clinic Day: 02/25/22  Referring physician: Carvel Getting Key, *  ASSESSMENT & PLAN:   1. 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  He does have a terminal ileum mass measuring 4.0 x 2.8 x 3.7 cm, and a mass of the mesentery of the small intestine measuring 3.9 x 2.3 x 4.0 cm.  As this has already metastasized, surgical resection is not indicated.  24 hour urine for 5 HIAA was quite elevated at 86.8, confirming carcinoid syndrome.  Chromogranin A was also elevated at 214. He started treatment with octreotide injections in October 2021 and continues to tolerate this without difficulty.  Due to the persistent diarrhea, we increased the octreotide to 30 mg every 4 weeks. CT imaging in August 2022 was stable. Chromogranin A from November of 2022 was down to 118.6, but has been increasing this year.  CT scan from April 2023 remains stable.   2. Multiple liver masses, the largest measuring 4.0 x 4.3 cm, consistent with metastatic carcinoid with carcinoid syndrome, which were stable on CT imaging.     3. History of testicular cancer diagnosed in 1996, treated with orchiectomy and chemotherapy.    4. Diarrhea, secondary to carcinoid malignancy.  This has improved with increasing octreotide to 30 mg.   5.  Chronic kidney disease, stable.  He denies being on any other medications and has not been taking NSAIDs.  I advised that he avoid ibuprofen and Aleve. He knows to push fluids.   6.  Partial bowel obstruction due to mesenteric mass.  This resolved on its own without requiring surgery.  7.  Renal lithiasis.  He has microscopic hematuria and does have a history of kidney stones with the last one passed in May.  8.  Suspicion of sleep apnea, he will be taking a sleep study.   He will continue octreotide 30 mg monthly and will proceed with his next  dose on 11/8.  We will plan to see him back in 5 weeks with a CBC and comprehensive metabolic panel, with Sandostatin injection 2 days later.  The patient understands the plans discussed today and is in agreement with them.  He knows to contact our office if he develops concerns prior to his next appointment.  I provided 15 minutes of face-to-face time during this this encounter and > 50% was spent counseling as documented under my assessment and plan.    Glenn Kaplan, MD [facility] [address] [place] [address] Dept: [phone] Dept Fax: [phone]    CHIEF COMPLAINT:  CC: Metastatic carcinoid to liver  Current Treatment:  Octreotide 30 mg IM monthly   HISTORY OF PRESENT ILLNESS:  Glenn Bender is a 61 y.o. male with metastatic carcinoid to liver diagnosed in September 2021.  The patient presented in September with hematuria, felt to be due to a kidney stone.  CT abdomen and pelvis revealed multiple liver masses, the largest measuring 4.0 x 4.3 cm, a terminal ileum mass measuring 4.0 x 2.8 x 3.7 cm, and a mass of the mesentery of the small intestine measuring 3.9 x 2.3 x 4.0 cm.  Diagnostic colonoscopy with Dr. Coralie Keens in September revealed a large mucosal covered mass protruding through the ileocecal valve with peristalsis.  Biopsy was collected and surgical pathology was benign.  Liver biopsy revealed metastatic neuroendocrine tumor, grade 2,  consistent with gastrointestinal primary. Synaptophysin and chromogranin-A immunostains were positive as well as CDX-2 supporting gastrointestinal origin.  Ki67 was 6%.  He reports flushing of his face with alcohol and certain foods.  He reported diarrhea, as well as flushing.  24 hour urine for 5HIAA was also elevated at 86.8, which is consistent with carcinoid.  Chromogranin A was elevated at 214 in October and came down to 116.9 in December.  CT chest from November 2021  revealed left paratracheal adenopathy and pleural nodularity along the right hemidiaphragm measuring up to 12 mm, worrisome for metastatic disease. As he had metastatic disease, resection of his primary was not recommended. The patient was started on octreotide 20 mg IM monthly in October.  Octreotide was subsequently increased to 30 mg monthly in December due to persistent diarrhea.   CT chest, abdomen and pelvis from February 2022 revealed mostly stable to slightly decreased appearance of the carcinoid tumor, including the mesenteric lesion with cicatricial reaction, adjacent wall.  There was increased nodularity in a bandlike fashion along the mesenteric vessels adjacent to the carcinoid tumor. The liver lesions were stable with no new lesions identified. There was stable nodularity/lobularity along the right hemidiaphragm.  He has had a continued decrease in the Chromogranin A, which was 111.4 in February, and then 100.3 in March, which is within normal limits.  He had improvement in his diarrhea with the increase in octreotide. The chromogranin A was up slightly in May to 126. CT chest, abdomen and pelvis from August revealed no new or progressive metastatic disease.  Foci of masslike wall thickening within a central right abdominal small bowel loop and at the terminal ileum are stable. Calcified central mesenteric adenopathy is stable. Findings are suggestive of multifocal small bowel carcinoid with conglomerate mesenteric nodal metastases.  Widespread hyperenhancing liver metastases are stable.  Stable clustered subpleural solid pulmonary nodules along the right hemidiaphragm in the basilar right lower lobe are compatible with pulmonary metastases.  There is new background diffuse hepatic steatosis. Chromogranin A from August of 2022 was down to 118.6.  INTERVAL HISTORY:  I have reviewed his chart and materials related to his cancer extensively and collaborated history with the patient. Summary of  oncologic history is as follows: Oncology History  Malignant carcinoid tumor of unknown primary site [facility])  02/11/2020 Initial Diagnosis   Malignant carcinoid tumor of unknown primary site [facility])     [name] is here for routine follow up prior to his next octreotide.  He was recently hospitalized for small bowel obstruction and they were considering surgery.  The surgeon contacted me and recommended that he resect the mesenteric mass.  However Lavoy improved on his own and so no surgery was required.  He is being scheduled for a sleep study.  He was found to have microscopic hematuria but tells me his last kidney stone was passed in May.  Blood counts are unremarkable. Chromogranin A from November 2022 was down to 118.6, but then up to 151.7 in July.  This was repeated today.Marland Kitchen His  appetite is good, and he has lost 3 pounds since his last visit.  He denies fever, chills or other signs of infection.  He denies nausea, vomiting, bowel issues, or abdominal pain.  He denies sore throat, cough, dyspnea, or chest pain.  HISTORY:   Allergies: No Known Allergies  Current Medications: Current Outpatient Medications  Medication Sig Dispense Refill   octreotide (SANDOSTATIN LAR) 20 MG injection Inject 20 mg into the muscle every 28 (twenty-eight)  days.     No current facility-administered medications for this visit.    REVIEW OF SYSTEMS:  Review of Systems  Constitutional: Negative.  Negative for appetite change, chills, fatigue, fever and unexpected weight change.  HENT:  Negative.    Eyes: Negative.   Respiratory: Negative.  Negative for chest tightness, cough, hemoptysis, shortness of breath and wheezing.   Cardiovascular: Negative.  Negative for chest pain, leg swelling and palpitations.  Gastrointestinal: Negative.  Negative for abdominal distention, abdominal pain, blood in stool, constipation, diarrhea, nausea and vomiting.  Endocrine: Negative.   Genitourinary: Negative.  Negative for  difficulty urinating, dysuria, frequency and hematuria.   Musculoskeletal: Negative.  Negative for arthralgias, back pain, flank pain, gait problem and myalgias.  Skin: Negative.   Neurological: Negative.  Negative for dizziness, extremity weakness, gait problem, headaches, light-headedness, numbness, seizures and speech difficulty.  Hematological: Negative.   Psychiatric/Behavioral: Negative.  Negative for depression and sleep disturbance. The patient is not nervous/anxious.   All other systems reviewed and are negative.     VITALS:  Blood pressure (!) 148/80, pulse (!) 54, temperature 97.8 F (36.6 C), temperature source Oral, resp. rate 18, height _0  (1.778 m), weight 248 lb 12.8 oz (112.9 kg), SpO2 98 %.  Wt Readings from Last 3 Encounters:  03/06/22 249 lb (112.9 kg)  02/25/22 248 lb 12.8 oz (112.9 kg)  02/06/22 252 lb (114.3 kg)    Body mass index is 35.7 kg/m.  Performance status (ECOG): 0 - Asymptomatic  PHYSICAL EXAM:  Physical Exam Constitutional:      General: He is not in acute distress.    Appearance: Normal appearance. He is normal weight.  HENT:     Head: Normocephalic and atraumatic.  Eyes:     General: No scleral icterus.    Extraocular Movements: Extraocular movements intact.     Conjunctiva/sclera: Conjunctivae normal.     Pupils: Pupils are equal, round, and reactive to light.  Cardiovascular:     Rate and Rhythm: Normal rate and regular rhythm.     Pulses: Normal pulses.     Heart sounds: Normal heart sounds. No murmur heard.    No friction rub. No gallop.  Pulmonary:     Effort: Pulmonary effort is normal. No respiratory distress.     Breath sounds: Normal breath sounds.  Abdominal:     General: Bowel sounds are normal. There is no distension.     Palpations: Abdomen is soft. There is hepatomegaly (7-8 cm below the right costal margin). There is no splenomegaly or mass.     Tenderness: There is no abdominal tenderness.  Musculoskeletal:         General: Normal range of motion.     Cervical back: Normal range of motion and neck supple.     Right lower leg: No edema.     Left lower leg: No edema.  Lymphadenopathy:     Cervical: No cervical adenopathy.  Skin:    General: Skin is warm and dry.  Neurological:     General: No focal deficit present.     Mental Status: He is alert and oriented to person, place, and time. Mental status is at baseline.  Psychiatric:        Mood and Affect: Mood normal.        Behavior: Behavior normal.        Thought Content: Thought content normal.        Judgment: Judgment normal.      LABS:  Latest Ref Rng & Units 02/25/2022   12:00 AM 11/26/2021   12:00 AM 05/28/2021   12:00 AM  CBC  WBC  6.0     6.4     6.4   Hemoglobin 13.5 - 17.5 13.7     14.0     14.7   Hematocrit 41 - 53 41     41     44   Platelets 150 - 400 K/uL 147     154     153      This result is from an external source.      Latest Ref Rng & Units 11/26/2021   12:00 AM 05/28/2021   12:00 AM 03/07/2021   12:00 AM  CMP  BUN 4 - _0 Creatinine 0.6 - 1.3 1.6     1.7  1.7   Sodium 137 - 147 139     139  142   Potassium 3.5 - 5.1 mEq/L 3.8     4.0  4.0   Chloride 99 - 108 110     108  110   CO2 13 - _1 Calcium 8.7 - 10.7 8.5     8.4  8.5   Alkaline Phos 25 - 125 41     49  54   AST 14 - 40 34     39  37   ALT 10 - 40 U/L 36     59  47      This result is from an external source.   STUDIES:

## 2022-02-25 ENCOUNTER — Inpatient Hospital Stay (INDEPENDENT_AMBULATORY_CARE_PROVIDER_SITE_OTHER): Payer: 59 | Admitting: Oncology

## 2022-02-25 ENCOUNTER — Encounter: Payer: Self-pay | Admitting: Oncology

## 2022-02-25 ENCOUNTER — Inpatient Hospital Stay: Payer: 59

## 2022-02-25 VITALS — BP 148/80 | HR 54 | Temp 97.8°F | Resp 18 | Ht 70.0 in | Wt 248.8 lb

## 2022-02-25 DIAGNOSIS — C7B8 Other secondary neuroendocrine tumors: Secondary | ICD-10-CM | POA: Diagnosis not present

## 2022-02-25 DIAGNOSIS — I82401 Acute embolism and thrombosis of unspecified deep veins of right lower extremity: Secondary | ICD-10-CM | POA: Diagnosis not present

## 2022-02-25 DIAGNOSIS — E340 Carcinoid syndrome: Secondary | ICD-10-CM | POA: Diagnosis not present

## 2022-02-25 DIAGNOSIS — C7A00 Malignant carcinoid tumor of unspecified site: Secondary | ICD-10-CM

## 2022-02-25 LAB — BASIC METABOLIC PANEL
BUN: 17 (ref 4–21)
CO2: 25 — AB (ref 13–22)
Chloride: 106 (ref 99–108)
Creatinine: 1.7 — AB (ref 0.6–1.3)
Glucose: 158
Potassium: 4.3 mEq/L (ref 3.5–5.1)
Sodium: 135 — AB (ref 137–147)

## 2022-02-25 LAB — HEPATIC FUNCTION PANEL
ALT: 49 U/L — AB (ref 10–40)
AST: 35 (ref 14–40)
Alkaline Phosphatase: 48 (ref 25–125)
Bilirubin, Total: 0.7

## 2022-02-25 LAB — COMPREHENSIVE METABOLIC PANEL
Albumin: 3.9 (ref 3.5–5.0)
Calcium: 8.7 (ref 8.7–10.7)

## 2022-02-25 LAB — CBC AND DIFFERENTIAL
HCT: 41 (ref 41–53)
Hemoglobin: 13.7 (ref 13.5–17.5)
Neutrophils Absolute: 4.38
Platelets: 147 10*3/uL — AB (ref 150–400)
WBC: 6

## 2022-02-25 LAB — CBC: RBC: 4.66 (ref 3.87–5.11)

## 2022-02-26 LAB — CHROMOGRANIN A: Chromogranin A (ng/mL): 168.9 ng/mL — ABNORMAL HIGH (ref 0.0–101.8)

## 2022-03-06 ENCOUNTER — Inpatient Hospital Stay: Payer: 59 | Attending: Oncology

## 2022-03-06 VITALS — BP 133/80 | HR 68 | Temp 97.9°F | Resp 20 | Ht 70.0 in | Wt 249.0 lb

## 2022-03-06 DIAGNOSIS — C7A00 Malignant carcinoid tumor of unspecified site: Secondary | ICD-10-CM | POA: Insufficient documentation

## 2022-03-06 DIAGNOSIS — C7B02 Secondary carcinoid tumors of liver: Secondary | ICD-10-CM | POA: Diagnosis present

## 2022-03-06 DIAGNOSIS — E340 Carcinoid syndrome: Secondary | ICD-10-CM | POA: Insufficient documentation

## 2022-03-06 DIAGNOSIS — C7B8 Other secondary neuroendocrine tumors: Secondary | ICD-10-CM

## 2022-03-06 MED ORDER — OCTREOTIDE ACETATE 30 MG IM KIT
30.0000 mg | PACK | Freq: Once | INTRAMUSCULAR | Status: AC
  Administered 2022-03-06: 30 mg via INTRAMUSCULAR
  Filled 2022-03-06: qty 1

## 2022-03-06 NOTE — Patient Instructions (Signed)
Octreotide Injection Solution What is this medication? OCTREOTIDE (ok TREE oh tide) treats high levels of growth hormone (acromegaly). It works by reducing the amount of growth hormone your body makes. This reduces symptoms and the risk of health problems caused by too much growth hormone, such as diabetes and heart disease. It may also be used to treat diarrhea caused by neuroendocrine tumors. It works by slowing down the release of serotonin from the tumor cells. This reduces the number of bowel movements you have. This medicine may be used for other purposes; ask your health care provider or pharmacist if you have questions. COMMON BRAND NAME(S): Bynfezia, Sandostatin What should I tell my care team before I take this medication? They need to know if you have any of these conditions: Diabetes Gallbladder disease Kidney disease Liver disease Thyroid disease An unusual or allergic reaction to octreotide, other medications, foods, dyes, or preservatives Pregnant or trying to get pregnant Breast-feeding How should I use this medication? This medication is injected under the skin or into a vein. It is usually given by your care team in a hospital or clinic setting. If you get this medication at home, you will be taught how to prepare and give it. Use exactly as directed. Take it as directed on the prescription label at the same time every day. Keep taking it unless your care team tells you to stop. Allow the injection solution to come to room temperature before use. Do not warm it artificially. It is important that you put your used needles and syringes in a special sharps container. Do not put them in a trash can. If you do not have a sharps container, call your pharmacist or care team to get one. Talk to your care team about the use of this medication in children. Special care may be needed. Overdosage: If you think you have taken too much of this medicine contact a poison control center or  emergency room at once. NOTE: This medicine is only for you. Do not share this medicine with others. What if I miss a dose? If you miss a dose, take it as soon as you can. If it is almost time for your next dose, take only that dose. Do not take double or extra doses. What may interact with this medication? Bromocriptine Certain medications for blood pressure, heart disease, irregular heartbeat Cyclosporine Diuretics Medications for diabetes, including insulin Quinidine This list may not describe all possible interactions. Give your health care provider a list of all the medicines, herbs, non-prescription drugs, or dietary supplements you use. Also tell them if you smoke, drink alcohol, or use illegal drugs. Some items may interact with your medicine. What should I watch for while using this medication? Visit your care team for regular checks on your progress. Tell your care team if your symptoms do not start to get better or if they get worse. To help reduce irritation at the injection site, use a different site for each injection and make sure the solution is at room temperature before use. This medication may cause decreases in blood sugar. Signs of low blood sugar include chills, cool, pale skin or cold sweats, drowsiness, extreme hunger, fast heartbeat, headache, nausea, nervousness or anxiety, shakiness, trembling, unsteadiness, tiredness, or weakness. Contact your care team right away if you experience any of these symptoms. This medication may increase blood sugar. The risk may be higher in patients who already have diabetes. Ask your care team what you can do to lower your   risk of diabetes while taking this medication. You should make sure you get enough vitamin B12 while you are taking this medication. Discuss the foods you eat and the vitamins you take with your care team. What side effects may I notice from receiving this medication? Side effects that you should report to your care  team as soon as possible: Allergic reactions--skin rash, itching, hives, swelling of the face, lips, tongue, or throat Gallbladder problems--severe stomach pain, nausea, vomiting, fever Heart rhythm changes--fast or irregular heartbeat, dizziness, feeling faint or lightheaded, chest pain, trouble breathing High blood sugar (hyperglycemia)--increased thirst or amount of urine, unusual weakness or fatigue, blurry vision Low blood sugar (hypoglycemia)--tremors or shaking, anxiety, sweating, cold or clammy skin, confusion, dizziness, rapid heartbeat Low thyroid levels (hypothyroidism)--unusual weakness or fatigue, increased sensitivity to cold, constipation, hair loss, dry skin, weight gain, feelings of depression Low vitamin B12 level--pain, tingling, or numbness in the hands or feet, muscle weakness, dizziness, confusion, trouble concentrating Pancreatitis--severe stomach pain that spreads to your back or gets worse after eating or when touched, fever, nausea, vomiting Side effects that usually do not require medical attention (report to your care team if they continue or are bothersome): Diarrhea Dizziness Gas Headache Pain, redness, or irritation at injection site Stomach pain This list may not describe all possible side effects. Call your doctor for medical advice about side effects. You may report side effects to FDA at 1-800-FDA-1088. Where should I keep my medication? Keep out of the reach of children and pets. Store in the refrigerator. Protect from light. Allow to come to room temperature naturally. Do not use artificial heat. If protected from light, the injection may be stored between 20 and 30 degrees C (70 and 86 degrees F) for 14 days. After the initial use, throw away any unused portion of a multiple dose vial after 14 days. Get rid of any unused portions of the ampules after use. To get rid of medications that are no longer needed or have expired: Take the medication to a medication  take-back program. Ask your pharmacy or law enforcement to find a location. If you cannot return the medication, ask your pharmacist or care team how to get rid of the medication safely. NOTE: This sheet is a summary. It may not cover all possible information. If you have questions about this medicine, talk to your doctor, pharmacist, or health care provider.  2023 Elsevier/Gold Standard (2021-07-18 00:00:00)  

## 2022-03-13 ENCOUNTER — Encounter: Payer: Self-pay | Admitting: Oncology

## 2022-04-01 ENCOUNTER — Other Ambulatory Visit: Payer: 59

## 2022-04-01 ENCOUNTER — Ambulatory Visit: Payer: 59 | Admitting: Oncology

## 2022-04-02 ENCOUNTER — Inpatient Hospital Stay: Payer: 59 | Attending: Oncology

## 2022-04-02 ENCOUNTER — Other Ambulatory Visit: Payer: Self-pay | Admitting: Hematology and Oncology

## 2022-04-02 ENCOUNTER — Encounter: Payer: Self-pay | Admitting: Hematology and Oncology

## 2022-04-02 ENCOUNTER — Inpatient Hospital Stay (INDEPENDENT_AMBULATORY_CARE_PROVIDER_SITE_OTHER): Payer: 59 | Admitting: Hematology and Oncology

## 2022-04-02 VITALS — BP 152/103 | HR 76 | Temp 98.5°F | Resp 18 | Ht 70.0 in | Wt 246.6 lb

## 2022-04-02 DIAGNOSIS — C7A00 Malignant carcinoid tumor of unspecified site: Secondary | ICD-10-CM | POA: Insufficient documentation

## 2022-04-02 DIAGNOSIS — C7B02 Secondary carcinoid tumors of liver: Secondary | ICD-10-CM | POA: Insufficient documentation

## 2022-04-02 DIAGNOSIS — C7B8 Other secondary neuroendocrine tumors: Secondary | ICD-10-CM

## 2022-04-02 DIAGNOSIS — E340 Carcinoid syndrome: Secondary | ICD-10-CM | POA: Insufficient documentation

## 2022-04-02 LAB — CBC WITH DIFFERENTIAL (CANCER CENTER ONLY)
Abs Immature Granulocytes: 0.04 10*3/uL (ref 0.00–0.07)
Basophils Absolute: 0.1 10*3/uL (ref 0.0–0.1)
Basophils Relative: 1 %
Eosinophils Absolute: 0.1 10*3/uL (ref 0.0–0.5)
Eosinophils Relative: 2 %
HCT: 43.2 % (ref 39.0–52.0)
Hemoglobin: 14.3 g/dL (ref 13.0–17.0)
Immature Granulocytes: 1 %
Lymphocytes Relative: 16 %
Lymphs Abs: 1.1 10*3/uL (ref 0.7–4.0)
MCH: 29.2 pg (ref 26.0–34.0)
MCHC: 33.1 g/dL (ref 30.0–36.0)
MCV: 88.3 fL (ref 80.0–100.0)
Monocytes Absolute: 0.4 10*3/uL (ref 0.1–1.0)
Monocytes Relative: 5 %
Neutro Abs: 5.3 10*3/uL (ref 1.7–7.7)
Neutrophils Relative %: 75 %
Platelet Count: 162 10*3/uL (ref 150–400)
RBC: 4.89 MIL/uL (ref 4.22–5.81)
RDW: 13.7 % (ref 11.5–15.5)
WBC Count: 7.1 10*3/uL (ref 4.0–10.5)
nRBC: 0 % (ref 0.0–0.2)

## 2022-04-02 LAB — CMP (CANCER CENTER ONLY)
ALT: 43 U/L (ref 0–44)
AST: 31 U/L (ref 15–41)
Albumin: 4.1 g/dL (ref 3.5–5.0)
Alkaline Phosphatase: 60 U/L (ref 38–126)
Anion gap: 7 (ref 5–15)
BUN: 18 mg/dL (ref 8–23)
CO2: 24 mmol/L (ref 22–32)
Calcium: 8.8 mg/dL — ABNORMAL LOW (ref 8.9–10.3)
Chloride: 111 mmol/L (ref 98–111)
Creatinine: 1.57 mg/dL — ABNORMAL HIGH (ref 0.61–1.24)
GFR, Estimated: 49 mL/min — ABNORMAL LOW (ref >60)
Glucose, Bld: 97 mg/dL (ref 70–99)
Potassium: 4.1 mmol/L (ref 3.5–5.1)
Sodium: 142 mmol/L (ref 135–145)
Total Bilirubin: 1 mg/dL (ref 0.3–1.2)
Total Protein: 6.7 g/dL (ref 6.5–8.1)

## 2022-04-02 NOTE — Assessment & Plan Note (Signed)
Multiple liver masses, the largest measuring 4.0 x 4.3 cm, consistent with metastatic carcinoid with carcinoid syndrome, which were stable on CT imaging in September.

## 2022-04-02 NOTE — Assessment & Plan Note (Addendum)
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  He does have a terminal ileum mass measuring 4.0 x 2.8 x 3.7 cm, and a mass of the mesentery of the small intestine measuring 3.9 x 2.3 x 4.0 cm.  As this has already metastasized to the liver, surgical resection was not indicated.  24 hour urine for 5 HIAA was quite elevated at 86.8 consistent with carcinoid syndrome.  Chromogranin A was also elevated at 214. He started treatment with octreotide injections every 4 weeks in October 2021.  Due to the persistent diarrhea, we increased the octreotide to 30 mg every 4 weeks. CT imaging in August 2022 was stable.  The chromogranin A from November 2022 was down to 118.6, but has been increasing this year.  CT scan in April 2023 remained stable.  The chromogranin A was 152 in July.  The patient was hospitalized in September. CT abdomen pelvis at that time revealed small-bowel obstruction, with transition point in the mid to distal jejunum adjacent to the known mesenteric carcinoid tumor. Stable, 3.9 cm, calcified central mesenteric mass, consistent with carcinoid tumor. Numerous liver masses, consistent with metastatic carcinoid tumor. Index lesions not appreciably changed since prior exam. The chromogranin A was 169 in October.  He will proceed with octreotide tomorrow as scheduled.  Will plan to see him back in 4 weeks with a CBC, comprehensive metabolic panel and chromogranin A for repeat clinical assessment.

## 2022-04-02 NOTE — Progress Notes (Signed)
[facility]  [address] [place],  [place]  [address] [phone]  Clinic Day:  04/02/2022  Referring physician: Carvel Getting Key, *  ASSESSMENT & PLAN:   Assessment & Plan: Malignant carcinoid tumor of unknown primary site [facility]) Metastatic carcinoid tumor consistent with gastrointestinal origin in September 2021.  He does have a terminal ileum mass measuring 4.0 x 2.8 x 3.7 cm, and a mass of the mesentery of the small intestine measuring 3.9 x 2.3 x 4.0 cm.  As this has already metastasized to the liver, surgical resection was not indicated.  24 hour urine for 5 HIAA was quite elevated at 86.8 consistent with carcinoid syndrome.  Chromogranin A was also elevated at 214. He started treatment with octreotide injections every 4 weeks in October 2021.  Due to the persistent diarrhea, we increased the octreotide to 30 mg every 4 weeks. CT imaging in August 2022 was stable.  The chromogranin A from November 2022 was down to 118.6, but has been increasing this year.  CT scan in April 2023 remained stable.  The chromogranin A was 152 in July.  The patient was hospitalized in September. CT abdomen pelvis at that time revealed small-bowel obstruction, with transition point in the mid to distal jejunum adjacent to the known mesenteric carcinoid tumor. Stable, 3.9 cm, calcified central mesenteric mass, consistent with carcinoid tumor. Numerous liver masses, consistent with metastatic carcinoid tumor. Index lesions not appreciably changed since prior exam. The chromogranin A was 169 in October.  He will proceed with octreotide tomorrow as scheduled.  Chromogranin A is pending from today.  We will plan to see him back in 4 weeks with a CBC, comprehensive metabolic panel and chromogranin A for repeat clinical assessment.  Secondary neuroendocrine tumor of liver ([place]) Multiple liver masses, the largest measuring 4.0 x 4.3 cm, consistent with metastatic carcinoid with  carcinoid syndrome, which were stable on CT imaging in September.      The patient understands the plans discussed today and is in agreement with them.  He knows to contact our office if he develops concerns prior to his next appointment.   I provided 10 minutes of face-to-face time during this encounter and > 50% was spent counseling as documented under my assessment and plan.    Marvia Pickles, PA-C  [facility] [address] [place] [address] Dept: [phone] Dept Fax: [phone]   No orders of the defined types were placed in this encounter.     CHIEF COMPLAINT:  CC: Metastatic carcinoid  Current Treatment: Octreotide 30 mg every 4 weeks  HISTORY OF PRESENT ILLNESS:  Glenn Bender is a 61 y.o. male with metastatic carcinoid to liver diagnosed in September 2021.  The patient presented in September with hematuria, felt to be due to a kidney stone.  CT abdomen and pelvis revealed multiple liver masses, the largest measuring 4.0 x 4.3 cm, a terminal ileum mass measuring 4.0 x 2.8 x 3.7 cm, and a mass of the mesentery of the small intestine measuring 3.9 x 2.3 x 4.0 cm.  Diagnostic colonoscopy with Dr. Coralie Keens in September revealed a large mucosal covered mass protruding through the ileocecal valve with peristalsis.  Biopsy was collected and surgical pathology was benign.  Liver biopsy revealed metastatic neuroendocrine tumor, grade 2, consistent with gastrointestinal primary. Synaptophysin and chromogranin-A immunostains were positive as well as CDX-2 supporting gastrointestinal origin.  Ki67 was 6%.  He reports flushing  of his face with alcohol and certain foods.  He reported diarrhea, as well as flushing.  24 hour urine for 5HIAA was also elevated at 86.8, which is consistent with carcinoid.  Chromogranin A was elevated at 214 in October and came down to 116.9 in December.  CT chest from November 2021 revealed left  paratracheal adenopathy and pleural nodularity along the right hemidiaphragm measuring up to 12 mm, worrisome for metastatic disease. As he had metastatic disease, resection of his primary was not recommended. The patient was started on octreotide 20 mg IM monthly in October.  Octreotide was subsequently increased to 30 mg monthly in December due to persistent diarrhea.   CT chest, abdomen and pelvis in February 2022 revealed mostly stable to slightly decreased appearance of the carcinoid tumor, including the mesenteric lesion with cicatricial reaction, adjacent wall.  There was increased nodularity in a bandlike fashion along the mesenteric vessels adjacent to the carcinoid tumor. The liver lesions were stable with no new lesions identified. There was stable nodularity/lobularity along the right hemidiaphragm.  He has had a continued decrease in the Chromogranin A, which was 111.4 in February, and then 100.3 in March, which is within normal limits.  He had improvement in his diarrhea with the increase in octreotide. The chromogranin A was up slightly in May to 126. CT chest, abdomen and pelvis in August revealed no new or progressive metastatic disease.  Foci of masslike wall thickening within a central right abdominal small bowel loop and at the terminal ileum are stable. Calcified central mesenteric adenopathy is stable. Findings are suggestive of multifocal small bowel carcinoid with conglomerate mesenteric nodal metastases.  Widespread hyperenhancing liver metastases are stable.  Stable clustered subpleural solid pulmonary nodules along the right hemidiaphragm in the basilar right lower lobe are compatible with pulmonary metastases.  There is new background diffuse hepatic steatosis. Chromogranin A was down to 107.5 in August.  The chromogranin A was 118 in November.  CT abdomen and pelvis in April 2023 revealed stable hepatic metastatic disease and stable hepatic steatosis.  There was a stable 3.6 cm  partially calcified mesenteric mass, consistent with carcinoid tumor.  No evidence of new or progressive metastatic disease.  Bilateral nephrolithiasis, a small left inguinal hernia and aortic atherosclerosis were also seen.  He was hospitalized in September at [facility] with a small bowel obstruction.  CT abdomen and pelvis at that time revealed small-bowel obstruction, with transition point in the mid to distal jejunum adjacent to the known mesenteric carcinoid tumor. Stable calcified central mesenteric mass consistent with carcinoid tumor. Numerous liver masses, consistent with metastatic carcinoid tumor. Index lesions not appreciably changed since prior exam. Hepatic steatosis, a non-obstructing left renal calculus and aortic atherosclerosis was seen.  We have continued octreotide 30 mg every 4 weeks with good control of his diarrhea.  INTERVAL HISTORY:  Glenn Bender is here today for repeat clinical assessment and states he has been doing fairly well.  He states he was diagnosed with sleep apnea and is waiting for his machine to be delivered.  He states he will have another sleep test with the machine in place.  He denies nausea, vomiting, diarrhea, constipation or abdominal pain. He reports symptoms of sinus infection with greenish nasal drainage and occasional dry cough, which is improving.  He denies sore throat, shortness of breath or chest pain.  He denies fevers or chills. He denies pain. His appetite is good. His weight has decreased 2 pounds over last month .  REVIEW OF  SYSTEMS:  Review of Systems  Constitutional:  Negative for appetite change, chills, fatigue, fever and unexpected weight change.  HENT:   Negative for lump/mass, mouth sores and sore throat.   Respiratory:  Positive for cough. Negative for shortness of breath.   Cardiovascular:  Negative for chest pain and leg swelling.  Gastrointestinal:  Negative for abdominal pain, constipation, diarrhea, nausea and vomiting.  Genitourinary:   Negative for difficulty urinating, dysuria, frequency and hematuria.   Musculoskeletal:  Negative for arthralgias, back pain and myalgias.  Skin:  Negative for itching, rash and wound.  Neurological:  Negative for dizziness, extremity weakness, headaches, light-headedness and numbness.  Hematological:  Negative for adenopathy.  Psychiatric/Behavioral:  Negative for depression and sleep disturbance. The patient is not nervous/anxious.      VITALS:  Blood pressure (!) 152/103, pulse 76, temperature 98.5 F (36.9 C), temperature source Oral, resp. rate 18, height _0  (1.778 m), weight 246 lb 9.6 oz (111.9 kg), SpO2 97 %.  Wt Readings from Last 3 Encounters:  04/02/22 246 lb 9.6 oz (111.9 kg)  03/06/22 249 lb (112.9 kg)  02/25/22 248 lb 12.8 oz (112.9 kg)    Body mass index is 35.38 kg/m.  Performance status (ECOG): 1 - Symptomatic but completely ambulatory  PHYSICAL EXAM:  Physical Exam Vitals and nursing note reviewed.  Constitutional:      General: He is not in acute distress.    Appearance: Normal appearance. He is normal weight.  HENT:     Head: Normocephalic and atraumatic.     Mouth/Throat:     Mouth: Mucous membranes are moist.     Pharynx: Oropharynx is clear. No oropharyngeal exudate or posterior oropharyngeal erythema.  Eyes:     General: No scleral icterus.    Extraocular Movements: Extraocular movements intact.     Conjunctiva/sclera: Conjunctivae normal.     Pupils: Pupils are equal, round, and reactive to light.  Cardiovascular:     Rate and Rhythm: Normal rate and regular rhythm.     Heart sounds: Normal heart sounds. No murmur heard.    No friction rub. No gallop.  Pulmonary:     Effort: Pulmonary effort is normal.     Breath sounds: Normal breath sounds. No wheezing, rhonchi or rales.  Abdominal:     General: Bowel sounds are normal. There is no distension.     Palpations: Abdomen is soft. There is no hepatomegaly, splenomegaly or mass.     Tenderness:  There is no abdominal tenderness.  Musculoskeletal:        General: Normal range of motion.     Cervical back: Normal range of motion and neck supple. No tenderness.     Right lower leg: No edema.     Left lower leg: No edema.  Lymphadenopathy:     Cervical: No cervical adenopathy.     Upper Body:     Right upper body: No supraclavicular or axillary adenopathy.     Left upper body: No supraclavicular or axillary adenopathy.     Lower Body: No right inguinal adenopathy. No left inguinal adenopathy.  Skin:    General: Skin is warm and dry.     Coloration: Skin is not jaundiced.     Findings: No rash.  Neurological:     Mental Status: He is alert and oriented to person, place, and time.     Cranial Nerves: No cranial nerve deficit.  Psychiatric:        Mood and Affect: Mood normal.  Behavior: Behavior normal.        Thought Content: Thought content normal.     LABS:      Latest Ref Rng & Units 04/02/2022    2:33 PM 02/25/2022   12:00 AM 11/26/2021   12:00 AM  CBC  WBC 4.0 - 10.5 K/uL 7.1  6.0     6.4      Hemoglobin 13.0 - 17.0 g/dL 14.3  13.7     14.0      Hematocrit 39.0 - 52.0 % 43.2  41     41      Platelets 150 - 400 K/uL 162  147     154         This result is from an external source.      Latest Ref Rng & Units 04/02/2022    2:33 PM 02/25/2022   12:00 AM 11/26/2021   12:00 AM  CMP  Glucose 70 - 99 mg/dL 97     BUN 8 - 23 mg/dL _0 Creatinine 0.61 - 1.24 mg/dL 1.57  1.7     1.6      Sodium 135 - 145 mmol/L 142  135     139      Potassium 3.5 - 5.1 mmol/L 4.1  4.3     3.8      Chloride 98 - 111 mmol/L 111  106     110      CO2 22 - 32 mmol/L _1 Calcium 8.9 - 10.3 mg/dL 8.8  8.7     8.5      Total Protein 6.5 - 8.1 g/dL 6.7     Total Bilirubin 0.3 - 1.2 mg/dL 1.0     Alkaline Phos 38 - 126 U/L 60  48     41      AST 15 - 41 U/L 31  35     34      ALT 0 - 44 U/L 43  49     36         This result is from an external source.      No results found for: "CEA1", "CEA" / No results found for: "CEA1", "CEA" No results found for: "PSA1" No results found for: "ZOX096" No results found for: "CAN125"  No results found for: "TOTALPROTELP", "ALBUMINELP", "A1GS", "A2GS", "BETS", "BETA2SER", "GAMS", "MSPIKE", "SPEI" No results found for: "TIBC", "FERRITIN", "IRONPCTSAT" No results found for: "LDH"  STUDIES:  No results found.    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[place])    Secondary malignant carcinoid tumor of liver [facility])     Past Surgical History:  Procedure Laterality Date   CYSTOTOMY  01/2020   IR URETERAL STENT LEFT NEW ACCESS W/O SEP NEPHROSTOMY CATH  06/13/2020   IR URETERAL STENT PLACEMENT EXISTING ACCESS LEFT  06/15/2020   LYMPH NODE BIOPSY Left    supraclavicular   NEPHROLITHOTOMY Left 06/13/2020   Procedure: NEPHROLITHOTOMY PERCUTANEOUS;  Surgeon: Robley Fries, MD;  Location: [facility] ORS;  Service: Urology;  Laterality: Left;  2 HRS   ORCHIECTOMY  7034   UMBILICAL HERNIA REPAIR      Family History  Problem Relation Age of Onset   Cancer Mother        jaw bone   Throat cancer Father        smoker    Social History:  reports that he quit smoking about 26 years ago. His smoking use included cigarettes. He has a 4.75 pack-year smoking history. He has never used smokeless tobacco. He reports current alcohol use. He reports that he does not use drugs.The patient is alone today.  Allergies: No Known Allergies  Current Medications: Current Outpatient Medications  Medication Sig Dispense Refill   octreotide (SANDOSTATIN LAR) 20 MG injection Inject 20 mg into the muscle every 28 (twenty-eight) days.      No current facility-administered medications for this visit.

## 2022-04-03 ENCOUNTER — Encounter: Payer: Self-pay | Admitting: Oncology

## 2022-04-03 ENCOUNTER — Inpatient Hospital Stay: Payer: 59

## 2022-04-03 ENCOUNTER — Ambulatory Visit: Payer: 59

## 2022-04-03 VITALS — BP 137/91 | HR 62 | Temp 98.5°F | Resp 18 | Ht 70.0 in | Wt 246.5 lb

## 2022-04-03 DIAGNOSIS — E340 Carcinoid syndrome: Secondary | ICD-10-CM | POA: Diagnosis not present

## 2022-04-03 DIAGNOSIS — C7B8 Other secondary neuroendocrine tumors: Secondary | ICD-10-CM

## 2022-04-03 DIAGNOSIS — C7A00 Malignant carcinoid tumor of unspecified site: Secondary | ICD-10-CM

## 2022-04-03 MED ORDER — OCTREOTIDE ACETATE 30 MG IM KIT
30.0000 mg | PACK | Freq: Once | INTRAMUSCULAR | Status: AC
  Administered 2022-04-03: 30 mg via INTRAMUSCULAR
  Filled 2022-04-03: qty 1

## 2022-04-03 NOTE — Patient Instructions (Signed)
Octreotide Injection Suspension What is this medication? OCTREOTIDE (ok TREE oh tide) treats high levels of growth hormone (acromegaly). It works by reducing the amount of growth hormone your body makes. This reduces symptoms and the risk of health problems caused by too much growth hormone, such as diabetes and heart disease. It may also be used to treat diarrhea caused by neuroendocrine tumors. It works by slowing down the release of serotonin from the tumor cells. This reduces the number of bowel movements you have. This medicine may be used for other purposes; ask your health care provider or pharmacist if you have questions. COMMON BRAND NAME(S): Sandostatin LAR What should I tell my care team before I take this medication? They need to know if you have any of these conditions: Diabetes Gallbladder disease Kidney disease Liver disease Thyroid disease An unusual or allergic reaction to octreotide, other medications, foods, dyes, or preservatives Pregnant or trying to get pregnant Breast-feeding How should I use this medication? This medication is injected into a muscle. It is usually given by your care team in a hospital or clinic setting. Talk to your care team about the use of this medication in children. Special care may be needed. Overdosage: If you think you have taken too much of this medicine contact a poison control center or emergency room at once. NOTE: This medicine is only for you. Do not share this medicine with others. What if I miss a dose? Keep appointments for follow-up doses. It is important not to miss your dose. Call your care team if you are unable to keep an appointment. What may interact with this medication? Do not take this medication with any of the following: Cisapride Dronedarone Flibanserin Lutetium Lu 177 dotatate Pimozide Saquinavir Thioridazine This medication may also interact with the following: Bromocriptine Certain medications for blood  pressure, heart disease, irregular heartbeat Cyclosporine Diuretics Medications for diabetes, including insulin Quinidine This list may not describe all possible interactions. Give your health care provider a list of all the medicines, herbs, non-prescription drugs, or dietary supplements you use. Also tell them if you smoke, drink alcohol, or use illegal drugs. Some items may interact with your medicine. What should I watch for while using this medication? Visit your care team for regular checks on your progress. Tell your care team if your symptoms do not start to get better or if they get worse. This medication may cause decreases in blood sugar. Signs of low blood sugar include chills, cool, pale skin or cold sweats, drowsiness, extreme hunger, fast heartbeat, headache, nausea, nervousness or anxiety, shakiness, trembling, unsteadiness, tiredness, or weakness. Contact your care team right away if you experience any of these symptoms. This medication may increase blood sugar. The risk may be higher in patients who already have diabetes. Ask your care team what you can do to lower your risk of diabetes while taking this medication. You should make sure you get enough vitamin B12 while you are taking this medication. Discuss the foods you eat and the vitamins you take with your care team. What side effects may I notice from receiving this medication? Side effects that you should report to your care team as soon as possible: Allergic reactions--skin rash, itching, hives, swelling of the face, lips, tongue, or throat Gallbladder problems--severe stomach pain, nausea, vomiting, fever Heart rhythm changes--fast or irregular heartbeat, dizziness, feeling faint or lightheaded, chest pain, trouble breathing High blood sugar (hyperglycemia)--increased thirst or amount of urine, unusual weakness or fatigue, blurry vision Low blood   sugar (hypoglycemia)--tremors or shaking, anxiety, sweating, cold or clammy  skin, confusion, dizziness, rapid heartbeat Low thyroid levels (hypothyroidism)--unusual weakness or fatigue, increased sensitivity to cold, constipation, hair loss, dry skin, weight gain, feelings of depression Low vitamin B12 level--pain, tingling, or numbness in the hands or feet, muscle weakness, dizziness, confusion, trouble concentrating Pancreatitis--severe stomach pain that spreads to your back or gets worse after eating or when touched, fever, nausea, vomiting Side effects that usually do not require medical attention (report to your care team if they continue or are bothersome): Diarrhea Dizziness Gas Headache Pain, redness, or irritation at injection site Stomach pain This list may not describe all possible side effects. Call your doctor for medical advice about side effects. You may report side effects to FDA at 1-800-FDA-1088. Where should I keep my medication? This medication is given in a hospital or clinic. It will not be stored at home. NOTE: This sheet is a summary. It may not cover all possible information. If you have questions about this medicine, talk to your doctor, pharmacist, or health care provider.  2023 Elsevier/Gold Standard (2021-06-15 00:00:00)  

## 2022-04-04 ENCOUNTER — Inpatient Hospital Stay: Payer: 59

## 2022-04-04 ENCOUNTER — Telehealth: Payer: Self-pay

## 2022-04-04 LAB — CHROMOGRANIN A: Chromogranin A (ng/mL): 145.4 ng/mL — ABNORMAL HIGH (ref 0.0–101.8)

## 2022-04-04 NOTE — Telephone Encounter (Signed)
-----   Message from Marvia Pickles, PA-C sent at 04/03/2022  5:18 PM EST ----- Please let him know labs look good, but don't have result of cancer test. Thanks

## 2022-04-04 NOTE — Telephone Encounter (Signed)
Patient notified and voiced understanding.

## 2022-04-05 ENCOUNTER — Telehealth: Payer: Self-pay

## 2022-04-05 NOTE — Telephone Encounter (Signed)
-----   Message from Marvia Pickles, PA-C sent at 04/05/2022  9:05 AM EST ----- Please let him know his cancer test is slightly lower than previous. Thanks

## 2022-04-05 NOTE — Telephone Encounter (Signed)
Message left labs slightly lower than previous , call back with any questions.

## 2022-04-26 ENCOUNTER — Other Ambulatory Visit: Payer: Self-pay | Admitting: Oncology

## 2022-04-26 DIAGNOSIS — C7A00 Malignant carcinoid tumor of unspecified site: Secondary | ICD-10-CM

## 2022-04-29 NOTE — Progress Notes (Signed)
[facility]  [address] [place],  [place]  [address] [phone]  Clinic Day:  04/30/22  Referring physician: Carvel Getting Key, *  ASSESSMENT & PLAN:   Assessment & Plan: Malignant carcinoid tumor of unknown primary site [facility]) Metastatic carcinoid tumor consistent with gastrointestinal origin in September 2021.  He does have a terminal ileum mass measuring 4.0 x 2.8 x 3.7 cm, and a mass of the mesentery of the small intestine measuring 3.9 x 2.3 x 4.0 cm.  As this has already metastasized to the liver, surgical resection was not indicated.  24 hour urine for 5 HIAA was quite elevated at 86.8 consistent with carcinoid syndrome.  Chromogranin A was also elevated at 214. He started treatment with octreotide injections every 4 weeks in October 2021.  Due to the persistent diarrhea, we increased the octreotide to 30 mg every 4 weeks. CT imaging in August 2022 was stable.  The chromogranin A from November 2022 was down to 118.6, but has been increasing this year.  CT scan in April 2023 remained stable.  The chromogranin A was 152 in July.  The patient was hospitalized in September. CT abdomen pelvis at that time revealed small-bowel obstruction, with transition point in the mid to distal jejunum adjacent to the known mesenteric carcinoid tumor. Stable, 3.9 cm, calcified central mesenteric mass, consistent with carcinoid tumor. Numerous liver masses, consistent with metastatic carcinoid tumor. Index lesions not appreciably changed since prior exam. The chromogranin A was up to 169 in October of 2023, but went down to 145.4 in December 2023.  Secondary neuroendocrine tumor of liver ([place]) Multiple liver masses, the largest measuring 4.0 x 4.3 cm, consistent with metastatic carcinoid with carcinoid syndrome, which were stable on CT imaging in September of 2023.         He was just widowed last month on December 19 and so is still grieving.  He has lithotripsy  scheduled for Friday, January 5 and a repeat sleep apnea study on January 15.  I think he needs to keep busy at this point in time.  I will see him back in 4 weeks with CBC and CMP for his next dose of octreotide. The patient understands the plans discussed today and is in agreement with them.  He knows to contact our office if he develops concerns prior to his next appointment.   I provided 15 minutes of face-to-face time during this encounter and > 50% was spent counseling as documented under my assessment and plan.   ADDENDUM: His creatinine went up to 1.85 this time, from 1.57, but this may be related to his renal lithiasis. The rest is normal. We will continue to monitor.   Derwood Kaplan, MD  [facility] [address] [place] [address] Dept: [phone] Dept Fax: [phone]   No orders of the defined types were placed in this encounter.     CHIEF COMPLAINT:  CC: Metastatic carcinoid  Current Treatment: Octreotide 30 mg every 4 weeks  HISTORY OF PRESENT ILLNESS:  Glenn Bender is a 62 y.o. male with metastatic carcinoid to liver diagnosed in September 2021.  The patient presented in September with hematuria, felt to be due to a kidney stone.  CT abdomen and pelvis revealed multiple liver masses, the largest measuring 4.0 x 4.3 cm, a terminal ileum mass measuring 4.0 x 2.8 x 3.7 cm, and a mass of the mesentery of the small intestine  measuring 3.9 x 2.3 x 4.0 cm.  Diagnostic colonoscopy with Dr. Coralie Keens in September revealed a large mucosal covered mass protruding through the ileocecal valve with peristalsis.  Biopsy was collected and surgical pathology was benign.  Liver biopsy revealed metastatic neuroendocrine tumor, grade 2, consistent with gastrointestinal primary. Synaptophysin and chromogranin-A immunostains were positive as well as CDX-2 supporting gastrointestinal origin.  Ki67 was 6%.  He reports  flushing of his face with alcohol and certain foods.  He reported diarrhea, as well as flushing.  24 hour urine for 5HIAA was also elevated at 86.8, which is consistent with carcinoid.  Chromogranin A was elevated at 214 in October and came down to 116.9 in December.  CT chest from November 2021 revealed left paratracheal adenopathy and pleural nodularity along the right hemidiaphragm measuring up to 12 mm, worrisome for metastatic disease. As he had metastatic disease, resection of his primary was not recommended. The patient was started on octreotide 20 mg IM monthly in October.  Octreotide was subsequently increased to 30 mg monthly in December due to persistent diarrhea.   CT chest, abdomen and pelvis in February 2022 revealed mostly stable to slightly decreased appearance of the carcinoid tumor, including the mesenteric lesion with cicatricial reaction, adjacent wall.  There was increased nodularity in a bandlike fashion along the mesenteric vessels adjacent to the carcinoid tumor. The liver lesions were stable with no new lesions identified. There was stable nodularity/lobularity along the right hemidiaphragm.  He has had a continued decrease in the Chromogranin A, which was 111.4 in February, and then 100.3 in March, which is within normal limits.  He had improvement in his diarrhea with the increase in octreotide. The chromogranin A was up slightly in May to 126. CT chest, abdomen and pelvis in August revealed no new or progressive metastatic disease.  Foci of masslike wall thickening within a central right abdominal small bowel loop and at the terminal ileum are stable. Calcified central mesenteric adenopathy is stable. Findings are suggestive of multifocal small bowel carcinoid with conglomerate mesenteric nodal metastases.  Widespread hyperenhancing liver metastases are stable.  Stable clustered subpleural solid pulmonary nodules along the right hemidiaphragm in the basilar right lower lobe are  compatible with pulmonary metastases.  There is new background diffuse hepatic steatosis. Chromogranin A was down to 107.5 in August.  The chromogranin A was 118 in November.  CT abdomen and pelvis in April 2023 revealed stable hepatic metastatic disease and stable hepatic steatosis.  There was a stable 3.6 cm partially calcified mesenteric mass, consistent with carcinoid tumor.  No evidence of new or progressive metastatic disease.  Bilateral nephrolithiasis, a small left inguinal hernia and aortic atherosclerosis were also seen.  He was hospitalized in September at [facility] with a small bowel obstruction.  CT abdomen and pelvis at that time revealed small-bowel obstruction, with transition point in the mid to distal jejunum adjacent to the known mesenteric carcinoid tumor. Stable calcified central mesenteric mass consistent with carcinoid tumor. Numerous liver masses, consistent with metastatic carcinoid tumor. Index lesions not appreciably changed since prior exam. Hepatic steatosis, a non-obstructing left renal calculus and aortic atherosclerosis was seen.  We have continued octreotide 30 mg every 4 weeks with good control of his diarrhea.  INTERVAL HISTORY:  Glenn Bender is here today for repeat clinical assessment and states he has been doing fair.  His wife expired on December 19 and so he is understandably grieving he states he was diagnosed with sleep apnea, and he will have a  repeat sleep study with the machine on January 15.  He has renal lithiasis and lithotripsy is scheduled for Friday, January 5.  He denies nausea, vomiting, diarrhea, constipation or abdominal pain.  He denies sore throat, shortness of breath or chest pain.  He denies fevers or chills. He denies pain. His appetite is good. His weight has decreased 5 pounds over last month .  REVIEW OF SYSTEMS:  Review of Systems  Constitutional:  Negative for appetite change, chills, fatigue, fever and unexpected weight change.  HENT:   Negative for  lump/mass, mouth sores and sore throat.   Respiratory:  Negative for shortness of breath.   Cardiovascular:  Negative for chest pain and leg swelling.  Gastrointestinal:  Negative for abdominal pain, constipation, diarrhea, nausea and vomiting.  Genitourinary:  Negative for difficulty urinating, dysuria, frequency and hematuria.   Musculoskeletal:  Negative for arthralgias, back pain and myalgias.  Skin:  Negative for itching, rash and wound.  Neurological:  Negative for dizziness, extremity weakness, headaches, light-headedness and numbness.  Hematological:  Negative for adenopathy.  Psychiatric/Behavioral:  Negative for depression and sleep disturbance. The patient is not nervous/anxious.      VITALS:  Blood pressure (!) 140/94, pulse 81, temperature 97.9 F (36.6 C), temperature source Oral, resp. rate 18, height _0  (1.778 m), weight 241 lb 6.4 oz (109.5 kg), SpO2 99 %.  Wt Readings from Last 3 Encounters:  05/01/22 241 lb 1 oz (109.3 kg)  04/30/22 241 lb 6.4 oz (109.5 kg)  04/03/22 246 lb 8 oz (111.8 kg)    Body mass index is 34.64 kg/m.  Performance status (ECOG): 1 - Symptomatic but completely ambulatory  PHYSICAL EXAM:  Physical Exam Vitals and nursing note reviewed.  Constitutional:      General: He is not in acute distress.    Appearance: Normal appearance. He is normal weight.  HENT:     Head: Normocephalic and atraumatic.     Mouth/Throat:     Mouth: Mucous membranes are moist.     Pharynx: Oropharynx is clear. No oropharyngeal exudate or posterior oropharyngeal erythema.  Eyes:     General: No scleral icterus.    Extraocular Movements: Extraocular movements intact.     Conjunctiva/sclera: Conjunctivae normal.     Pupils: Pupils are equal, round, and reactive to light.  Cardiovascular:     Rate and Rhythm: Normal rate and regular rhythm.     Heart sounds: Normal heart sounds. No murmur heard.    No friction rub. No gallop.  Pulmonary:     Effort: Pulmonary  effort is normal.     Breath sounds: Normal breath sounds. No wheezing, rhonchi or rales.  Abdominal:     General: Bowel sounds are normal. There is no distension.     Palpations: Abdomen is soft. There is no hepatomegaly, splenomegaly or mass.     Tenderness: There is no abdominal tenderness.  Musculoskeletal:        General: Normal range of motion.     Cervical back: Normal range of motion and neck supple. No tenderness.     Right lower leg: No edema.     Left lower leg: No edema.  Lymphadenopathy:     Cervical: No cervical adenopathy.     Upper Body:     Right upper body: No supraclavicular or axillary adenopathy.     Left upper body: No supraclavicular or axillary adenopathy.     Lower Body: No right inguinal adenopathy. No left inguinal adenopathy.  Skin:  General: Skin is warm and dry.     Coloration: Skin is not jaundiced.     Findings: No rash.  Neurological:     Mental Status: He is alert and oriented to person, place, and time.     Cranial Nerves: No cranial nerve deficit.  Psychiatric:        Mood and Affect: Mood normal.        Behavior: Behavior normal.        Thought Content: Thought content normal.     LABS:      Latest Ref Rng & Units 04/30/2022   11:03 AM 04/02/2022    2:33 PM 02/25/2022   12:00 AM  CBC  WBC 4.0 - 10.5 K/uL 6.0  7.1  6.0      Hemoglobin 13.0 - 17.0 g/dL 15.3  14.3  13.7      Hematocrit 39.0 - 52.0 % 46.6  43.2  41      Platelets 150 - 400 K/uL 161  162  147         This result is from an external source.      Latest Ref Rng & Units 04/30/2022   11:03 AM 04/02/2022    2:33 PM 02/25/2022   12:00 AM  CMP  Glucose 70 - 99 mg/dL 88  97    BUN 8 - 23 mg/dL _0 Creatinine 0.61 - 1.24 mg/dL 1.85  1.57  1.7      Sodium 135 - 145 mmol/L 142  142  135      Potassium 3.5 - 5.1 mmol/L 4.1  4.1  4.3      Chloride 98 - 111 mmol/L 107  111  106      CO2 22 - 32 mmol/L _1 Calcium 8.9 - 10.3 mg/dL 8.9  8.8  8.7       Total Protein 6.5 - 8.1 g/dL 7.2  6.7    Total Bilirubin 0.3 - 1.2 mg/dL 0.8  1.0    Alkaline Phos 38 - 126 U/L 52  60  48      AST 15 - 41 U/L 33  31  35      ALT 0 - 44 U/L 45  43  49         This result is from an external source.     No results found for: "CEA1", "CEA" / No results found for: "CEA1", "CEA" No results found for: "PSA1" No results found for: "ZOX096" No results found for: "CAN125"  No results found for: "TOTALPROTELP", "ALBUMINELP", "A1GS", "A2GS", "BETS", "BETA2SER", "GAMS", "MSPIKE", "SPEI" No results found for: "TIBC", "FERRITIN", "IRONPCTSAT" No results found for: "LDH"  STUDIES:  No results found.    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[place])    Secondary malignant carcinoid tumor of liver [facility])     Past Surgical History:  Procedure Laterality Date   CYSTOTOMY  01/2020   IR URETERAL STENT LEFT NEW ACCESS W/O SEP NEPHROSTOMY CATH  06/13/2020   IR URETERAL STENT PLACEMENT EXISTING ACCESS LEFT  06/15/2020   LYMPH NODE BIOPSY Left    supraclavicular   NEPHROLITHOTOMY Left 06/13/2020   Procedure: NEPHROLITHOTOMY PERCUTANEOUS;  Surgeon: Robley Fries, MD;  Location: [facility];  Service: Urology;  Laterality: Left;  2 HRS   ORCHIECTOMY  9450   UMBILICAL HERNIA REPAIR      Family History  Problem Relation Age of Onset   Cancer Mother        jaw bone   Throat cancer Father        smoker    Social History:  reports that he quit smoking about 27 years ago. His smoking use included cigarettes. He has a 4.75 pack-year smoking history. He has never used smokeless tobacco. He reports current alcohol use. He reports that he does not use drugs.The patient is  alone today.  Allergies: No Known Allergies  Current Medications: Current Outpatient Medications  Medication Sig Dispense Refill   octreotide (SANDOSTATIN LAR) 20 MG injection Inject 20 mg into the muscle every 28 (twenty-eight) days.     No current facility-administered medications for this visit.

## 2022-04-30 ENCOUNTER — Telehealth: Payer: Self-pay

## 2022-04-30 ENCOUNTER — Inpatient Hospital Stay: Payer: 59 | Attending: Oncology | Admitting: Oncology

## 2022-04-30 ENCOUNTER — Other Ambulatory Visit: Payer: Self-pay | Admitting: Oncology

## 2022-04-30 ENCOUNTER — Encounter: Payer: Self-pay | Admitting: Oncology

## 2022-04-30 ENCOUNTER — Inpatient Hospital Stay: Payer: 59

## 2022-04-30 VITALS — BP 140/94 | HR 81 | Temp 97.9°F | Resp 18 | Ht 70.0 in | Wt 241.4 lb

## 2022-04-30 DIAGNOSIS — C7A00 Malignant carcinoid tumor of unspecified site: Secondary | ICD-10-CM | POA: Insufficient documentation

## 2022-04-30 DIAGNOSIS — C7B02 Secondary carcinoid tumors of liver: Secondary | ICD-10-CM | POA: Diagnosis present

## 2022-04-30 DIAGNOSIS — E340 Carcinoid syndrome: Secondary | ICD-10-CM | POA: Insufficient documentation

## 2022-04-30 DIAGNOSIS — C7B8 Other secondary neuroendocrine tumors: Secondary | ICD-10-CM | POA: Diagnosis not present

## 2022-04-30 LAB — CBC WITH DIFFERENTIAL (CANCER CENTER ONLY)
Abs Immature Granulocytes: 0.02 10*3/uL (ref 0.00–0.07)
Basophils Absolute: 0.1 10*3/uL (ref 0.0–0.1)
Basophils Relative: 1 %
Eosinophils Absolute: 0.1 10*3/uL (ref 0.0–0.5)
Eosinophils Relative: 2 %
HCT: 46.6 % (ref 39.0–52.0)
Hemoglobin: 15.3 g/dL (ref 13.0–17.0)
Immature Granulocytes: 0 %
Lymphocytes Relative: 14 %
Lymphs Abs: 0.9 10*3/uL (ref 0.7–4.0)
MCH: 29.1 pg (ref 26.0–34.0)
MCHC: 32.8 g/dL (ref 30.0–36.0)
MCV: 88.8 fL (ref 80.0–100.0)
Monocytes Absolute: 0.3 10*3/uL (ref 0.1–1.0)
Monocytes Relative: 5 %
Neutro Abs: 4.7 10*3/uL (ref 1.7–7.7)
Neutrophils Relative %: 78 %
Platelet Count: 161 10*3/uL (ref 150–400)
RBC: 5.25 MIL/uL (ref 4.22–5.81)
RDW: 14 % (ref 11.5–15.5)
WBC Count: 6 10*3/uL (ref 4.0–10.5)
nRBC: 0 % (ref 0.0–0.2)

## 2022-04-30 LAB — CMP (CANCER CENTER ONLY)
ALT: 45 U/L — ABNORMAL HIGH (ref 0–44)
AST: 33 U/L (ref 15–41)
Albumin: 4.3 g/dL (ref 3.5–5.0)
Alkaline Phosphatase: 52 U/L (ref 38–126)
Anion gap: 10 (ref 5–15)
BUN: 19 mg/dL (ref 8–23)
CO2: 25 mmol/L (ref 22–32)
Calcium: 8.9 mg/dL (ref 8.9–10.3)
Chloride: 107 mmol/L (ref 98–111)
Creatinine: 1.85 mg/dL — ABNORMAL HIGH (ref 0.61–1.24)
GFR, Estimated: 40 mL/min — ABNORMAL LOW (ref >60)
Glucose, Bld: 88 mg/dL (ref 70–99)
Potassium: 4.1 mmol/L (ref 3.5–5.1)
Sodium: 142 mmol/L (ref 135–145)
Total Bilirubin: 0.8 mg/dL (ref 0.3–1.2)
Total Protein: 7.2 g/dL (ref 6.5–8.1)

## 2022-04-30 NOTE — Telephone Encounter (Signed)
Attempted to contact patient. No answer and no VM.  

## 2022-04-30 NOTE — Addendum Note (Signed)
Addended by: Juanetta Beets on: 04/30/2022 04:23 PM   Modules accepted: Orders

## 2022-04-30 NOTE — Telephone Encounter (Signed)
-----   Message from Derwood Kaplan, MD sent at 04/30/2022  1:41 PM EST ----- Regarding: call Tell him kidneys are a little worse but everything else is normal.  Do not take any NSAIDS. Send copy to his PCP.  I wonder if related to his kidney stones, will recheck next month

## 2022-05-01 ENCOUNTER — Inpatient Hospital Stay: Payer: 59

## 2022-05-01 VITALS — BP 127/74 | HR 63 | Temp 98.4°F | Resp 18 | Wt 241.1 lb

## 2022-05-01 DIAGNOSIS — C7A00 Malignant carcinoid tumor of unspecified site: Secondary | ICD-10-CM

## 2022-05-01 DIAGNOSIS — E340 Carcinoid syndrome: Secondary | ICD-10-CM | POA: Diagnosis not present

## 2022-05-01 DIAGNOSIS — C7B8 Other secondary neuroendocrine tumors: Secondary | ICD-10-CM

## 2022-05-01 MED ORDER — OCTREOTIDE ACETATE 30 MG IM KIT
30.0000 mg | PACK | Freq: Once | INTRAMUSCULAR | Status: AC
  Administered 2022-05-01: 30 mg via INTRAMUSCULAR
  Filled 2022-05-01: qty 1

## 2022-05-01 NOTE — Patient Instructions (Signed)
Octreotide Injection Solution What is this medication? OCTREOTIDE (ok TREE oh tide) treats high levels of growth hormone (acromegaly). It works by reducing the amount of growth hormone your body makes. This reduces symptoms and the risk of health problems caused by too much growth hormone, such as diabetes and heart disease. It may also be used to treat diarrhea caused by neuroendocrine tumors. It works by slowing down the release of serotonin from the tumor cells. This reduces the number of bowel movements you have. This medicine may be used for other purposes; ask your health care provider or pharmacist if you have questions. COMMON BRAND NAME(S): Bynfezia, Sandostatin What should I tell my care team before I take this medication? They need to know if you have any of these conditions: Diabetes Gallbladder disease Kidney disease Liver disease Thyroid disease An unusual or allergic reaction to octreotide, other medications, foods, dyes, or preservatives Pregnant or trying to get pregnant Breast-feeding How should I use this medication? This medication is injected under the skin or into a vein. It is usually given by your care team in a hospital or clinic setting. If you get this medication at home, you will be taught how to prepare and give it. Use exactly as directed. Take it as directed on the prescription label at the same time every day. Keep taking it unless your care team tells you to stop. Allow the injection solution to come to room temperature before use. Do not warm it artificially. It is important that you put your used needles and syringes in a special sharps container. Do not put them in a trash can. If you do not have a sharps container, call your pharmacist or care team to get one. Talk to your care team about the use of this medication in children. Special care may be needed. Overdosage: If you think you have taken too much of this medicine contact a poison control center or  emergency room at once. NOTE: This medicine is only for you. Do not share this medicine with others. What if I miss a dose? If you miss a dose, take it as soon as you can. If it is almost time for your next dose, take only that dose. Do not take double or extra doses. What may interact with this medication? Bromocriptine Certain medications for blood pressure, heart disease, irregular heartbeat Cyclosporine Diuretics Medications for diabetes, including insulin Quinidine This list may not describe all possible interactions. Give your health care provider a list of all the medicines, herbs, non-prescription drugs, or dietary supplements you use. Also tell them if you smoke, drink alcohol, or use illegal drugs. Some items may interact with your medicine. What should I watch for while using this medication? Visit your care team for regular checks on your progress. Tell your care team if your symptoms do not start to get better or if they get worse. To help reduce irritation at the injection site, use a different site for each injection and make sure the solution is at room temperature before use. This medication may cause decreases in blood sugar. Signs of low blood sugar include chills, cool, pale skin or cold sweats, drowsiness, extreme hunger, fast heartbeat, headache, nausea, nervousness or anxiety, shakiness, trembling, unsteadiness, tiredness, or weakness. Contact your care team right away if you experience any of these symptoms. This medication may increase blood sugar. The risk may be higher in patients who already have diabetes. Ask your care team what you can do to lower your   risk of diabetes while taking this medication. You should make sure you get enough vitamin B12 while you are taking this medication. Discuss the foods you eat and the vitamins you take with your care team. What side effects may I notice from receiving this medication? Side effects that you should report to your care  team as soon as possible: Allergic reactions--skin rash, itching, hives, swelling of the face, lips, tongue, or throat Gallbladder problems--severe stomach pain, nausea, vomiting, fever Heart rhythm changes--fast or irregular heartbeat, dizziness, feeling faint or lightheaded, chest pain, trouble breathing High blood sugar (hyperglycemia)--increased thirst or amount of urine, unusual weakness or fatigue, blurry vision Low blood sugar (hypoglycemia)--tremors or shaking, anxiety, sweating, cold or clammy skin, confusion, dizziness, rapid heartbeat Low thyroid levels (hypothyroidism)--unusual weakness or fatigue, increased sensitivity to cold, constipation, hair loss, dry skin, weight gain, feelings of depression Low vitamin B12 level--pain, tingling, or numbness in the hands or feet, muscle weakness, dizziness, confusion, trouble concentrating Pancreatitis--severe stomach pain that spreads to your back or gets worse after eating or when touched, fever, nausea, vomiting Side effects that usually do not require medical attention (report to your care team if they continue or are bothersome): Diarrhea Dizziness Gas Headache Pain, redness, or irritation at injection site Stomach pain This list may not describe all possible side effects. Call your doctor for medical advice about side effects. You may report side effects to FDA at 1-800-FDA-1088. Where should I keep my medication? Keep out of the reach of children and pets. Store in the refrigerator. Protect from light. Allow to come to room temperature naturally. Do not use artificial heat. If protected from light, the injection may be stored between 20 and 30 degrees C (70 and 86 degrees F) for 14 days. After the initial use, throw away any unused portion of a multiple dose vial after 14 days. Get rid of any unused portions of the ampules after use. To get rid of medications that are no longer needed or have expired: Take the medication to a medication  take-back program. Ask your pharmacy or law enforcement to find a location. If you cannot return the medication, ask your pharmacist or care team how to get rid of the medication safely. NOTE: This sheet is a summary. It may not cover all possible information. If you have questions about this medicine, talk to your doctor, pharmacist, or health care provider.  2023 Elsevier/Gold Standard (2021-07-18 00:00:00)  

## 2022-05-02 ENCOUNTER — Ambulatory Visit: Payer: 59

## 2022-05-20 ENCOUNTER — Encounter: Payer: Self-pay | Admitting: Oncology

## 2022-05-23 NOTE — Progress Notes (Signed)
[facility]  [address] [place],  [place]  [id number] [phone]  Clinic Day:  05/28/22  Referring physician: Carvel Getting Key, *  ASSESSMENT & PLAN:   Assessment & Plan: Malignant carcinoid tumor of unknown primary site [facility]) Metastatic carcinoid tumor consistent with gastrointestinal origin in September 2021.  He does have a terminal ileum mass measuring 4.0 x 2.8 x 3.7 cm, and a mass of the mesentery of the small intestine measuring 3.9 x 2.3 x 4.0 cm.  As this has already metastasized to the liver, surgical resection was not indicated.  24 hour urine for 5 HIAA was quite elevated at 86.8 consistent with carcinoid syndrome.  Chromogranin A was also elevated at 214. He started treatment with octreotide injections every 4 weeks in October 2021.  Due to the persistent diarrhea, we increased the octreotide to 30 mg every 4 weeks. CT imaging in August 2022 was stable.  The chromogranin A from November 2022 was down to 118.6, but has been increasing this year.  CT scan in April 2023 remained stable.  The chromogranin A was 152 in July.  The patient was hospitalized in September. CT abdomen pelvis at that time revealed small-bowel obstruction, with transition point in the mid to distal jejunum adjacent to the known mesenteric carcinoid tumor. There is a stable, 3.9 cm, calcified central mesenteric mass, consistent with carcinoid tumor, and numerous liver masses, consistent with metastatic carcinoid tumor. The index lesions are not appreciably changed since prior exam. The chromogranin A was up to 169 in October of 2023, but went down to 145.4 in December 2023.  Secondary neuroendocrine tumor of liver ([place]) Multiple liver masses, the largest measuring 4.0 x 4.3 cm, consistent with metastatic carcinoid with carcinoid syndrome, which were stable on CT imaging in September of 2023.         Plan: We will continue with treatment tomorrow, I will see him back in 4  weeks with CBC and CMP for his next dose of octreotide. The patient understands the plans discussed today and is in agreement with them.  He knows to contact our office if he develops concerns prior to his next appointment.   I provided 15 minutes of face-to-face time during this encounter and > 50% was spent counseling as documented under my assessment and plan.    Derwood Kaplan, MD  [facility] [address] [place] [address] Dept: [phone] Dept Fax: [phone]   No orders of the defined types were placed in this encounter.     CHIEF COMPLAINT:  CC: Metastatic carcinoid  Current Treatment: Octreotide 30 mg every 4 weeks  HISTORY OF PRESENT ILLNESS:  Glenn Bender is a 62 y.o. male with metastatic carcinoid to liver diagnosed in September 2021.  The patient presented in September with hematuria, felt to be due to a kidney stone.  CT abdomen and pelvis revealed multiple liver masses, the largest measuring 4.0 x 4.3 cm, a terminal ileum mass measuring 4.0 x 2.8 x 3.7 cm, and a mass of the mesentery of the small intestine measuring 3.9 x 2.3 x 4.0 cm.  Diagnostic colonoscopy with Dr. Coralie Keens in September revealed a large mucosal covered mass protruding through the ileocecal valve with peristalsis.  Biopsy was collected and surgical pathology was benign.  Liver biopsy revealed metastatic neuroendocrine tumor, grade 2, consistent with gastrointestinal primary. Synaptophysin and chromogranin-A immunostains were positive as well as CDX-2 supporting gastrointestinal origin.  Ki67 was 6%.  He reports flushing of his face with alcohol and certain foods.  He reported diarrhea, as well as flushing.  24 hour urine for 5HIAA was also elevated at 86.8, which is consistent with carcinoid.  Chromogranin A was elevated at 214 in October and came down to 116.9 in December.  CT chest from November 2021 revealed left paratracheal  adenopathy and pleural nodularity along the right hemidiaphragm measuring up to 12 mm, worrisome for metastatic disease. As he had metastatic disease, resection of his primary was not recommended. The patient was started on octreotide 20 mg IM monthly in October.  Octreotide was subsequently increased to 30 mg monthly in December due to persistent diarrhea.   CT chest, abdomen and pelvis in February 2022 revealed mostly stable to slightly decreased appearance of the carcinoid tumor, including the mesenteric lesion with cicatricial reaction, adjacent wall.  There was increased nodularity in a bandlike fashion along the mesenteric vessels adjacent to the carcinoid tumor. The liver lesions were stable with no new lesions identified. There was stable nodularity/lobularity along the right hemidiaphragm.  He has had a continued decrease in the Chromogranin A, which was 111.4 in February, and then 100.3 in March, which is within normal limits.  He had improvement in his diarrhea with the increase in octreotide. The chromogranin A was up slightly in May to 126. CT chest, abdomen and pelvis in August revealed no new or progressive metastatic disease.  Foci of masslike wall thickening within a central right abdominal small bowel loop and at the terminal ileum are stable. Calcified central mesenteric adenopathy is stable. Findings are suggestive of multifocal small bowel carcinoid with conglomerate mesenteric nodal metastases.  Widespread hyperenhancing liver metastases are stable.  Stable clustered subpleural solid pulmonary nodules along the right hemidiaphragm in the basilar right lower lobe are compatible with pulmonary metastases.  There is new background diffuse hepatic steatosis. Chromogranin A was down to 107.5 in August.  The chromogranin A was 118 in November.  CT abdomen and pelvis in April 2023 revealed stable hepatic metastatic disease and stable hepatic steatosis.  There was a stable 3.6 cm partially  calcified mesenteric mass, consistent with carcinoid tumor.  No evidence of new or progressive metastatic disease.  Bilateral nephrolithiasis, a small left inguinal hernia and aortic atherosclerosis were also seen.  He was hospitalized in September at [facility] with a small bowel obstruction.  CT abdomen and pelvis at that time revealed small-bowel obstruction, with transition point in the mid to distal jejunum adjacent to the known mesenteric carcinoid tumor. There was a stable calcified central mesenteric mass consistent with carcinoid tumor, and numerous liver masses, consistent with metastatic carcinoid tumor. The index lesions are not appreciably changed since prior exam. Hepatic steatosis, a non-obstructing left renal calculus and aortic atherosclerosis were seen.  We have continued octreotide 30 mg every 4 weeks with good control of his diarrhea.  INTERVAL HISTORY:  Glenn Bender is here today for repeat clinical assessment and states he has been doing well. He notes that he still has episodes of flushing. He notes that his bowels are loose. He denies nausea, vomiting, constipation or abdominal pain.  He denies sore throat, shortness of breath or chest pain.  He denies fevers or chills. He denies pain. His appetite is good. His weight is remaining stable.   REVIEW OF SYSTEMS:  Review of Systems  Constitutional:  Negative for appetite change, chills, fatigue, fever and unexpected weight change.  HENT:   Negative for lump/mass, mouth  sores and sore throat.   Respiratory:  Negative for shortness of breath.   Cardiovascular:  Negative for chest pain and leg swelling.  Gastrointestinal:  Negative for abdominal pain, constipation, diarrhea, nausea and vomiting.  Genitourinary:  Negative for difficulty urinating, dysuria, frequency and hematuria.   Musculoskeletal:  Negative for arthralgias, back pain and myalgias.  Skin:  Negative for itching, rash and wound.  Neurological:  Negative for dizziness, extremity  weakness, headaches, light-headedness and numbness.  Hematological:  Negative for adenopathy.  Psychiatric/Behavioral:  Negative for depression and sleep disturbance. The patient is not nervous/anxious.      VITALS:  Blood pressure (!) 143/90, pulse 63, temperature 98.1 F (36.7 C), temperature source Oral, resp. rate 18, height 5' 10"$  (1.778 m), weight 242 lb 8 oz (110 kg), SpO2 100 %.  Wt Readings from Last 3 Encounters:  05/29/22 243 lb 1.9 oz (110.3 kg)  05/28/22 242 lb 8 oz (110 kg)  05/01/22 241 lb 1 oz (109.3 kg)    Body mass index is 34.8 kg/m.  Performance status (ECOG): 1 - Symptomatic but completely ambulatory  PHYSICAL EXAM:  Physical Exam Vitals and nursing note reviewed.  Constitutional:      General: He is not in acute distress.    Appearance: Normal appearance. He is normal weight.  HENT:     Head: Normocephalic and atraumatic.     Mouth/Throat:     Mouth: Mucous membranes are moist.     Pharynx: Oropharynx is clear. No oropharyngeal exudate or posterior oropharyngeal erythema.  Eyes:     General: No scleral icterus.    Extraocular Movements: Extraocular movements intact.     Conjunctiva/sclera: Conjunctivae normal.     Pupils: Pupils are equal, round, and reactive to light.  Cardiovascular:     Rate and Rhythm: Normal rate and regular rhythm.     Heart sounds: Normal heart sounds. No murmur heard.    No friction rub. No gallop.  Pulmonary:     Effort: Pulmonary effort is normal.     Breath sounds: Normal breath sounds. No wheezing, rhonchi or rales.  Abdominal:     General: Bowel sounds are normal. There is no distension.     Palpations: Abdomen is soft. There is no hepatomegaly, splenomegaly or mass.     Tenderness: There is no abdominal tenderness.     Comments: Liver is about 15 cm below the right coastal margin.   Musculoskeletal:        General: Normal range of motion.     Cervical back: Normal range of motion and neck supple. No tenderness.      Right lower leg: No edema.     Left lower leg: No edema.  Lymphadenopathy:     Cervical: No cervical adenopathy.     Upper Body:     Right upper body: No supraclavicular or axillary adenopathy.     Left upper body: No supraclavicular or axillary adenopathy.     Lower Body: No right inguinal adenopathy. No left inguinal adenopathy.  Skin:    General: Skin is warm and dry.     Coloration: Skin is not jaundiced.     Findings: No rash.  Neurological:     Mental Status: He is alert and oriented to person, place, and time.     Cranial Nerves: No cranial nerve deficit.  Psychiatric:        Mood and Affect: Mood normal.        Behavior: Behavior normal.  Thought Content: Thought content normal.     LABS:      Latest Ref Rng & Units 05/28/2022   11:24 AM 04/30/2022   11:03 AM 04/02/2022    2:33 PM  CBC  WBC 4.0 - 10.5 K/uL 6.3  6.0  7.1   Hemoglobin 13.0 - 17.0 g/dL 14.9  15.3  14.3   Hematocrit 39.0 - 52.0 % 44.8  46.6  43.2   Platelets 150 - 400 K/uL 141  161  162       Latest Ref Rng & Units 05/28/2022   11:24 AM 04/30/2022   11:03 AM 04/02/2022    2:33 PM  CMP  Glucose 70 - 99 mg/dL 129  88  97   BUN 8 - 23 mg/dL 18  19  18   $ Creatinine 0.61 - 1.24 mg/dL 1.49  1.85  1.57   Sodium 135 - 145 mmol/L 140  142  142   Potassium 3.5 - 5.1 mmol/L 4.0  4.1  4.1   Chloride 98 - 111 mmol/L 106  107  111   CO2 22 - 32 mmol/L 25  25  24   $ Calcium 8.9 - 10.3 mg/dL 8.8  8.9  8.8   Total Protein 6.5 - 8.1 g/dL 6.5  7.2  6.7   Total Bilirubin 0.3 - 1.2 mg/dL 1.0  0.8  1.0   Alkaline Phos 38 - 126 U/L 49  52  60   AST 15 - 41 U/L 28  33  31   ALT 0 - 44 U/L 39  45  43      No results found for: "CEA1", "CEA" / No results found for: "CEA1", "CEA" No results found for: "PSA1" No results found for: "[id number]" No results found for: "CAN125"  No results found for: "TOTALPROTELP", "ALBUMINELP", "A1GS", "A2GS", "BETS", "BETA2SER", "GAMS", "MSPIKE", "SPEI" No results found for: "TIBC",  "FERRITIN", "IRONPCTSAT" No results found for: "LDH"  STUDIES:  No results found.    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[place])    Secondary malignant carcinoid tumor of liver [facility])     Past Surgical History:  Procedure Laterality Date   CYSTOTOMY  01/2020   IR URETERAL STENT LEFT NEW ACCESS W/O SEP NEPHROSTOMY CATH  06/13/2020   IR URETERAL STENT PLACEMENT EXISTING ACCESS LEFT  06/15/2020   LYMPH NODE BIOPSY Left    supraclavicular   NEPHROLITHOTOMY Left 06/13/2020   Procedure: NEPHROLITHOTOMY PERCUTANEOUS;  Surgeon: Robley Fries, MD;  Location: [facility];  Service: Urology;  Laterality: Left;  2 HRS   ORCHIECTOMY  [id number]   UMBILICAL HERNIA REPAIR      Family History  Problem Relation Age of Onset   Cancer Mother        jaw bone   Throat cancer Father        smoker    Social History:  reports that he quit smoking about 27 years ago. His smoking use included cigarettes. He has a 4.75 pack-year smoking history. He has never used smokeless tobacco. He reports current alcohol use. He reports that he does not use drugs.The patient is alone today.  Allergies:  No Known Allergies  Current Medications: Current Outpatient Medications  Medication Sig Dispense Refill   tamsulosin (FLOMAX) 0.4 MG CAPS capsule SMARTSIG:1 Tablet(s) By Mouth Every Evening     octreotide (SANDOSTATIN LAR) 20 MG injection Inject 20 mg into the muscle every 28 (twenty-eight) days.     No current facility-administered medications for this visit.      I,Gabriella Ballesteros,acting as a scribe for Derwood Kaplan, MD.,have documented all relevant documentation on the behalf of Derwood Kaplan, MD,as  directed by  Derwood Kaplan, MD while in the presence of Derwood Kaplan, MD.

## 2022-05-28 ENCOUNTER — Inpatient Hospital Stay: Payer: 59

## 2022-05-28 ENCOUNTER — Encounter: Payer: Self-pay | Admitting: Oncology

## 2022-05-28 ENCOUNTER — Other Ambulatory Visit: Payer: Self-pay | Admitting: Oncology

## 2022-05-28 ENCOUNTER — Inpatient Hospital Stay (INDEPENDENT_AMBULATORY_CARE_PROVIDER_SITE_OTHER): Payer: 59 | Admitting: Oncology

## 2022-05-28 VITALS — BP 143/90 | HR 63 | Temp 98.1°F | Resp 18 | Ht 70.0 in | Wt 242.5 lb

## 2022-05-28 DIAGNOSIS — C7A00 Malignant carcinoid tumor of unspecified site: Secondary | ICD-10-CM

## 2022-05-28 DIAGNOSIS — C7B8 Other secondary neuroendocrine tumors: Secondary | ICD-10-CM

## 2022-05-28 DIAGNOSIS — E340 Carcinoid syndrome: Secondary | ICD-10-CM | POA: Diagnosis not present

## 2022-05-28 LAB — CMP (CANCER CENTER ONLY)
ALT: 39 U/L (ref 0–44)
AST: 28 U/L (ref 15–41)
Albumin: 4.1 g/dL (ref 3.5–5.0)
Alkaline Phosphatase: 49 U/L (ref 38–126)
Anion gap: 9 (ref 5–15)
BUN: 18 mg/dL (ref 8–23)
CO2: 25 mmol/L (ref 22–32)
Calcium: 8.8 mg/dL — ABNORMAL LOW (ref 8.9–10.3)
Chloride: 106 mmol/L (ref 98–111)
Creatinine: 1.49 mg/dL — ABNORMAL HIGH (ref 0.61–1.24)
GFR, Estimated: 52 mL/min — ABNORMAL LOW (ref >60)
Glucose, Bld: 129 mg/dL — ABNORMAL HIGH (ref 70–99)
Potassium: 4 mmol/L (ref 3.5–5.1)
Sodium: 140 mmol/L (ref 135–145)
Total Bilirubin: 1 mg/dL (ref 0.3–1.2)
Total Protein: 6.5 g/dL (ref 6.5–8.1)

## 2022-05-28 LAB — CBC WITH DIFFERENTIAL (CANCER CENTER ONLY)
Abs Immature Granulocytes: 0.03 10*3/uL (ref 0.00–0.07)
Basophils Absolute: 0.1 10*3/uL (ref 0.0–0.1)
Basophils Relative: 1 %
Eosinophils Absolute: 0.1 10*3/uL (ref 0.0–0.5)
Eosinophils Relative: 2 %
HCT: 44.8 % (ref 39.0–52.0)
Hemoglobin: 14.9 g/dL (ref 13.0–17.0)
Immature Granulocytes: 1 %
Lymphocytes Relative: 14 %
Lymphs Abs: 0.9 10*3/uL (ref 0.7–4.0)
MCH: 29.4 pg (ref 26.0–34.0)
MCHC: 33.3 g/dL (ref 30.0–36.0)
MCV: 88.4 fL (ref 80.0–100.0)
Monocytes Absolute: 0.4 10*3/uL (ref 0.1–1.0)
Monocytes Relative: 6 %
Neutro Abs: 4.8 10*3/uL (ref 1.7–7.7)
Neutrophils Relative %: 76 %
Platelet Count: 141 10*3/uL — ABNORMAL LOW (ref 150–400)
RBC: 5.07 MIL/uL (ref 4.22–5.81)
RDW: 14.2 % (ref 11.5–15.5)
WBC Count: 6.3 10*3/uL (ref 4.0–10.5)
nRBC: 0 % (ref 0.0–0.2)

## 2022-05-29 ENCOUNTER — Inpatient Hospital Stay: Payer: 59

## 2022-05-29 VITALS — BP 138/84 | HR 68 | Temp 98.3°F | Resp 18 | Ht 70.0 in | Wt 243.1 lb

## 2022-05-29 DIAGNOSIS — E340 Carcinoid syndrome: Secondary | ICD-10-CM | POA: Diagnosis not present

## 2022-05-29 DIAGNOSIS — C7A00 Malignant carcinoid tumor of unspecified site: Secondary | ICD-10-CM

## 2022-05-29 DIAGNOSIS — C7B8 Other secondary neuroendocrine tumors: Secondary | ICD-10-CM

## 2022-05-29 MED ORDER — OCTREOTIDE ACETATE 30 MG IM KIT
30.0000 mg | PACK | Freq: Once | INTRAMUSCULAR | Status: AC
  Administered 2022-05-29: 30 mg via INTRAMUSCULAR
  Filled 2022-05-29: qty 1

## 2022-05-29 NOTE — Patient Instructions (Signed)
Octreotide Injection Solution What is this medication? OCTREOTIDE (ok TREE oh tide) treats high levels of growth hormone (acromegaly). It works by reducing the amount of growth hormone your body makes. This reduces symptoms and the risk of health problems caused by too much growth hormone, such as diabetes and heart disease. It may also be used to treat diarrhea caused by neuroendocrine tumors. It works by slowing down the release of serotonin from the tumor cells. This reduces the number of bowel movements you have. This medicine may be used for other purposes; ask your health care provider or pharmacist if you have questions. COMMON BRAND NAME(S): Bynfezia, Sandostatin What should I tell my care team before I take this medication? They need to know if you have any of these conditions: Diabetes Gallbladder disease Kidney disease Liver disease Thyroid disease An unusual or allergic reaction to octreotide, other medications, foods, dyes, or preservatives Pregnant or trying to get pregnant Breast-feeding How should I use this medication? This medication is injected under the skin or into a vein. It is usually given by your care team in a hospital or clinic setting. If you get this medication at home, you will be taught how to prepare and give it. Use exactly as directed. Take it as directed on the prescription label at the same time every day. Keep taking it unless your care team tells you to stop. Allow the injection solution to come to room temperature before use. Do not warm it artificially. It is important that you put your used needles and syringes in a special sharps container. Do not put them in a trash can. If you do not have a sharps container, call your pharmacist or care team to get one. Talk to your care team about the use of this medication in children. Special care may be needed. Overdosage: If you think you have taken too much of this medicine contact a poison control center or  emergency room at once. NOTE: This medicine is only for you. Do not share this medicine with others. What if I miss a dose? If you miss a dose, take it as soon as you can. If it is almost time for your next dose, take only that dose. Do not take double or extra doses. What may interact with this medication? Bromocriptine Certain medications for blood pressure, heart disease, irregular heartbeat Cyclosporine Diuretics Medications for diabetes, including insulin Quinidine This list may not describe all possible interactions. Give your health care provider a list of all the medicines, herbs, non-prescription drugs, or dietary supplements you use. Also tell them if you smoke, drink alcohol, or use illegal drugs. Some items may interact with your medicine. What should I watch for while using this medication? Visit your care team for regular checks on your progress. Tell your care team if your symptoms do not start to get better or if they get worse. To help reduce irritation at the injection site, use a different site for each injection and make sure the solution is at room temperature before use. This medication may cause decreases in blood sugar. Signs of low blood sugar include chills, cool, pale skin or cold sweats, drowsiness, extreme hunger, fast heartbeat, headache, nausea, nervousness or anxiety, shakiness, trembling, unsteadiness, tiredness, or weakness. Contact your care team right away if you experience any of these symptoms. This medication may increase blood sugar. The risk may be higher in patients who already have diabetes. Ask your care team what you can do to lower your   risk of diabetes while taking this medication. You should make sure you get enough vitamin B12 while you are taking this medication. Discuss the foods you eat and the vitamins you take with your care team. What side effects may I notice from receiving this medication? Side effects that you should report to your care  team as soon as possible: Allergic reactions--skin rash, itching, hives, swelling of the face, lips, tongue, or throat Gallbladder problems--severe stomach pain, nausea, vomiting, fever Heart rhythm changes--fast or irregular heartbeat, dizziness, feeling faint or lightheaded, chest pain, trouble breathing High blood sugar (hyperglycemia)--increased thirst or amount of urine, unusual weakness or fatigue, blurry vision Low blood sugar (hypoglycemia)--tremors or shaking, anxiety, sweating, cold or clammy skin, confusion, dizziness, rapid heartbeat Low thyroid levels (hypothyroidism)--unusual weakness or fatigue, increased sensitivity to cold, constipation, hair loss, dry skin, weight gain, feelings of depression Low vitamin B12 level--pain, tingling, or numbness in the hands or feet, muscle weakness, dizziness, confusion, trouble concentrating Pancreatitis--severe stomach pain that spreads to your back or gets worse after eating or when touched, fever, nausea, vomiting Side effects that usually do not require medical attention (report to your care team if they continue or are bothersome): Diarrhea Dizziness Gas Headache Pain, redness, or irritation at injection site Stomach pain This list may not describe all possible side effects. Call your doctor for medical advice about side effects. You may report side effects to FDA at 1-800-FDA-1088. Where should I keep my medication? Keep out of the reach of children and pets. Store in the refrigerator. Protect from light. Allow to come to room temperature naturally. Do not use artificial heat. If protected from light, the injection may be stored between 20 and 30 degrees C (70 and 86 degrees F) for 14 days. After the initial use, throw away any unused portion of a multiple dose vial after 14 days. Get rid of any unused portions of the ampules after use. To get rid of medications that are no longer needed or have expired: Take the medication to a medication  take-back program. Ask your pharmacy or law enforcement to find a location. If you cannot return the medication, ask your pharmacist or care team how to get rid of the medication safely. NOTE: This sheet is a summary. It may not cover all possible information. If you have questions about this medicine, talk to your doctor, pharmacist, or health care provider.  2023 Elsevier/Gold Standard (2021-07-18 00:00:00)

## 2022-06-03 ENCOUNTER — Telehealth: Payer: Self-pay

## 2022-06-03 NOTE — Telephone Encounter (Signed)
Attempted to contact patient. No answer. 

## 2022-06-03 NOTE — Telephone Encounter (Signed)
-----   Message from Derwood Kaplan, MD sent at 05/31/2022  2:03 PM EST ----- Regarding: call Tell him labs look good, kidney function better, not normal though

## 2022-06-10 ENCOUNTER — Encounter: Payer: Self-pay | Admitting: Oncology

## 2022-06-10 NOTE — Progress Notes (Signed)
[place]  [address] Glenn [place]ender,  [place]  [address] [phone]   Clinic Day:  05/28/22   Referring physician: Carvel Getting Bender, *   ASSESSMENT & PLAN:    Assessment & Plan: Malignant carcinoid tumor of unknown primary site [facility]) Metastatic carcinoid tumor consistent with gastrointestinal origin in September 2021.  Glenn Bender does have a terminal ileum mass measuring 4.0 x 2.8 x 3.7 cm, and a mass of the mesentery of the small intestine measuring 3.9 x 2.3 x 4.0 cm.  As this has already metastasized to the liver, surgical resection was not indicated.  24 hour urine for 5 HIAA was quite elevated at 86.8 consistent with carcinoid syndrome.  Chromogranin A was also elevated at 214. Glenn Bender started treatment with octreotide injections every 4 weeks in October 2021.  Due to the persistent diarrhea, we increased the octreotide to 30 mg every 4 weeks. CT imaging in August 2022 was stable.  The chromogranin A from November 2022 was down to 118.6, but has been increasing this year.  CT scan in April 2023 remained stable.  The chromogranin A was 152 in July.  The patient was hospitalized in September. CT abdomen pelvis at that time revealed small-bowel obstruction, with transition point in the mid to distal jejunum adjacent to the known mesenteric carcinoid tumor. There is a stable, 3.9 cm, calcified central mesenteric mass, consistent with carcinoid tumor, and numerous liver masses, consistent with metastatic carcinoid tumor. The index lesions are not appreciably changed since prior exam. The chromogranin A was up to 169 in October of 2023, but went down to 145.4 in December 2023.   Secondary neuroendocrine tumor of liver ([place]) Multiple liver masses, the largest measuring 4.0 x 4.3 cm, consistent with metastatic carcinoid with carcinoid syndrome, which were stable on CT imaging in September of 2023.         Plan: We will continue with treatment tomorrow, I will see him  back in 4 weeks with CBC and CMP for his next dose of octreotide. The patient understands the plans discussed today and is in agreement with them.  Glenn Bender knows to contact our office if Glenn Bender develops concerns prior to his next appointment.     I provided 15 minutes of face-to-face time during this encounter and > 50% was spent counseling as documented under my assessment and plan.      Glenn Kaplan, MD  [place] [address] [place] [address] Dept: [phone] Dept Fax: [phone]    No orders of the defined types were placed in this encounter.       CHIEF COMPLAINT:  CC: Metastatic carcinoid   Current Treatment: Octreotide 30 mg every 4 weeks   HISTORY OF PRESENT ILLNESS:  Glenn Bender is a 62 y.o. male with metastatic carcinoid to liver diagnosed in September 2021.  The patient presented in September with hematuria, felt to be due to a kidney stone.  CT abdomen and pelvis revealed multiple liver masses, the largest measuring 4.0 x 4.3 cm, a terminal ileum mass measuring 4.0 x 2.8 x 3.7 cm, and a mass of the mesentery of the small intestine measuring 3.9 x 2.3 x 4.0 cm.  Diagnostic colonoscopy with Dr. Coralie Bender in September revealed a large mucosal covered mass protruding through the ileocecal valve with peristalsis.  Biopsy was collected and surgical pathology was benign.  Liver biopsy revealed metastatic neuroendocrine tumor, grade 2, consistent with  gastrointestinal primary. Synaptophysin and chromogranin-A immunostains were positive as well as CDX-2 supporting gastrointestinal origin.  Ki67 was 6%.  Glenn Bender reports flushing of his face with alcohol and certain foods.  Glenn Bender reported diarrhea, as well as flushing.  24 hour urine for 5HIAA was also elevated at 86.8, which is consistent with carcinoid.  Chromogranin A was elevated at 214 in October and came down to 116.9 in December.  CT chest from November 2021 revealed  left paratracheal adenopathy and pleural nodularity along the right hemidiaphragm measuring up to 12 mm, worrisome for metastatic disease. As Glenn Bender had metastatic disease, resection of his primary was not recommended. The patient was started on octreotide 20 mg IM monthly in October.  Octreotide was subsequently increased to 30 mg monthly in December due to persistent diarrhea.   CT chest, abdomen and pelvis in February 2022 revealed mostly stable to slightly decreased appearance of the carcinoid tumor, including the mesenteric lesion with cicatricial reaction, adjacent wall.  There was increased nodularity in a bandlike fashion along the mesenteric vessels adjacent to the carcinoid tumor. The liver lesions were stable with no new lesions identified. There was stable nodularity/lobularity along the right hemidiaphragm.  Glenn Bender has had a continued decrease in the Chromogranin A, which was 111.4 in February, and then 100.3 in March, which is within normal limits.  Glenn Bender had improvement in his diarrhea with the increase in octreotide. The chromogranin A was up slightly in May to 126. CT chest, abdomen and pelvis in August revealed no new or progressive metastatic disease.  Foci of masslike wall thickening within a central right abdominal small bowel loop and at the terminal ileum are stable. Calcified central mesenteric adenopathy is stable. Findings are suggestive of multifocal small bowel carcinoid with conglomerate mesenteric nodal metastases.  Widespread hyperenhancing liver metastases are stable.  Stable clustered subpleural solid pulmonary nodules along the right hemidiaphragm in the basilar right lower lobe are compatible with pulmonary metastases.  There is new background diffuse hepatic steatosis. Chromogranin A was down to 107.5 in August.  The chromogranin A was 118 in November.   CT abdomen and pelvis in April 2023 revealed stable hepatic metastatic disease and stable hepatic steatosis.  There was a stable 3.6 cm  partially calcified mesenteric mass, consistent with carcinoid tumor.  No evidence of new or progressive metastatic disease.  Bilateral nephrolithiasis, a small left inguinal hernia and aortic atherosclerosis were also seen.  Glenn Bender was hospitalized in September at [facility] with a small bowel obstruction.  CT abdomen and pelvis at that time revealed small-bowel obstruction, with transition point in the mid to distal jejunum adjacent to the known mesenteric carcinoid tumor. There was a stable calcified central mesenteric mass consistent with carcinoid tumor, and numerous liver masses, consistent with metastatic carcinoid tumor. The index lesions are not appreciably changed since prior exam. Hepatic steatosis, a non-obstructing left renal calculus and aortic atherosclerosis were seen.  We have continued octreotide 30 mg every 4 weeks with good control of his diarrhea.   INTERVAL HISTORY:  Glenn Bender is here today for repeat clinical assessment and states Glenn Bender has been doing well. Glenn Bender notes that Glenn Bender still has episodes of flushing. Glenn Bender notes that his bowels are loose. Glenn Bender denies nausea, vomiting, constipation or abdominal pain.  Glenn Bender denies sore throat, shortness of breath or chest pain.  Glenn Bender denies fevers or chills. Glenn Bender denies pain. His appetite is good. His weight is remaining stable.    REVIEW OF SYSTEMS:  Review of Systems  Constitutional:  Negative for appetite change, chills, fatigue, fever and unexpected weight change.  HENT:   Negative for lump/mass, mouth sores and sore throat.   Respiratory:  Negative for shortness of breath.   Cardiovascular:  Negative for chest pain and leg swelling.  Gastrointestinal:  Negative for abdominal pain, constipation, diarrhea, nausea and vomiting.  Genitourinary:  Negative for difficulty urinating, dysuria, frequency and hematuria.   Musculoskeletal:  Negative for arthralgias, back pain and myalgias.  Skin:  Negative for itching, rash and wound.  Neurological:  Negative for dizziness,  extremity weakness, headaches, light-headedness and numbness.  Hematological:  Negative for adenopathy.  Psychiatric/Behavioral:  Negative for depression and sleep disturbance. The patient is not nervous/anxious.       VITALS:  Blood pressure (!) 143/90, pulse 63, temperature 98.1 F (36.7 C), temperature source Oral, resp. rate 18, height 5' 10"$  (1.778 m), weight 242 lb 8 oz (110 kg), SpO2 100 %.     Wt Readings from Last 3 Encounters:  05/29/22 243 lb 1.9 oz (110.3 kg)  05/28/22 242 lb 8 oz (110 kg)  05/01/22 241 lb 1 oz (109.3 kg)    Body mass index is 34.8 kg/m.   Performance status (ECOG): 1 - Symptomatic but completely ambulatory   PHYSICAL EXAM:  Physical Exam Vitals and nursing note reviewed.  Constitutional:      General: Glenn Bender is not in acute distress.    Appearance: Normal appearance. Glenn Bender is normal weight.  HENT:     Head: Normocephalic and atraumatic.     Mouth/Throat:     Mouth: Mucous membranes are moist.     Pharynx: Oropharynx is clear. No oropharyngeal exudate or posterior oropharyngeal erythema.  Eyes:     General: No scleral icterus.    Extraocular Movements: Extraocular movements intact.     Conjunctiva/sclera: Conjunctivae normal.     Pupils: Pupils are equal, round, and reactive to light.  Cardiovascular:     Rate and Rhythm: Normal rate and regular rhythm.     Heart sounds: Normal heart sounds. No murmur heard.    No friction rub. No gallop.  Pulmonary:     Effort: Pulmonary effort is normal.     Breath sounds: Normal breath sounds. No wheezing, rhonchi or rales.  Abdominal:     General: Bowel sounds are normal. There is no distension.     Palpations: Abdomen is soft. There is no hepatomegaly, splenomegaly or mass.     Tenderness: There is no abdominal tenderness.     Comments: Liver is about 15 cm below the right coastal margin.   Musculoskeletal:        General: Normal range of motion.     Cervical back: Normal range of motion and neck supple. No  tenderness.     Right lower leg: No edema.     Left lower leg: No edema.  Lymphadenopathy:     Cervical: No cervical adenopathy.     Upper Body:     Right upper body: No supraclavicular or axillary adenopathy.     Left upper body: No supraclavicular or axillary adenopathy.     Lower Body: No right inguinal adenopathy. No left inguinal adenopathy.  Skin:    General: Skin is warm and dry.     Coloration: Skin is not jaundiced.     Findings: No rash.  Neurological:     Mental Status: Glenn Bender is alert and oriented to person, place, and time.     Cranial Nerves: No cranial nerve deficit.  Psychiatric:  Mood and Affect: Mood normal.        Behavior: Behavior normal.        Thought Content: Thought content normal.        LABS:        Latest Ref Rng & Units 05/28/2022   11:24 AM 04/30/2022   11:03 AM 04/02/2022    2:33 PM  CBC  WBC 4.0 - 10.5 K/uL 6.3  6.0  7.1   Hemoglobin 13.0 - 17.0 g/dL 14.9  15.3  14.3   Hematocrit 39.0 - 52.0 % 44.8  46.6  43.2   Platelets 150 - 400 K/uL 141  161  162         Latest Ref Rng & Units 05/28/2022   11:24 AM 04/30/2022   11:03 AM 04/02/2022    2:33 PM  CMP  Glucose 70 - 99 mg/dL 129  88  97   BUN 8 - 23 mg/dL 18  19  18   $ Creatinine 0.61 - 1.24 mg/dL 1.49  1.85  1.57   Sodium 135 - 145 mmol/L 140  142  142   Potassium 3.5 - 5.1 mmol/L 4.0  4.1  4.1   Chloride 98 - 111 mmol/L 106  107  111   CO2 22 - 32 mmol/L 25  25  24   $ Calcium 8.9 - 10.3 mg/dL 8.8  8.9  8.8   Total Protein 6.5 - 8.1 g/dL 6.5  7.2  6.7   Total Bilirubin 0.3 - 1.2 mg/dL 1.0  0.8  1.0   Alkaline Phos 38 - 126 U/L 49  52  60   AST 15 - 41 U/L 28  33  31   ALT 0 - 44 U/L 39  45  43         Recent Labs  No results found for: "CEA1", "CEA"   /  Last Labs  No results found for: "CEA1", "CEA"   Recent Labs  No results found for: "PSA1"   Recent Labs  No results found for: "[id number]"   Recent Labs  No results found for: "CAN125"    Recent Labs  No results found for:  "TOTALPROTELP", "ALBUMINELP", "A1GS", "A2GS", "BETS", "BETA2SER", "GAMS", "MSPIKE", "SPEI"   Recent Labs  No results found for: "TIBC", "FERRITIN", "IRONPCTSAT"   Recent Labs  No results found for: "LDH"     STUDIES:  Imaging Results  No results found.        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[place])     Secondary malignant carcinoid tumor of liver [facility])             Past Surgical History:  Procedure Laterality Date   CYSTOTOMY   01/2020   IR URETERAL STENT LEFT NEW ACCESS W/O SEP NEPHROSTOMY CATH   06/13/2020   IR URETERAL STENT PLACEMENT EXISTING ACCESS LEFT   06/15/2020   LYMPH NODE BIOPSY Left      supraclavicular   NEPHROLITHOTOMY Left 06/13/2020    Procedure: NEPHROLITHOTOMY PERCUTANEOUS;  Surgeon: Robley Fries, MD;  Location: [facility];  Service: Urology;  Laterality: Left;  2 HRS   ORCHIECTOMY   [id number]   UMBILICAL HERNIA REPAIR               Family History  Problem Relation Age of Onset   Cancer Mother          jaw bone   Throat cancer Father          smoker      Social History:  reports that Glenn Bender quit smoking about 27 years ago. His smoking use included cigarettes. Glenn Bender has a 4.75 pack-year smoking history. Glenn Bender has never used smokeless tobacco. Glenn Bender reports current alcohol use. Glenn Bender reports that Glenn Bender does not use drugs.The patient is alone today.   Allergies: No Known Allergies   Current Medications:       Current Outpatient Medications  Medication Sig Dispense Refill   tamsulosin (FLOMAX) 0.4 MG CAPS capsule SMARTSIG:1 Tablet(s) By Mouth Every Evening       octreotide (SANDOSTATIN LAR) 20 MG injection Inject 20 mg into the muscle every 28 (twenty-eight)  days.        No current facility-administered medications for this visit.          I,Gabriella Ballesteros,acting as a scribe for Glenn Kaplan, MD.,have documented all relevant documentation on the behalf of Glenn Kaplan, MD,as directed by  Glenn Kaplan, MD while in the presence of Glenn Kaplan, MD.

## 2022-06-20 NOTE — Progress Notes (Signed)
[place]  [address] [place],  [place]  [address] [phone]   Clinic Day:  06/25/22    Referring physician: Carvel Getting Key, *   ASSESSMENT & PLAN:    Assessment & Plan: Malignant carcinoid tumor of unknown primary site [facility]) Metastatic carcinoid tumor consistent with gastrointestinal origin in September 2021.  He does have a terminal ileum mass measuring 4.0 x 2.8 x 3.7 cm, and a mass of the mesentery of the small intestine measuring 3.9 x 2.3 x 4.0 cm.  As this has already metastasized to the liver, surgical resection was not indicated.  24 hour urine for 5 HIAA was quite elevated at 86.8 consistent with carcinoid syndrome.  Chromogranin A was also elevated at 214. He started treatment with octreotide injections every 4 weeks in October 2021.  Due to the persistent diarrhea, we increased the octreotide to 30 mg every 4 weeks. CT imaging in August 2022 was stable.  The chromogranin A from November 2022 was down to 118.6, but has been mildly increasing this year.  CT scan in April 2023 remained stable.  The chromogranin A was 152 in July.  The patient was hospitalized in September. CT abdomen pelvis at that time revealed small-bowel obstruction, with transition point in the mid to distal jejunum adjacent to the known mesenteric carcinoid tumor. There is a stable, 3.9 cm, calcified central mesenteric mass, consistent with carcinoid tumor, and numerous liver masses, consistent with metastatic carcinoid tumor. The index lesions are not appreciably changed since prior exam. The chromogranin A was up to 169 in October of 2023, but went down to 145.4 in December 2023.   Secondary neuroendocrine tumor of liver ([place]) Multiple liver masses, the largest measuring 4.0 x 4.3 cm, consistent with metastatic carcinoid with carcinoid syndrome, which were stable on CT imaging in September of 2023.         Plan: We will continue with treatment tomorrow, I will  see him back in 4 weeks with CBC, CMP, and chromogranin A level for his next dose of octreotide. We will plan to rescan in the Spring. The patient understands the plans discussed today and is in agreement with them.  He knows to contact our office if he develops concerns prior to his next appointment.     I provided 15 minutes of face-to-face time during this encounter and > 50% was spent counseling as documented under my assessment and plan.      Derwood Kaplan, MD  [place] [address] [place] [address] Dept: [phone] Dept Fax: [phone]    No orders of the defined types were placed in this encounter.       CHIEF COMPLAINT:  CC: Metastatic carcinoid   Current Treatment: Octreotide 30 mg every 4 weeks   HISTORY OF PRESENT ILLNESS:  Glenn Bender is a 62 y.o. male with metastatic carcinoid to liver diagnosed in September 2021.  The patient presented in September with hematuria, felt to be due to a kidney stone.  CT abdomen and pelvis revealed multiple liver masses, the largest measuring 4.0 x 4.3 cm, a terminal ileum mass measuring 4.0 x 2.8 x 3.7 cm, and a mass of the mesentery of the small intestine measuring 3.9 x 2.3 x 4.0 cm.  Diagnostic colonoscopy with Dr. Coralie Keens in September revealed a large mucosal covered mass protruding through the ileocecal valve with peristalsis.  Biopsy was collected and surgical pathology  was benign.  Liver biopsy revealed metastatic neuroendocrine tumor, grade 2, consistent with gastrointestinal primary. Synaptophysin and chromogranin-A immunostains were positive as well as CDX-2 supporting gastrointestinal origin.  Ki67 was 6%.  He reports flushing of his face with alcohol and certain foods.  He also reported diarrhea.  24 hour urine for 5HIAA was also elevated at 86.8, which is consistent with carcinoid.  Chromogranin A was elevated at 214 in October and came down to 116.9  in December.  CT chest from November 2021 revealed left paratracheal adenopathy and pleural nodularity along the right hemidiaphragm measuring up to 12 mm, worrisome for metastatic disease. As he had metastatic disease, resection of his primary was not recommended. The patient was started on octreotide 20 mg IM monthly in October.  Octreotide was subsequently increased to 30 mg monthly in December due to persistent diarrhea.   CT chest, abdomen and pelvis in February 2022 revealed mostly stable to slightly decreased appearance of the carcinoid tumor, including the mesenteric lesion.  The liver lesions were stable with no new lesions. There was stable nodularity/lobularity along the right hemidiaphragm.  He has had a continued decrease in the Chromogranin A, which was 111.4 in February, and then 100.3 in March, which is within normal limits.  He had improvement in his diarrhea with the increase in octreotide. The chromogranin A was up slightly in May to 126. CT chest, abdomen and pelvis in August revealed no new or progressive metastatic disease.  Findings are suggestive of multifocal small bowel carcinoid with conglomerate mesenteric nodal metastases.  Widespread hyperenhancing liver metastases are stable.  Stable clustered subpleural solid pulmonary nodules along the right hemidiaphragm in the basilar right lower lobe are compatible with pulmonary metastases.  There is new background diffuse hepatic steatosis. Chromogranin A was down to 107.5 in August.  The chromogranin A was 118 in November.   CT abdomen and pelvis in April 2023 revealed stable hepatic metastatic disease and stable hepatic steatosis.  There was a stable 3.6 cm partially calcified mesenteric mass, consistent with carcinoid tumor.  No evidence of new or progressive metastatic disease.  Bilateral nephrolithiasis, a small left inguinal hernia and aortic atherosclerosis were also seen.  He was hospitalized in September at [facility] with a small  bowel obstruction.  CT abdomen and pelvis at that time revealed small-bowel obstruction, with transition point in the mid to distal jejunum adjacent to the known mesenteric carcinoid tumor. There was a stable calcified central mesenteric mass consistent with carcinoid tumor, and numerous liver masses, consistent with metastatic carcinoid tumor. The index lesions are not appreciably changed since prior exam. Hepatic steatosis, a non-obstructing left renal calculus and aortic atherosclerosis were seen.  We have continued octreotide 30 mg every 4 weeks with good control of his diarrhea.   INTERVAL HISTORY:  Glenn Bender is here today for repeat clinical assessment and states he has been doing well. His last chromogranin A came down to 145. He denies nausea, vomiting, constipation or abdominal pain.  He denies sore throat, shortness of breath or chest pain.  He denies fevers or chills. He denies pain. His appetite is good. His weight has decreased about 2 pounds since his last visit.   REVIEW OF SYSTEMS:  Review of Systems  Constitutional:  Negative for appetite change, chills, fatigue, fever and unexpected weight change.  HENT:   Negative for lump/mass, mouth sores and sore throat.   Respiratory:  Negative for shortness of breath.   Cardiovascular:  Negative for chest pain and leg  swelling.  Gastrointestinal:  Negative for abdominal pain, constipation, diarrhea, nausea and vomiting.  Genitourinary:  Negative for difficulty urinating, dysuria, frequency and hematuria.   Musculoskeletal:  Negative for arthralgias, back pain and myalgias.  Skin:  Negative for itching, rash and wound.  Neurological:  Negative for dizziness, extremity weakness, headaches, light-headedness and numbness.  Hematological:  Negative for adenopathy.  Psychiatric/Behavioral:  Negative for depression and sleep disturbance. The patient is not nervous/anxious.       VITALS:  Blood pressure (!) 143/90, pulse 63, temperature 98.1 F (36.7  C), temperature source Oral, resp. rate 18, height '5\' 10"'$  (1.778 m), weight 242 lb 8 oz (110 kg), SpO2 100 %.     Wt Readings from Last 3 Encounters:  05/29/22 243 lb 1.9 oz (110.3 kg)  05/28/22 242 lb 8 oz (110 kg)  05/01/22 241 lb 1 oz (109.3 kg)    Body mass index is 34.8 kg/m.   Performance status (ECOG): 1 - Symptomatic but completely ambulatory   PHYSICAL EXAM:  Physical Exam Vitals and nursing note reviewed.  Constitutional:      General: He is not in acute distress.    Appearance: Normal appearance. He is normal weight.  HENT:     Head: Normocephalic and atraumatic.     Mouth/Throat:     Mouth: Mucous membranes are moist.     Pharynx: Oropharynx is clear. No oropharyngeal exudate or posterior oropharyngeal erythema.  Eyes:     General: No scleral icterus.    Extraocular Movements: Extraocular movements intact.     Conjunctiva/sclera: Conjunctivae normal.     Pupils: Pupils are equal, round, and reactive to light.  Cardiovascular:     Rate and Rhythm: Normal rate and regular rhythm.     Heart sounds: Normal heart sounds. No murmur heard.    No friction rub. No gallop.  Pulmonary:     Effort: Pulmonary effort is normal.     Breath sounds: Normal breath sounds. No wheezing, rhonchi or rales.  Abdominal:     General: Bowel sounds are normal. There is no distension.     Palpations: Abdomen is soft. There is no hepatomegaly, splenomegaly or mass.     Tenderness: There is no abdominal tenderness.     Comments: Liver is mildly enlarged below the right coastal margin. Musculoskeletal:        General: Normal range of motion.     Cervical back: Normal range of motion and neck supple. No tenderness.     Right lower leg: No edema.     Left lower leg: No edema.  Lymphadenopathy:     Cervical: No cervical adenopathy.     Upper Body:     Right upper body: No supraclavicular or axillary adenopathy.     Left upper body: No supraclavicular or axillary adenopathy.     Lower  Body: No right inguinal adenopathy. No left inguinal adenopathy.  Skin:    General: Skin is warm and dry.     Coloration: Skin is not jaundiced.     Findings: No rash.  Neurological:     Mental Status: He is alert and oriented to person, place, and time.     Cranial Nerves: No cranial nerve deficit.  Psychiatric:        Mood and Affect: Mood normal.        Behavior: Behavior normal.        Thought Content: Thought content normal.        LABS:  Latest Ref Rng & Units 05/28/2022   11:24 AM 04/30/2022   11:03 AM 04/02/2022    2:33 PM  CBC  WBC 4.0 - 10.5 K/uL 6.3  6.0  7.1   Hemoglobin 13.0 - 17.0 g/dL 14.9  15.3  14.3   Hematocrit 39.0 - 52.0 % 44.8  46.6  43.2   Platelets 150 - 400 K/uL 141  161  162         Latest Ref Rng & Units 05/28/2022   11:24 AM 04/30/2022   11:03 AM 04/02/2022    2:33 PM  CMP  Glucose 70 - 99 mg/dL 129  88  97   BUN 8 - 23 mg/dL '18  19  18   '$ Creatinine 0.61 - 1.24 mg/dL 1.49  1.85  1.57   Sodium 135 - 145 mmol/L 140  142  142   Potassium 3.5 - 5.1 mmol/L 4.0  4.1  4.1   Chloride 98 - 111 mmol/L 106  107  111   CO2 22 - 32 mmol/L '25  25  24   '$ Calcium 8.9 - 10.3 mg/dL 8.8  8.9  8.8   Total Protein 6.5 - 8.1 g/dL 6.5  7.2  6.7   Total Bilirubin 0.3 - 1.2 mg/dL 1.0  0.8  1.0   Alkaline Phos 38 - 126 U/L 49  52  60   AST 15 - 41 U/L 28  33  31   ALT 0 - 44 U/L 39  45  43         Recent Labs  No results found for: "CEA1", "CEA"   /  Last Labs  No results found for: "CEA1", "CEA"   Recent Labs  No results found for: "PSA1"   Recent Labs  No results found for: "[id number]"   Recent Labs  No results found for: "CAN125"    Recent Labs  No results found for: "TOTALPROTELP", "ALBUMINELP", "A1GS", "A2GS", "BETS", "BETA2SER", "GAMS", "MSPIKE", "SPEI"   Recent Labs  No results found for: "TIBC", "FERRITIN", "IRONPCTSAT"   Recent Labs  No results found for: "LDH"     STUDIES:  Imaging Results  No results found.        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[place])     Secondary malignant carcinoid tumor of liver [facility])             Past Surgical History:  Procedure Laterality Date   CYSTOTOMY   01/2020   IR URETERAL STENT LEFT NEW ACCESS W/O SEP NEPHROSTOMY CATH   06/13/2020   IR URETERAL STENT PLACEMENT EXISTING ACCESS LEFT   06/15/2020   LYMPH NODE BIOPSY Left      supraclavicular   NEPHROLITHOTOMY Left 06/13/2020    Procedure: NEPHROLITHOTOMY PERCUTANEOUS;  Surgeon: Robley Fries, MD;  Location: [facility];  Service: Urology;  Laterality: Left;  2 HRS   ORCHIECTOMY   [id number]   UMBILICAL HERNIA REPAIR               Family History  Problem Relation Age of Onset   Cancer  Mother          jaw bone   Throat cancer Father          smoker      Social History:  reports that he quit smoking about 27 years ago. His smoking use included cigarettes. He has a 4.75 pack-year smoking history. He has never used smokeless tobacco. He reports current alcohol use. He reports that he does not use drugs.The patient is alone today.   Allergies: No Known Allergies   Current Medications:       Current Outpatient Medications  Medication Sig Dispense Refill   tamsulosin (FLOMAX) 0.4 MG CAPS capsule SMARTSIG:1 Tablet(s) By Mouth Every Evening       octreotide (SANDOSTATIN LAR) 20 MG injection Inject 20 mg into the muscle every 28 (twenty-eight) days.        No current facility-administered medications for this visit.          I,Gabriella Ballesteros,acting as a scribe for Derwood Kaplan, MD.,have documented all relevant documentation on the behalf of Derwood Kaplan, MD,as directed by  Derwood Kaplan, MD while in  the presence of Derwood Kaplan, MD.

## 2022-06-25 ENCOUNTER — Other Ambulatory Visit: Payer: Self-pay | Admitting: Oncology

## 2022-06-25 ENCOUNTER — Encounter: Payer: Self-pay | Admitting: Oncology

## 2022-06-25 ENCOUNTER — Inpatient Hospital Stay: Payer: 59 | Attending: Oncology | Admitting: Oncology

## 2022-06-25 ENCOUNTER — Inpatient Hospital Stay: Payer: 59

## 2022-06-25 VITALS — BP 128/88 | HR 69 | Temp 98.0°F | Resp 18 | Ht 70.0 in | Wt 241.1 lb

## 2022-06-25 DIAGNOSIS — E340 Carcinoid syndrome: Secondary | ICD-10-CM | POA: Diagnosis present

## 2022-06-25 DIAGNOSIS — C7A00 Malignant carcinoid tumor of unspecified site: Secondary | ICD-10-CM

## 2022-06-25 DIAGNOSIS — Z79899 Other long term (current) drug therapy: Secondary | ICD-10-CM | POA: Diagnosis not present

## 2022-06-25 DIAGNOSIS — C7B8 Other secondary neuroendocrine tumors: Secondary | ICD-10-CM | POA: Diagnosis not present

## 2022-06-25 DIAGNOSIS — C7B02 Secondary carcinoid tumors of liver: Secondary | ICD-10-CM | POA: Diagnosis present

## 2022-06-25 LAB — CBC WITH DIFFERENTIAL (CANCER CENTER ONLY)
Abs Immature Granulocytes: 0.04 10*3/uL (ref 0.00–0.07)
Basophils Absolute: 0.1 10*3/uL (ref 0.0–0.1)
Basophils Relative: 1 %
Eosinophils Absolute: 0.1 10*3/uL (ref 0.0–0.5)
Eosinophils Relative: 2 %
HCT: 44.6 % (ref 39.0–52.0)
Hemoglobin: 14.7 g/dL (ref 13.0–17.0)
Immature Granulocytes: 1 %
Lymphocytes Relative: 14 %
Lymphs Abs: 0.9 10*3/uL (ref 0.7–4.0)
MCH: 28.9 pg (ref 26.0–34.0)
MCHC: 33 g/dL (ref 30.0–36.0)
MCV: 87.8 fL (ref 80.0–100.0)
Monocytes Absolute: 0.4 10*3/uL (ref 0.1–1.0)
Monocytes Relative: 6 %
Neutro Abs: 4.9 10*3/uL (ref 1.7–7.7)
Neutrophils Relative %: 76 %
Platelet Count: 165 10*3/uL (ref 150–400)
RBC: 5.08 MIL/uL (ref 4.22–5.81)
RDW: 13.4 % (ref 11.5–15.5)
WBC Count: 6.4 10*3/uL (ref 4.0–10.5)
nRBC: 0 % (ref 0.0–0.2)

## 2022-06-25 LAB — CMP (CANCER CENTER ONLY)
ALT: 27 U/L (ref 0–44)
AST: 25 U/L (ref 15–41)
Albumin: 4.3 g/dL (ref 3.5–5.0)
Alkaline Phosphatase: 51 U/L (ref 38–126)
Anion gap: 7 (ref 5–15)
BUN: 23 mg/dL (ref 8–23)
CO2: 26 mmol/L (ref 22–32)
Calcium: 8.7 mg/dL — ABNORMAL LOW (ref 8.9–10.3)
Chloride: 107 mmol/L (ref 98–111)
Creatinine: 1.69 mg/dL — ABNORMAL HIGH (ref 0.61–1.24)
GFR, Estimated: 45 mL/min — ABNORMAL LOW (ref >60)
Glucose, Bld: 110 mg/dL — ABNORMAL HIGH (ref 70–99)
Potassium: 4.3 mmol/L (ref 3.5–5.1)
Sodium: 140 mmol/L (ref 135–145)
Total Bilirubin: 0.9 mg/dL (ref 0.3–1.2)
Total Protein: 6.7 g/dL (ref 6.5–8.1)

## 2022-06-26 ENCOUNTER — Inpatient Hospital Stay: Payer: 59

## 2022-06-26 VITALS — BP 132/82 | HR 64 | Temp 98.2°F | Resp 18 | Wt 241.0 lb

## 2022-06-26 DIAGNOSIS — C7B8 Other secondary neuroendocrine tumors: Secondary | ICD-10-CM

## 2022-06-26 DIAGNOSIS — E340 Carcinoid syndrome: Secondary | ICD-10-CM | POA: Diagnosis not present

## 2022-06-26 DIAGNOSIS — C7A00 Malignant carcinoid tumor of unspecified site: Secondary | ICD-10-CM

## 2022-06-26 MED ORDER — OCTREOTIDE ACETATE 30 MG IM KIT
30.0000 mg | PACK | Freq: Once | INTRAMUSCULAR | Status: AC
  Administered 2022-06-26: 30 mg via INTRAMUSCULAR
  Filled 2022-06-26: qty 1

## 2022-06-26 NOTE — Patient Instructions (Signed)
Octreotide Injection Solution What is this medication? OCTREOTIDE (ok TREE oh tide) treats high levels of growth hormone (acromegaly). It works by reducing the amount of growth hormone your body makes. This reduces symptoms and the risk of health problems caused by too much growth hormone, such as diabetes and heart disease. It may also be used to treat diarrhea caused by neuroendocrine tumors. It works by slowing down the release of serotonin from the tumor cells. This reduces the number of bowel movements you have. This medicine may be used for other purposes; ask your health care provider or pharmacist if you have questions. COMMON BRAND NAME(S): Bynfezia, Sandostatin What should I tell my care team before I take this medication? They need to know if you have any of these conditions: Diabetes Gallbladder disease Kidney disease Liver disease Thyroid disease An unusual or allergic reaction to octreotide, other medications, foods, dyes, or preservatives Pregnant or trying to get pregnant Breast-feeding How should I use this medication? This medication is injected under the skin or into a vein. It is usually given by your care team in a hospital or clinic setting. If you get this medication at home, you will be taught how to prepare and give it. Use exactly as directed. Take it as directed on the prescription label at the same time every day. Keep taking it unless your care team tells you to stop. Allow the injection solution to come to room temperature before use. Do not warm it artificially. It is important that you put your used needles and syringes in a special sharps container. Do not put them in a trash can. If you do not have a sharps container, call your pharmacist or care team to get one. Talk to your care team about the use of this medication in children. Special care may be needed. Overdosage: If you think you have taken too much of this medicine contact a poison control center or  emergency room at once. NOTE: This medicine is only for you. Do not share this medicine with others. What if I miss a dose? If you miss a dose, take it as soon as you can. If it is almost time for your next dose, take only that dose. Do not take double or extra doses. What may interact with this medication? Bromocriptine Certain medications for blood pressure, heart disease, irregular heartbeat Cyclosporine Diuretics Medications for diabetes, including insulin Quinidine This list may not describe all possible interactions. Give your health care provider a list of all the medicines, herbs, non-prescription drugs, or dietary supplements you use. Also tell them if you smoke, drink alcohol, or use illegal drugs. Some items may interact with your medicine. What should I watch for while using this medication? Visit your care team for regular checks on your progress. Tell your care team if your symptoms do not start to get better or if they get worse. To help reduce irritation at the injection site, use a different site for each injection and make sure the solution is at room temperature before use. This medication may cause decreases in blood sugar. Signs of low blood sugar include chills, cool, pale skin or cold sweats, drowsiness, extreme hunger, fast heartbeat, headache, nausea, nervousness or anxiety, shakiness, trembling, unsteadiness, tiredness, or weakness. Contact your care team right away if you experience any of these symptoms. This medication may increase blood sugar. The risk may be higher in patients who already have diabetes. Ask your care team what you can do to lower your   risk of diabetes while taking this medication. You should make sure you get enough vitamin B12 while you are taking this medication. Discuss the foods you eat and the vitamins you take with your care team. What side effects may I notice from receiving this medication? Side effects that you should report to your care  team as soon as possible: Allergic reactions--skin rash, itching, hives, swelling of the face, lips, tongue, or throat Gallbladder problems--severe stomach pain, nausea, vomiting, fever Heart rhythm changes--fast or irregular heartbeat, dizziness, feeling faint or lightheaded, chest pain, trouble breathing High blood sugar (hyperglycemia)--increased thirst or amount of urine, unusual weakness or fatigue, blurry vision Low blood sugar (hypoglycemia)--tremors or shaking, anxiety, sweating, cold or clammy skin, confusion, dizziness, rapid heartbeat Low thyroid levels (hypothyroidism)--unusual weakness or fatigue, increased sensitivity to cold, constipation, hair loss, dry skin, weight gain, feelings of depression Low vitamin B12 level--pain, tingling, or numbness in the hands or feet, muscle weakness, dizziness, confusion, trouble concentrating Pancreatitis--severe stomach pain that spreads to your back or gets worse after eating or when touched, fever, nausea, vomiting Side effects that usually do not require medical attention (report to your care team if they continue or are bothersome): Diarrhea Dizziness Gas Headache Pain, redness, or irritation at injection site Stomach pain This list may not describe all possible side effects. Call your doctor for medical advice about side effects. You may report side effects to FDA at 1-800-FDA-1088. Where should I keep my medication? Keep out of the reach of children and pets. Store in the refrigerator. Protect from light. Allow to come to room temperature naturally. Do not use artificial heat. If protected from light, the injection may be stored between 20 and 30 degrees C (70 and 86 degrees F) for 14 days. After the initial use, throw away any unused portion of a multiple dose vial after 14 days. Get rid of any unused portions of the ampules after use. To get rid of medications that are no longer needed or have expired: Take the medication to a medication  take-back program. Ask your pharmacy or law enforcement to find a location. If you cannot return the medication, ask your pharmacist or care team how to get rid of the medication safely. NOTE: This sheet is a summary. It may not cover all possible information. If you have questions about this medicine, talk to your doctor, pharmacist, or health care provider.  2023 Elsevier/Gold Standard (2021-07-18 00:00:00)  

## 2022-06-28 ENCOUNTER — Telehealth: Payer: Self-pay

## 2022-06-28 NOTE — Telephone Encounter (Signed)
-----   Message from Derwood Kaplan, MD sent at 06/26/2022  7:41 PM EST ----- Regarding: call Tell him labs okay, just kidney function up and down

## 2022-06-28 NOTE — Telephone Encounter (Signed)
Attempted to contact patient. No answer and no VM. 

## 2022-07-07 ENCOUNTER — Encounter: Payer: Self-pay | Admitting: Oncology

## 2022-07-10 ENCOUNTER — Telehealth: Payer: Self-pay

## 2022-07-10 NOTE — Telephone Encounter (Signed)
Left message about labs kidney function up and down, call with any questions.

## 2022-07-10 NOTE — Telephone Encounter (Signed)
-----   Message from Belva Chimes, LPN sent at 10/16/5091  9:12 AM EDT ----- Regarding: FW: call  ----- Message ----- From: Derwood Kaplan, MD Sent: 06/26/2022   7:41 PM EDT To: Belva Chimes, LPN Subject: call                                           Tell him labs okay, just kidney function up and down

## 2022-07-22 ENCOUNTER — Encounter: Payer: Self-pay | Admitting: Oncology

## 2022-07-22 NOTE — Addendum Note (Signed)
Addended by: Juanetta Beets on: 07/22/2022 01:50 PM   Modules accepted: Orders

## 2022-07-23 ENCOUNTER — Ambulatory Visit: Payer: 59 | Admitting: Hematology and Oncology

## 2022-07-23 ENCOUNTER — Inpatient Hospital Stay (INDEPENDENT_AMBULATORY_CARE_PROVIDER_SITE_OTHER): Payer: 59 | Admitting: Oncology

## 2022-07-23 ENCOUNTER — Other Ambulatory Visit: Payer: 59

## 2022-07-23 ENCOUNTER — Other Ambulatory Visit: Payer: Self-pay | Admitting: Oncology

## 2022-07-23 ENCOUNTER — Encounter: Payer: Self-pay | Admitting: Oncology

## 2022-07-23 ENCOUNTER — Inpatient Hospital Stay: Payer: 59 | Attending: Oncology

## 2022-07-23 VITALS — BP 125/79 | HR 61 | Temp 97.7°F | Resp 20 | Ht 70.0 in | Wt 243.1 lb

## 2022-07-23 DIAGNOSIS — C7A00 Malignant carcinoid tumor of unspecified site: Secondary | ICD-10-CM | POA: Insufficient documentation

## 2022-07-23 DIAGNOSIS — C7B02 Secondary carcinoid tumors of liver: Secondary | ICD-10-CM | POA: Diagnosis present

## 2022-07-23 DIAGNOSIS — E340 Carcinoid syndrome: Secondary | ICD-10-CM | POA: Insufficient documentation

## 2022-07-23 LAB — HEPATIC FUNCTION PANEL
ALT: 42 U/L — AB (ref 10–40)
AST: 32 (ref 14–40)
Alkaline Phosphatase: 42 (ref 25–125)
Bilirubin, Total: 0.9

## 2022-07-23 LAB — CBC: RBC: 5.09 (ref 3.87–5.11)

## 2022-07-23 LAB — BASIC METABOLIC PANEL
BUN: 17 (ref 4–21)
CO2: 24 — AB (ref 13–22)
Chloride: 105 (ref 99–108)
Creatinine: 1.4 — AB (ref 0.6–1.3)
Glucose: 174
Potassium: 4.2 mEq/L (ref 3.5–5.1)
Sodium: 137 (ref 137–147)

## 2022-07-23 LAB — LAB REPORT - SCANNED: EGFR: 51

## 2022-07-23 LAB — CBC AND DIFFERENTIAL
HCT: 44 (ref 41–53)
Hemoglobin: 14.9 (ref 13.5–17.5)
Neutrophils Absolute: 5.77
Platelets: 144 10*3/uL — AB (ref 150–400)
WBC: 7.3

## 2022-07-23 LAB — COMPREHENSIVE METABOLIC PANEL
Albumin: 3.9 (ref 3.5–5.0)
Calcium: 8.3 — AB (ref 8.7–10.7)

## 2022-07-23 LAB — CBC W DIFFERENTIAL (~~LOC~~ CC SCANNED REPORT)

## 2022-07-23 LAB — COMPREHENSIVE METABOLIC PANEL (ASHBORO CC SCANNED REPORT)

## 2022-07-23 NOTE — Progress Notes (Signed)
[place]  [address] [place],  [place]  [address] [phone]   Clinic Day:  06/25/22  Referring physician: Carvel Getting Key, *   ASSESSMENT & PLAN:    Assessment & Plan: Malignant carcinoid tumor of unknown primary site [facility]) Metastatic carcinoid tumor consistent with gastrointestinal origin in September 2021.  He does have a terminal ileum mass measuring 4.0 x 2.8 x 3.7 cm, and a mass of the mesentery of the small intestine measuring 3.9 x 2.3 x 4.0 cm.  As this has already metastasized to the liver, surgical resection was not indicated.  24 hour urine for 5 HIAA was quite elevated at 86.8 consistent with carcinoid syndrome.  Chromogranin A was also elevated at 214. He started treatment with octreotide injections every 4 weeks in October 2021.  Due to the persistent diarrhea, we increased the octreotide to 30 mg every 4 weeks. CT imaging in August 2022 was stable.  The chromogranin A from November 2022 was down to 118.6, but has been mildly increasing this year.  CT scan in April 2023 remained stable.  The chromogranin A was 152 in July.  The patient was hospitalized in September. CT abdomen pelvis at that time revealed small-bowel obstruction, with transition point in the mid to distal jejunum adjacent to the known mesenteric carcinoid tumor. There is a stable, 3.9 cm, calcified central mesenteric mass, consistent with carcinoid tumor, and numerous liver masses, consistent with metastatic carcinoid tumor. The index lesions are not appreciably changed since prior exam. The chromogranin A was up to 169 in October of 2023, but went down to 145.4 in December 2023. Chromogranin A results from today.    Secondary neuroendocrine tumor of liver ([place]) Multiple liver masses, the largest measuring 4.0 x 4.3 cm, consistent with metastatic carcinoid with carcinoid syndrome, which were stable on CT imaging in September of 2023.         Plan:  -Proceed with 30  mg octreotide on Thursday d/t insurance.  -Labs today are adequate for treatment. Pending Chromogranin A.  -Return to clinic in 4 weeks for lab work (CBC, CMP ) and next dose of octreotide. -Plan for reimaging CT abdomen in 1-2 months.  -RTC in 8 weeks to review results, labs, MD assessment and next dose of Octreotide.   I provided 15 minutes of face-to-face time during this encounter and > 50% was spent counseling as documented under my assessment and plan.    Faythe Casa, NP 07/23/2022 12:00 PM   No orders of the defined types were placed in this encounter.    CHIEF COMPLAINT:  CC: Metastatic carcinoid   Current Treatment: Octreotide 30 mg every 4 weeks   HISTORY OF PRESENT ILLNESS:  Glenn Bender is a 62 y.o. male with metastatic carcinoid to liver diagnosed in September 2021.  The patient presented in September with hematuria, felt to be due to a kidney stone.  CT abdomen and pelvis revealed multiple liver masses, the largest measuring 4.0 x 4.3 cm, a terminal ileum mass measuring 4.0 x 2.8 x 3.7 cm, and a mass of the mesentery of the small intestine measuring 3.9 x 2.3 x 4.0 cm.  Diagnostic colonoscopy with Dr. Coralie Keens in September revealed a large mucosal covered mass protruding through the ileocecal valve with peristalsis.  Biopsy was collected and surgical pathology was benign.  Liver biopsy revealed metastatic neuroendocrine tumor, grade 2, consistent with gastrointestinal primary. Synaptophysin and chromogranin-A immunostains were positive as well as CDX-2 supporting gastrointestinal  origin.  Ki67 was 6%.  He reports flushing of his face with alcohol and certain foods.  He also reported diarrhea.  24 hour urine for 5HIAA was also elevated at 86.8, which is consistent with carcinoid.  Chromogranin A was elevated at 214 in October and came down to 116.9 in December.  CT chest from November 2021 revealed left paratracheal adenopathy and pleural nodularity along the right hemidiaphragm  measuring up to 12 mm, worrisome for metastatic disease. As he had metastatic disease, resection of his primary was not recommended. The patient was started on octreotide 20 mg IM monthly in October.  Octreotide was subsequently increased to 30 mg monthly in December due to persistent diarrhea.   CT chest, abdomen and pelvis in February 2022 revealed mostly stable to slightly decreased appearance of the carcinoid tumor, including the mesenteric lesion.  The liver lesions were stable with no new lesions. There was stable nodularity/lobularity along the right hemidiaphragm.  He has had a continued decrease in the Chromogranin A, which was 111.4 in February, and then 100.3 in March, which is within normal limits.  He had improvement in his diarrhea with the increase in octreotide. The chromogranin A was up slightly in May to 126. CT chest, abdomen and pelvis in August revealed no new or progressive metastatic disease.  Findings are suggestive of multifocal small bowel carcinoid with conglomerate mesenteric nodal metastases.  Widespread hyperenhancing liver metastases are stable.  Stable clustered subpleural solid pulmonary nodules along the right hemidiaphragm in the basilar right lower lobe are compatible with pulmonary metastases.  There is new background diffuse hepatic steatosis. Chromogranin A was down to 107.5 in August.  The chromogranin A was 118 in November.   CT abdomen and pelvis in April 2023 revealed stable hepatic metastatic disease and stable hepatic steatosis.  There was a stable 3.6 cm partially calcified mesenteric mass, consistent with carcinoid tumor.  No evidence of new or progressive metastatic disease.  Bilateral nephrolithiasis, a small left inguinal hernia and aortic atherosclerosis were also seen.  He was hospitalized in September at [facility] with a small bowel obstruction.  CT abdomen and pelvis at that time revealed small-bowel obstruction, with transition point in the mid to distal  jejunum adjacent to the known mesenteric carcinoid tumor. There was a stable calcified central mesenteric mass consistent with carcinoid tumor, and numerous liver masses, consistent with metastatic carcinoid tumor. The index lesions are not appreciably changed since prior exam. Hepatic steatosis, a non-obstructing left renal calculus and aortic atherosclerosis were seen.  We have continued octreotide 30 mg every 4 weeks with good control of his diarrhea.   INTERVAL HISTORY:  Glenn Bender is here today for repeat clinical assessment and states he has been doing well. His last chromogranin A came down to 145 (December 2024). He denies nausea, vomiting, constipation or abdominal pain.  He denies sore throat, shortness of breath or chest pain.  He denies fevers or chills. He denies pain. His appetite is good. His weight has increased 2 lbs since his last visit.    REVIEW OF SYSTEMS:  Review of Systems  Constitutional:  Negative for malaise/fatigue and weight loss.  Respiratory:  Negative for cough.   Cardiovascular:  Negative for chest pain.  Gastrointestinal:  Negative for abdominal pain, constipation, diarrhea, nausea and vomiting.  Musculoskeletal:  Negative for myalgias.     VITALS:  Blood pressure (!) 143/90, pulse 63, temperature 98.1 F (36.7 C), temperature source Oral, resp. rate 18, height 5\' 10"  (1.778 m), weight  242 lb 8 oz (110 kg), SpO2 100 %.     Wt Readings from Last 3 Encounters:  05/29/22 243 lb 1.9 oz (110.3 kg)  05/28/22 242 lb 8 oz (110 kg)  05/01/22 241 lb 1 oz (109.3 kg)    Body mass index is 34.8 kg/m.   Performance status (ECOG): 1 - Symptomatic but completely ambulatory   PHYSICAL EXAM:  Physical Exam Constitutional:      Appearance: Normal appearance.  Cardiovascular:     Rate and Rhythm: Normal rate and regular rhythm.  Pulmonary:     Effort: Pulmonary effort is normal.     Breath sounds: Normal breath sounds.  Abdominal:     General: Abdomen is flat. Bowel  sounds are normal.     Palpations: Abdomen is soft. There is hepatomegaly (Mild).  Lymphadenopathy:     Cervical: No cervical adenopathy.  Neurological:     Mental Status: He is alert.          LABS:   CBC    Component Value Date/Time   WBC 7.3 07/23/2022 0000   WBC 6.4 06/25/2022 1105   WBC 9.0 06/15/2020 0454   RBC 5.09 07/23/2022 0000   HGB 14.9 07/23/2022 0000   HGB 14.7 06/25/2022 1105   HCT 44 07/23/2022 0000   PLT 144 (A) 07/23/2022 0000   PLT 165 06/25/2022 1105   MCV 87.8 06/25/2022 1105   MCV 85 09/20/2020 0000   MCH 28.9 06/25/2022 1105   MCHC 33.0 06/25/2022 1105   RDW 13.4 06/25/2022 1105   LYMPHSABS 0.9 06/25/2022 1105   MONOABS 0.4 06/25/2022 1105   EOSABS 0.1 06/25/2022 1105   BASOSABS 0.1 06/25/2022 1105      Latest Ref Rng & Units 07/23/2022   12:00 AM 06/25/2022   11:05 AM 05/28/2022   11:24 AM  CMP  Glucose 70 - 99 mg/dL  110  129   BUN 4 - 21 17     23  18    Creatinine 0.6 - 1.3 1.4     1.69  1.49   Sodium 137 - 147 137     140  140   Potassium 3.5 - 5.1 mEq/L 4.2     4.3  4.0   Chloride 99 - 108 105     107  106   CO2 13 - 22 24     26  25    Calcium 8.7 - 10.7 8.3     8.7  8.8   Total Protein 6.5 - 8.1 g/dL  6.7  6.5   Total Bilirubin 0.3 - 1.2 mg/dL  0.9  1.0   Alkaline Phos 25 - 125 42     51  49   AST 14 - 40 32     25  28   ALT 10 - 40 U/L 42     27  39      This result is from an external source.       STUDIES:  Ct Scan Sept 2023  CT abdomen pelvis at that time revealed small-bowel obstruction, with transition point in the mid to distal jejunum adjacent to the known mesenteric carcinoid tumor. There is a stable, 3.9 cm, calcified central mesenteric mass, consistent with carcinoid tumor, and numerous liver masses, consistent with metastatic carcinoid tumor. The index lesions are not appreciably changed since prior exam.     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[place])     Secondary malignant carcinoid tumor of liver [facility])             Past Surgical History:  Procedure Laterality Date   CYSTOTOMY   01/2020   IR URETERAL STENT LEFT NEW ACCESS W/O SEP NEPHROSTOMY CATH   06/13/2020   IR URETERAL STENT PLACEMENT EXISTING ACCESS LEFT   06/15/2020   LYMPH NODE BIOPSY Left      supraclavicular   NEPHROLITHOTOMY Left 06/13/2020    Procedure: NEPHROLITHOTOMY PERCUTANEOUS;  Surgeon: Robley Fries, MD;  Location: [facility];  Service: Urology;  Laterality: Left;  2 HRS   ORCHIECTOMY   [id number]   UMBILICAL HERNIA REPAIR               Family History  Problem Relation Age of Onset   Cancer Mother          jaw bone   Throat cancer Father          smoker      Social History:  reports that he quit smoking about 27 years ago. His smoking use included cigarettes. He has a 4.75 pack-year smoking history. He has never used smokeless tobacco. He reports current alcohol use. He reports that he does not use drugs.The patient is alone today.   Allergies: No Known Allergies   Current Medications:       Current Outpatient Medications  Medication Sig Dispense Refill   tamsulosin (FLOMAX) 0.4 MG CAPS capsule SMARTSIG:1 Tablet(s) By Mouth Every Evening       octreotide (SANDOSTATIN LAR) 20 MG injection Inject 20 mg into the muscle every 28 (twenty-eight) days.        No current facility-administered medications for this visit.

## 2022-07-24 LAB — CHROMOGRANIN A: Chromogranin A (ng/mL): 124.9 ng/mL — ABNORMAL HIGH (ref 0.0–101.8)

## 2022-07-25 ENCOUNTER — Inpatient Hospital Stay: Payer: 59

## 2022-07-25 VITALS — BP 133/68 | HR 60 | Temp 98.0°F | Resp 18

## 2022-07-25 DIAGNOSIS — E340 Carcinoid syndrome: Secondary | ICD-10-CM | POA: Diagnosis not present

## 2022-07-25 DIAGNOSIS — C7A00 Malignant carcinoid tumor of unspecified site: Secondary | ICD-10-CM

## 2022-07-25 DIAGNOSIS — C7B8 Other secondary neuroendocrine tumors: Secondary | ICD-10-CM

## 2022-07-25 MED ORDER — OCTREOTIDE ACETATE 30 MG IM KIT
30.0000 mg | PACK | Freq: Once | INTRAMUSCULAR | Status: AC
  Administered 2022-07-25: 30 mg via INTRAMUSCULAR

## 2022-07-25 NOTE — Patient Instructions (Signed)
Octreotide Injection Solution What is this medication? OCTREOTIDE (ok TREE oh tide) treats high levels of growth hormone (acromegaly). It works by reducing the amount of growth hormone your body makes. This reduces symptoms and the risk of health problems caused by too much growth hormone, such as diabetes and heart disease. It may also be used to treat diarrhea caused by neuroendocrine tumors. It works by slowing down the release of serotonin from the tumor cells. This reduces the number of bowel movements you have. This medicine may be used for other purposes; ask your health care provider or pharmacist if you have questions. COMMON BRAND NAME(S): Bynfezia, Sandostatin What should I tell my care team before I take this medication? They need to know if you have any of these conditions: Diabetes Gallbladder disease Kidney disease Liver disease Thyroid disease An unusual or allergic reaction to octreotide, other medications, foods, dyes, or preservatives Pregnant or trying to get pregnant Breast-feeding How should I use this medication? This medication is injected under the skin or into a vein. It is usually given by your care team in a hospital or clinic setting. If you get this medication at home, you will be taught how to prepare and give it. Use exactly as directed. Take it as directed on the prescription label at the same time every day. Keep taking it unless your care team tells you to stop. Allow the injection solution to come to room temperature before use. Do not warm it artificially. It is important that you put your used needles and syringes in a special sharps container. Do not put them in a trash can. If you do not have a sharps container, call your pharmacist or care team to get one. Talk to your care team about the use of this medication in children. Special care may be needed. Overdosage: If you think you have taken too much of this medicine contact a poison control center or  emergency room at once. NOTE: This medicine is only for you. Do not share this medicine with others. What if I miss a dose? If you miss a dose, take it as soon as you can. If it is almost time for your next dose, take only that dose. Do not take double or extra doses. What may interact with this medication? Bromocriptine Certain medications for blood pressure, heart disease, irregular heartbeat Cyclosporine Diuretics Medications for diabetes, including insulin Quinidine This list may not describe all possible interactions. Give your health care provider a list of all the medicines, herbs, non-prescription drugs, or dietary supplements you use. Also tell them if you smoke, drink alcohol, or use illegal drugs. Some items may interact with your medicine. What should I watch for while using this medication? Visit your care team for regular checks on your progress. Tell your care team if your symptoms do not start to get better or if they get worse. To help reduce irritation at the injection site, use a different site for each injection and make sure the solution is at room temperature before use. This medication may cause decreases in blood sugar. Signs of low blood sugar include chills, cool, pale skin or cold sweats, drowsiness, extreme hunger, fast heartbeat, headache, nausea, nervousness or anxiety, shakiness, trembling, unsteadiness, tiredness, or weakness. Contact your care team right away if you experience any of these symptoms. This medication may increase blood sugar. The risk may be higher in patients who already have diabetes. Ask your care team what you can do to lower your   risk of diabetes while taking this medication. You should make sure you get enough vitamin B12 while you are taking this medication. Discuss the foods you eat and the vitamins you take with your care team. What side effects may I notice from receiving this medication? Side effects that you should report to your care  team as soon as possible: Allergic reactions--skin rash, itching, hives, swelling of the face, lips, tongue, or throat Gallbladder problems--severe stomach pain, nausea, vomiting, fever Heart rhythm changes--fast or irregular heartbeat, dizziness, feeling faint or lightheaded, chest pain, trouble breathing High blood sugar (hyperglycemia)--increased thirst or amount of urine, unusual weakness or fatigue, blurry vision Low blood sugar (hypoglycemia)--tremors or shaking, anxiety, sweating, cold or clammy skin, confusion, dizziness, rapid heartbeat Low thyroid levels (hypothyroidism)--unusual weakness or fatigue, increased sensitivity to cold, constipation, hair loss, dry skin, weight gain, feelings of depression Low vitamin B12 level--pain, tingling, or numbness in the hands or feet, muscle weakness, dizziness, confusion, trouble concentrating Pancreatitis--severe stomach pain that spreads to your back or gets worse after eating or when touched, fever, nausea, vomiting Side effects that usually do not require medical attention (report to your care team if they continue or are bothersome): Diarrhea Dizziness Gas Headache Pain, redness, or irritation at injection site Stomach pain This list may not describe all possible side effects. Call your doctor for medical advice about side effects. You may report side effects to FDA at 1-800-FDA-1088. Where should I keep my medication? Keep out of the reach of children and pets. Store in the refrigerator. Protect from light. Allow to come to room temperature naturally. Do not use artificial heat. If protected from light, the injection may be stored between 20 and 30 degrees C (70 and 86 degrees F) for 14 days. After the initial use, throw away any unused portion of a multiple dose vial after 14 days. Get rid of any unused portions of the ampules after use. To get rid of medications that are no longer needed or have expired: Take the medication to a medication  take-back program. Ask your pharmacy or law enforcement to find a location. If you cannot return the medication, ask your pharmacist or care team how to get rid of the medication safely. NOTE: This sheet is a summary. It may not cover all possible information. If you have questions about this medicine, talk to your doctor, pharmacist, or health care provider.  2023 Elsevier/Gold Standard (2021-07-18 00:00:00)  

## 2022-08-02 ENCOUNTER — Telehealth: Payer: Self-pay | Admitting: Oncology

## 2022-08-02 NOTE — Telephone Encounter (Signed)
08/02/22 LVM-CT SCAN ON 08/19/22 ARRIVE AT 1030AM

## 2022-08-16 NOTE — Progress Notes (Signed)
[facility]  [address] [place],  [place]  [address] [phone]   Clinic Day:  08/20/22  Referring physician: Mikki Santee Key, *   ASSESSMENT & PLAN:  Assessment & Plan: Malignant carcinoid tumor of unknown primary site [facility]) Metastatic carcinoid tumor consistent with gastrointestinal origin in September 2021.  He does have a terminal ileum mass measuring 4.0 x 2.8 x 3.7 cm, and a mass of the mesentery of the small intestine measuring 3.9 x 2.3 x 4.0 cm.  As this has already metastasized to the liver, surgical resection was not indicated.  24 hour urine for 5 HIAA was quite elevated at 86.8 consistent with carcinoid syndrome.  Chromogranin A was also elevated at 214. He started treatment with octreotide injections every 4 weeks in October 2021.  Due to the persistent diarrhea, we increased the octreotide to 30 mg every 4 weeks. CT imaging in August 2022 was stable.  The chromogranin A from November 2022 was down to 118.6, but has been mildly increasing this year.  CT scan in April 2023 remained stable.  The chromogranin A was 152 in July.  The patient was hospitalized in September. CT abdomen pelvis at that time revealed small-bowel obstruction, with transition point in the mid to distal jejunum adjacent to the known mesenteric carcinoid tumor. There is a stable, 3.9 cm, calcified central mesenteric mass, consistent with carcinoid tumor, and numerous liver masses, consistent with metastatic carcinoid tumor. The index lesions are not appreciably changed since prior exam. The chromogranin A was up to 169 in October of 2023, but went down to 145.4 in December, 2023 and 124.9 in March, 2024. CT scan from 08/19/22 remains stable.   Secondary neuroendocrine tumor of liver (HCC) Multiple liver masses, the largest measuring 4.0 x 4.3 cm, with metastatic carcinoid and carcinoid syndrome, which were stable on CT imaging in March of 2024.       Thrombocytopenia This is mild but new and will need to be monitored.    Plan:  He had a CT scan done on 08/20/2022 that revealed no significant change in the calcified retractile mass in the base of the mesentery, typical of carcinoid. There is no gross change in the previously demonstrated hepatic metastases, although evaluation is limited by the lack of intravenous contrast and interval improvement in the background hepatic steatosis. He has interval resolution of previously demonstrated small bowel distension and no evidence of progressive metastatic disease.  His last chromogranin A came down from 145 to 124.9 on 07/23/2022. His CBC is normal although his platelet count is at 122,000 today. His next Sandostatin injection is scheduled on 08/22/2022 and he will continue to receive it every 4 weeks. I will see him back in 8 weeks with CBC, CMP, and Chromogranin A.  The patient understands the plans discussed today and is in agreement with them.  He knows to contact our office if he develops concerns prior to his next appointment.     I provided 17 minutes of face-to-face time during this encounter and > 50% was spent counseling as documented under my assessment and plan.      Dellia Beckwith, MD  [facility] [address] [place] [address] Dept: [phone] Dept Fax: [phone]    No orders of the defined types were placed in this encounter.     CHIEF COMPLAINT:  CC: Metastatic carcinoid   Current Treatment: Octreotide 30 mg every  4 weeks   HISTORY OF PRESENT ILLNESS:  Anis Kendall is a 62 y.o. male with metastatic carcinoid to liver diagnosed in September 2021.  The patient presented in September with hematuria, felt to be due to a kidney stone.  CT abdomen and pelvis revealed multiple liver masses, the largest measuring 4.0 x 4.3 cm, a terminal ileum mass measuring 4.0 x 2.8 x 3.7 cm, and a mass of the  mesentery of the small intestine measuring 3.9 x 2.3 x 4.0 cm.  Diagnostic colonoscopy with Dr. Vinson Moselle in September revealed a large mucosal covered mass protruding through the ileocecal valve with peristalsis.  Biopsy was collected and surgical pathology was benign.  Liver biopsy revealed metastatic neuroendocrine tumor, grade 2, consistent with gastrointestinal primary. Synaptophysin and chromogranin-A immunostains were positive as well as CDX-2 supporting gastrointestinal origin.  Ki67 was 6%.  He reports flushing of his face with alcohol and certain foods.  He also reported diarrhea.  24 hour urine for 5HIAA was also elevated at 86.8, which is consistent with carcinoid.  Chromogranin A was elevated at 214 in October and came down to 116.9 in December.  CT chest from November 2021 revealed left paratracheal adenopathy and pleural nodularity along the right hemidiaphragm measuring up to 12 mm, worrisome for metastatic disease. As he had metastatic disease, resection of his primary was not recommended. The patient was started on octreotide 20 mg IM monthly in October.  Octreotide was subsequently increased to 30 mg monthly in December due to persistent diarrhea.   CT chest, abdomen and pelvis in February 2022 revealed mostly stable to slightly decreased appearance of the carcinoid tumor, including the mesenteric lesion.  The liver lesions were stable with no new lesions. There was stable nodularity/lobularity along the right hemidiaphragm.  He has had a continued decrease in the Chromogranin A, which was 111.4 in February, and then 100.3 in March, which is within normal limits.  He had improvement in his diarrhea with the increase in octreotide. The chromogranin A was up slightly in May to 126. CT chest, abdomen and pelvis in August revealed no new or progressive metastatic disease.  Findings are suggestive of multifocal small bowel carcinoid with conglomerate mesenteric nodal metastases.  Widespread  hyperenhancing liver metastases are stable.  Stable clustered subpleural solid pulmonary nodules along the right hemidiaphragm in the basilar right lower lobe are compatible with pulmonary metastases.  There is new background diffuse hepatic steatosis. Chromogranin A was down to 107.5 in August.  The chromogranin A was 118 in November.   CT abdomen and pelvis in April 2023 revealed stable hepatic metastatic disease and stable hepatic steatosis.  There was a stable 3.6 cm partially calcified mesenteric mass, consistent with carcinoid tumor.  No evidence of new or progressive metastatic disease.  Bilateral nephrolithiasis, a small left inguinal hernia and aortic atherosclerosis were also seen.  He was hospitalized in September at [facility] with a small bowel obstruction.  CT abdomen and pelvis at that time revealed small-bowel obstruction, with transition point in the mid to distal jejunum adjacent to the known mesenteric carcinoid tumor. There was a stable calcified central mesenteric mass consistent with carcinoid tumor, and numerous liver masses, consistent with metastatic carcinoid tumor. The index lesions are not appreciably changed since prior exam. Hepatic steatosis, a non-obstructing left renal calculus and aortic atherosclerosis were seen.  We have continued octreotide 30 mg every 4 weeks with good control of his diarrhea.   INTERVAL HISTORY:  Danell is here today for repeat clinical  assessment for metastatic carcinoid. Patient states that he feels well and has no complaints of pain. He had a CT scan done on 08/20/2022 that revealed no significant change in the calcified retractile mass in the base of the mesentery, typical of carcinoid. There is no gross change in the previously demonstrated hepatic metastases, although evaluation is limited by the lack of intravenous contrast and interval improvement in the background hepatic steatosis. He has Interval resolution of previously demonstrated small bowel  distension and no evidence of progressive metastatic disease.  His last chromogranin A came down from 145 to 124.9 on 07/23/2022. His CBC is fairly normal but his platelet count is down to 122,000 today. His next Sandostatin injection is scheduled on 08/22/2022 and he will receive it every 4 weeks. I will see him back in 8 weeks with CBC, CMP, and Chromogranin A. His face does get flushed occasionally and his bowels are normal. He denies signs of infection such as sore throat, sinus drainage, cough, or urinary symptoms.  He denies fevers or recurrent chills. He denies pain. He denies nausea, vomiting, chest pain, dyspnea or cough. His appetite is great and his weight has decreased 3 pounds over last month .   REVIEW OF SYSTEMS:  Review of Systems  Constitutional: Negative.  Negative for appetite change, chills, diaphoresis, fatigue, fever and unexpected weight change.  HENT:  Negative.  Negative for hearing loss, lump/mass, mouth sores, nosebleeds, sore throat, tinnitus, trouble swallowing and voice change.   Eyes: Negative.  Negative for eye problems and icterus.  Respiratory: Negative.  Negative for chest tightness, cough, hemoptysis, shortness of breath and wheezing.   Cardiovascular: Negative.  Negative for chest pain, leg swelling and palpitations.  Gastrointestinal: Negative.  Negative for abdominal distention, abdominal pain, blood in stool, constipation, diarrhea, nausea, rectal pain and vomiting.  Endocrine: Negative.  Negative for hot flashes.  Genitourinary: Negative.  Negative for bladder incontinence, difficulty urinating, dyspareunia, dysuria, frequency, hematuria, nocturia, pelvic pain and penile discharge.   Musculoskeletal: Negative.  Negative for arthralgias, back pain, flank pain, gait problem, myalgias, neck pain and neck stiffness.  Skin: Negative.  Negative for itching, rash and wound.  Neurological: Negative.  Negative for dizziness, extremity weakness, gait problem, headaches,  light-headedness, numbness, seizures and speech difficulty.  Hematological: Negative.  Negative for adenopathy. Does not bruise/bleed easily.  Psychiatric/Behavioral: Negative.  Negative for confusion, decreased concentration, depression, sleep disturbance and suicidal ideas. The patient is not nervous/anxious.     VITALS:  Blood pressure (!) 143/90, pulse 63, temperature 98.1 F (36.7 C), temperature source Oral, resp. rate 18, height 5\' 10"  (1.778 m), weight 242 lb 8 oz (110 kg), SpO2 100 %.     Wt Readings from Last 3 Encounters:  05/29/22 243 lb 1.9 oz (110.3 kg)  05/28/22 242 lb 8 oz (110 kg)  05/01/22 241 lb 1 oz (109.3 kg)    Body mass index is 34.8 kg/m.   Performance status (ECOG): 1 - Symptomatic but completely ambulatory   PHYSICAL EXAM:  Physical Exam Vitals and nursing note reviewed.  Constitutional:      General: He is not in acute distress.    Appearance: Normal appearance. He is normal weight. He is not ill-appearing, toxic-appearing or diaphoretic.  HENT:     Head: Normocephalic and atraumatic.     Right Ear: Tympanic membrane, ear canal and external ear normal. There is no impacted cerumen.     Left Ear: Tympanic membrane, ear canal and external ear normal. There is  no impacted cerumen.     Nose: Nose normal. No congestion or rhinorrhea.     Mouth/Throat:     Mouth: Mucous membranes are moist.     Pharynx: Oropharynx is clear. No oropharyngeal exudate or posterior oropharyngeal erythema.  Eyes:     General: No scleral icterus.       Right eye: No discharge.        Left eye: No discharge.     Extraocular Movements: Extraocular movements intact.     Conjunctiva/sclera: Conjunctivae normal.     Pupils: Pupils are equal, round, and reactive to light.  Neck:     Vascular: No carotid bruit.  Cardiovascular:     Rate and Rhythm: Normal rate and regular rhythm.     Pulses: Normal pulses.     Heart sounds: Normal heart sounds. No murmur heard.    No friction  rub. No gallop.  Pulmonary:     Effort: Pulmonary effort is normal. No respiratory distress.     Breath sounds: Normal breath sounds. No stridor. No wheezing, rhonchi or rales.  Chest:     Chest wall: No tenderness.  Abdominal:     General: Bowel sounds are normal. There is no distension.     Palpations: Abdomen is soft. There is no hepatomegaly, splenomegaly or mass.     Tenderness: There is no abdominal tenderness. There is no right CVA tenderness, left CVA tenderness, guarding or rebound.     Hernia: No hernia is present.     Comments: Liver is palpable at the right coastal margin.  Musculoskeletal:        General: No swelling, tenderness, deformity or signs of injury. Normal range of motion.     Cervical back: Normal range of motion and neck supple. No rigidity or tenderness.     Right lower leg: No edema.     Left lower leg: No edema.  Lymphadenopathy:     Cervical: No cervical adenopathy.     Upper Body:     Right upper body: No supraclavicular or axillary adenopathy.     Left upper body: No supraclavicular or axillary adenopathy.     Lower Body: No right inguinal adenopathy. No left inguinal adenopathy.  Skin:    General: Skin is warm and dry.     Coloration: Skin is not jaundiced or pale.     Findings: No bruising, erythema, lesion or rash.  Neurological:     General: No focal deficit present.     Mental Status: He is alert and oriented to person, place, and time. Mental status is at baseline.     Cranial Nerves: No cranial nerve deficit.     Sensory: No sensory deficit.     Motor: No weakness.     Coordination: Coordination normal.     Gait: Gait normal.     Deep Tendon Reflexes: Reflexes normal.  Psychiatric:        Mood and Affect: Mood normal.        Behavior: Behavior normal.        Thought Content: Thought content normal.        Judgment: Judgment normal.    LABS:    Component Ref Range & Units 07/23/22 1 mo ago 2 mo ago 3 mo ago  Hemoglobin 13.5 - 17.5  14.9 14.7 R 14.9 R 15.3 R  HCT 41 - 53 44 44.6 R 44.8 R 46.6 R  Neutrophils Absolute 5.77 4.9 R 4.8 R 4.7 R  Platelets 150 -  400 K/uL 144 Abnormal  165 141 Low  161  WBC 7.3 6.4 R 6.3 R 6.0 R   Component Ref Range & Units 07/23/22 1 mo ago 2 mo ago 3 mo ago  Glucose 174 110 High  R, CM 129 High  R, CM 88 R, CM  BUN 4 - 21 17 23  R 18 R 19 R  CO2 13 - 22 24 Abnormal  26 R 25 R 25 R  Creatinine 0.6 - 1.3 1.4 Abnormal  1.69 High  R 1.49 High  R 1.85 High  R  Potassium 3.5 - 5.1 mEq/L 4.2 4.3 R 4.0 R 4.1 R  Sodium 137 - 147 137 140 R 140 R 142 R  Chloride 99 - 108 105 107 R 106 R      Component Ref Range & Units 3 wk ago 1 mo ago 2 mo ago 3 mo ago  Alkaline Phosphatase 25 - 125 42 51 R 49 R 52 R  ALT 10 - 40 U/L 42 Abnormal  27 R 39 R 45 High  R  AST 14 - 40 32 25 R 28 R 33 R  Bilirubin, Total 0.9      Component Ref Range & Units 3 wk ago 1 mo ago 2 mo ago 3 mo ago  Calcium 8.7 - 10.7 8.3 Abnormal  8.7 Low  R 8.8 Low  R 8.9 R  Albumin 3.5 - 5.0 3.9 4.3 R 4.1 R 4.3 R   Component Ref Range & Units 3 wk ago (07/23/22) 4 mo ago (04/02/22) 5 mo ago (02/25/22) 8 mo ago (11/26/21)  Chromogranin A (ng/mL) 0.0 - 101.8 ng/mL 124.9 High  145.4 High  CM 168.9 High  CM 151.7 High  CM       Latest Ref Rng & Units 05/28/2022   11:24 AM 04/30/2022   11:03 AM 04/02/2022    2:33 PM  CBC  WBC 4.0 - 10.5 K/uL 6.3  6.0  7.1   Hemoglobin 13.0 - 17.0 g/dL 16.1  09.6  04.5   Hematocrit 39.0 - 52.0 % 44.8  46.6  43.2   Platelets 150 - 400 K/uL 141  161  162         Latest Ref Rng & Units 05/28/2022   11:24 AM 04/30/2022   11:03 AM 04/02/2022    2:33 PM  CMP  Glucose 70 - 99 mg/dL 409  88  97   BUN 8 - 23 mg/dL 18  19  18    Creatinine 0.61 - 1.24 mg/dL 8.11  9.14  7.82   Sodium 135 - 145 mmol/L 140  142  142   Potassium 3.5 - 5.1 mmol/L 4.0  4.1  4.1   Chloride 98 - 111 mmol/L 106  107  111   CO2 22 - 32 mmol/L 25  25  24    Calcium 8.9 - 10.3 mg/dL 8.8  8.9  8.8   Total Protein 6.5 -  8.1 g/dL 6.5  7.2  6.7   Total Bilirubin 0.3 - 1.2 mg/dL 1.0  0.8  1.0   Alkaline Phos 38 - 126 U/L 49  52  60   AST 15 - 41 U/L 28  33  31   ALT 0 - 44 U/L 39  45  43     Recent Labs  No results found for: "CEA1", "CEA"   /  Last Labs  No results found for: "CEA1", "CEA"   Recent Labs  No results found for: "  PSA1"   Recent Labs  No results found for: [phone]"   Recent Labs  No results found for: "CAN125"    Recent Labs  No results found for: "TOTALPROTELP", "ALBUMINELP", "A1GS", "A2GS", "BETS", "BETA2SER", "GAMS", "MSPIKE", "SPEI"   Recent Labs  No results found for: "TIBC", "FERRITIN", "IRONPCTSAT"   Recent Labs  No results found for: "LDH"     STUDIES:  Exam: 08/19/2022 CT Abdomen and Pelvis without Contrast Impression: No significant change in the calcified retractile mass in the base of the mesentery, typical of carcinoid. No gross change in the previously demonstrated hepatic metastases, although evaluation is limited by the lack of intravenous contrast and interval improvement in the background hepatic steatosis.  Interval resolution of previously demonstrated small bowel distension. No evidence of progressive metastatic disease.  Non-obstructing left renal calculi.  Aortic Atherosclerosis (ICD10-170.0)      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)    Malignant nonargentaffin carcinoid tumor (HCC)    Malignant nonargentaffin carcinoid tumor (HCC)    Secondary malignant carcinoid tumor of liver [facility])    Past Surgical History:  Procedure Laterality Date   CYSTOTOMY  01/2020   IR URETERAL STENT LEFT NEW ACCESS W/O SEP NEPHROSTOMY CATH  06/13/2020   IR URETERAL STENT PLACEMENT EXISTING ACCESS LEFT  06/15/2020   LYMPH NODE BIOPSY Left    supraclavicular    NEPHROLITHOTOMY Left 06/13/2020   Procedure: NEPHROLITHOTOMY PERCUTANEOUS;  Surgeon: Noel Christmas, MD;  Location: [facility];  Service: Urology;  Laterality: Left;  2 HRS   ORCHIECTOMY  1996   UMBILICAL HERNIA REPAIR     Family History  Problem Relation Age of Onset   Cancer Mother        jaw bone   Throat cancer Father        smoker   Social History   Tobacco Use  Smoking Status Former   Packs/day: 0.25   Years: 19.00   Additional pack years: 0.00   Total pack years: 4.75   Types: Cigarettes   Quit date: 26   Years since quitting: 27.3  Smokeless Tobacco Never   Social History   Substance and Sexual Activity  Alcohol Use Yes   Comment: few beers only on days off   Social History   Substance and Sexual Activity  Drug Use Never  No Known Allergies  Current Medications:       Current Outpatient Medications  Medication Sig Dispense Refill   tamsulosin (FLOMAX) 0.4 MG CAPS capsule SMARTSIG:1 Tablet(s) By Mouth Every Evening       octreotide (SANDOSTATIN LAR) 20 MG injection Inject 20 mg into the muscle every 28 (twenty-eight) days.        No current facility-administered medications for this visit.        I,Jasmine M Lassiter,acting as a scribe for Dellia Beckwith, MD.,have documented all relevant documentation on the behalf of Dellia Beckwith, MD,as directed by  Dellia Beckwith, MD while in the presence of Dellia Beckwith, MD.

## 2022-08-19 LAB — BASIC METABOLIC PANEL
BUN: 19 (ref 4–21)
CO2: 21 (ref 13–22)
Chloride: 109 — AB (ref 99–108)
Creatinine: 1.4 — AB (ref 0.6–1.3)
EGFR: 51
Glucose: 118
Potassium: 4.4 mEq/L (ref 3.5–5.1)
Sodium: 139 (ref 137–147)

## 2022-08-19 LAB — CBC AND DIFFERENTIAL
HCT: 44 (ref 41–53)
Hemoglobin: 15.1 (ref 13.5–17.5)
Neutrophils Absolute: 5.03
Platelets: 122 10*3/uL — AB (ref 150–400)
WBC: 6.7

## 2022-08-19 LAB — HEPATIC FUNCTION PANEL
ALT: 35 U/L (ref 10–40)
AST: 28 (ref 14–40)
Alkaline Phosphatase: 48 (ref 25–125)
Bilirubin, Total: 0.8

## 2022-08-19 LAB — COMPREHENSIVE METABOLIC PANEL WITH GFR
Albumin: 3.7 (ref 3.5–5.0)
Calcium: 8.5 — AB (ref 8.7–10.7)

## 2022-08-19 LAB — CBC: RBC: 5.11 (ref 3.87–5.11)

## 2022-08-20 ENCOUNTER — Other Ambulatory Visit: Payer: Self-pay | Admitting: Oncology

## 2022-08-20 ENCOUNTER — Encounter: Payer: Self-pay | Admitting: Oncology

## 2022-08-20 ENCOUNTER — Other Ambulatory Visit: Payer: 59

## 2022-08-20 ENCOUNTER — Inpatient Hospital Stay: Payer: 59 | Attending: Oncology | Admitting: Oncology

## 2022-08-20 VITALS — BP 136/89 | HR 64 | Temp 98.0°F | Resp 18 | Ht 70.0 in | Wt 240.7 lb

## 2022-08-20 DIAGNOSIS — C7B8 Other secondary neuroendocrine tumors: Secondary | ICD-10-CM | POA: Diagnosis not present

## 2022-08-20 DIAGNOSIS — C7A00 Malignant carcinoid tumor of unspecified site: Secondary | ICD-10-CM

## 2022-08-20 DIAGNOSIS — E340 Carcinoid syndrome: Secondary | ICD-10-CM | POA: Insufficient documentation

## 2022-08-20 DIAGNOSIS — C7B02 Secondary carcinoid tumors of liver: Secondary | ICD-10-CM | POA: Insufficient documentation

## 2022-08-22 ENCOUNTER — Inpatient Hospital Stay: Payer: 59

## 2022-08-22 VITALS — BP 148/88 | HR 57 | Temp 98.0°F | Resp 18 | Ht 70.0 in | Wt 240.8 lb

## 2022-08-22 DIAGNOSIS — C7A00 Malignant carcinoid tumor of unspecified site: Secondary | ICD-10-CM | POA: Diagnosis present

## 2022-08-22 DIAGNOSIS — C7B02 Secondary carcinoid tumors of liver: Secondary | ICD-10-CM | POA: Diagnosis present

## 2022-08-22 DIAGNOSIS — E340 Carcinoid syndrome: Secondary | ICD-10-CM | POA: Diagnosis present

## 2022-08-22 DIAGNOSIS — C7B8 Other secondary neuroendocrine tumors: Secondary | ICD-10-CM

## 2022-08-22 MED ORDER — OCTREOTIDE ACETATE 30 MG IM KIT
30.0000 mg | PACK | Freq: Once | INTRAMUSCULAR | Status: AC
  Administered 2022-08-22: 30 mg via INTRAMUSCULAR
  Filled 2022-08-22: qty 1

## 2022-08-22 NOTE — Patient Instructions (Signed)
Octreotide Injection Suspension What is this medication? OCTREOTIDE (ok TREE oh tide) treats high levels of growth hormone (acromegaly). It works by reducing the amount of growth hormone your body makes. This reduces symptoms and the risk of health problems caused by too much growth hormone, such as diabetes and heart disease. It may also be used to treat diarrhea caused by neuroendocrine tumors. It works by slowing down the release of serotonin from the tumor cells. This reduces the number of bowel movements you have. This medicine may be used for other purposes; ask your health care provider or pharmacist if you have questions. COMMON BRAND NAME(S): Sandostatin LAR What should I tell my care team before I take this medication? They need to know if you have any of these conditions: Diabetes Gallbladder disease Kidney disease Liver disease Thyroid disease An unusual or allergic reaction to octreotide, other medications, foods, dyes, or preservatives Pregnant or trying to get pregnant Breast-feeding How should I use this medication? This medication is injected into a muscle. It is usually given by your care team in a hospital or clinic setting. Talk to your care team about the use of this medication in children. Special care may be needed. Overdosage: If you think you have taken too much of this medicine contact a poison control center or emergency room at once. NOTE: This medicine is only for you. Do not share this medicine with others. What if I miss a dose? Keep appointments for follow-up doses. It is important not to miss your dose. Call your care team if you are unable to keep an appointment. What may interact with this medication? Do not take this medication with any of the following: Cisapride Dronedarone Flibanserin Lutetium Lu 177 dotatate Pimozide Saquinavir Thioridazine This medication may also interact with the following: Bromocriptine Certain medications for blood  pressure, heart disease, irregular heartbeat Cyclosporine Diuretics Medications for diabetes, including insulin Quinidine This list may not describe all possible interactions. Give your health care provider a list of all the medicines, herbs, non-prescription drugs, or dietary supplements you use. Also tell them if you smoke, drink alcohol, or use illegal drugs. Some items may interact with your medicine. What should I watch for while using this medication? Visit your care team for regular checks on your progress. Tell your care team if your symptoms do not start to get better or if they get worse. This medication may cause decreases in blood sugar. Signs of low blood sugar include chills, cool, pale skin or cold sweats, drowsiness, extreme hunger, fast heartbeat, headache, nausea, nervousness or anxiety, shakiness, trembling, unsteadiness, tiredness, or weakness. Contact your care team right away if you experience any of these symptoms. This medication may increase blood sugar. The risk may be higher in patients who already have diabetes. Ask your care team what you can do to lower your risk of diabetes while taking this medication. You should make sure you get enough vitamin B12 while you are taking this medication. Discuss the foods you eat and the vitamins you take with your care team. What side effects may I notice from receiving this medication? Side effects that you should report to your care team as soon as possible: Allergic reactions--skin rash, itching, hives, swelling of the face, lips, tongue, or throat Gallbladder problems--severe stomach pain, nausea, vomiting, fever Heart rhythm changes--fast or irregular heartbeat, dizziness, feeling faint or lightheaded, chest pain, trouble breathing High blood sugar (hyperglycemia)--increased thirst or amount of urine, unusual weakness or fatigue, blurry vision Low blood   sugar (hypoglycemia)--tremors or shaking, anxiety, sweating, cold or clammy  skin, confusion, dizziness, rapid heartbeat Low thyroid levels (hypothyroidism)--unusual weakness or fatigue, increased sensitivity to cold, constipation, hair loss, dry skin, weight gain, feelings of depression Low vitamin B12 level--pain, tingling, or numbness in the hands or feet, muscle weakness, dizziness, confusion, trouble concentrating Pancreatitis--severe stomach pain that spreads to your back or gets worse after eating or when touched, fever, nausea, vomiting Side effects that usually do not require medical attention (report to your care team if they continue or are bothersome): Diarrhea Dizziness Gas Headache Pain, redness, or irritation at injection site Stomach pain This list may not describe all possible side effects. Call your doctor for medical advice about side effects. You may report side effects to FDA at 1-800-FDA-1088. Where should I keep my medication? This medication is given in a hospital or clinic. It will not be stored at home. NOTE: This sheet is a summary. It may not cover all possible information. If you have questions about this medicine, talk to your doctor, pharmacist, or health care provider.  2023 Elsevier/Gold Standard (2021-04-06 00:00:00)  

## 2022-08-31 ENCOUNTER — Encounter: Payer: Self-pay | Admitting: Oncology

## 2022-09-17 ENCOUNTER — Ambulatory Visit: Payer: 59 | Admitting: Oncology

## 2022-09-18 ENCOUNTER — Inpatient Hospital Stay: Payer: 59 | Attending: Oncology

## 2022-09-18 DIAGNOSIS — C7B02 Secondary carcinoid tumors of liver: Secondary | ICD-10-CM | POA: Diagnosis present

## 2022-09-18 DIAGNOSIS — C7A00 Malignant carcinoid tumor of unspecified site: Secondary | ICD-10-CM | POA: Insufficient documentation

## 2022-09-18 DIAGNOSIS — E340 Carcinoid syndrome: Secondary | ICD-10-CM | POA: Diagnosis present

## 2022-09-18 LAB — CBC WITH DIFFERENTIAL (CANCER CENTER ONLY)
Abs Immature Granulocytes: 0.03 10*3/uL (ref 0.00–0.07)
Basophils Absolute: 0.1 10*3/uL (ref 0.0–0.1)
Basophils Relative: 1 %
Eosinophils Absolute: 0.2 10*3/uL (ref 0.0–0.5)
Eosinophils Relative: 2 %
HCT: 45.8 % (ref 39.0–52.0)
Hemoglobin: 15 g/dL (ref 13.0–17.0)
Immature Granulocytes: 0 %
Lymphocytes Relative: 13 %
Lymphs Abs: 1.1 10*3/uL (ref 0.7–4.0)
MCH: 29.2 pg (ref 26.0–34.0)
MCHC: 32.8 g/dL (ref 30.0–36.0)
MCV: 89.3 fL (ref 80.0–100.0)
Monocytes Absolute: 0.4 10*3/uL (ref 0.1–1.0)
Monocytes Relative: 5 %
Neutro Abs: 7 10*3/uL (ref 1.7–7.7)
Neutrophils Relative %: 79 %
Platelet Count: 153 10*3/uL (ref 150–400)
RBC: 5.13 MIL/uL (ref 4.22–5.81)
RDW: 13.9 % (ref 11.5–15.5)
WBC Count: 8.8 10*3/uL (ref 4.0–10.5)
nRBC: 0 % (ref 0.0–0.2)

## 2022-09-18 LAB — CMP (CANCER CENTER ONLY)
ALT: 28 U/L (ref 0–44)
AST: 24 U/L (ref 15–41)
Albumin: 4.2 g/dL (ref 3.5–5.0)
Alkaline Phosphatase: 45 U/L (ref 38–126)
Anion gap: 5 (ref 5–15)
BUN: 21 mg/dL (ref 8–23)
CO2: 23 mmol/L (ref 22–32)
Calcium: 8.6 mg/dL — ABNORMAL LOW (ref 8.9–10.3)
Chloride: 112 mmol/L — ABNORMAL HIGH (ref 98–111)
Creatinine: 1.71 mg/dL — ABNORMAL HIGH (ref 0.61–1.24)
GFR, Estimated: 44 mL/min — ABNORMAL LOW (ref >60)
Glucose, Bld: 66 mg/dL — ABNORMAL LOW (ref 70–99)
Potassium: 3.8 mmol/L (ref 3.5–5.1)
Sodium: 140 mmol/L (ref 135–145)
Total Bilirubin: 0.4 mg/dL (ref 0.3–1.2)
Total Protein: 6.8 g/dL (ref 6.5–8.1)

## 2022-09-19 ENCOUNTER — Ambulatory Visit: Payer: 59

## 2022-09-20 ENCOUNTER — Inpatient Hospital Stay: Payer: 59

## 2022-09-20 VITALS — BP 133/80 | HR 72 | Temp 97.9°F | Resp 16 | Ht 70.0 in | Wt 244.0 lb

## 2022-09-20 DIAGNOSIS — C7B8 Other secondary neuroendocrine tumors: Secondary | ICD-10-CM

## 2022-09-20 DIAGNOSIS — C7A00 Malignant carcinoid tumor of unspecified site: Secondary | ICD-10-CM

## 2022-09-20 DIAGNOSIS — E340 Carcinoid syndrome: Secondary | ICD-10-CM | POA: Diagnosis not present

## 2022-09-20 MED ORDER — OCTREOTIDE ACETATE 30 MG IM KIT
30.0000 mg | PACK | Freq: Once | INTRAMUSCULAR | Status: AC
  Administered 2022-09-20: 30 mg via INTRAMUSCULAR
  Filled 2022-09-20: qty 1

## 2022-10-15 ENCOUNTER — Inpatient Hospital Stay: Payer: 59 | Attending: Oncology | Admitting: Oncology

## 2022-10-15 ENCOUNTER — Other Ambulatory Visit: Payer: Self-pay | Admitting: Oncology

## 2022-10-15 ENCOUNTER — Inpatient Hospital Stay: Payer: 59

## 2022-10-15 ENCOUNTER — Encounter: Payer: Self-pay | Admitting: Oncology

## 2022-10-15 VITALS — BP 135/83 | HR 58 | Temp 97.7°F | Resp 18 | Ht 70.0 in | Wt 245.6 lb

## 2022-10-15 DIAGNOSIS — C7B02 Secondary carcinoid tumors of liver: Secondary | ICD-10-CM | POA: Insufficient documentation

## 2022-10-15 DIAGNOSIS — C7A00 Malignant carcinoid tumor of unspecified site: Secondary | ICD-10-CM | POA: Diagnosis present

## 2022-10-15 DIAGNOSIS — E340 Carcinoid syndrome: Secondary | ICD-10-CM | POA: Insufficient documentation

## 2022-10-15 DIAGNOSIS — C7B8 Other secondary neuroendocrine tumors: Secondary | ICD-10-CM

## 2022-10-15 LAB — CBC WITH DIFFERENTIAL (CANCER CENTER ONLY)
Abs Immature Granulocytes: 0.03 10*3/uL (ref 0.00–0.07)
Basophils Absolute: 0.1 10*3/uL (ref 0.0–0.1)
Basophils Relative: 1 %
Eosinophils Absolute: 0.1 10*3/uL (ref 0.0–0.5)
Eosinophils Relative: 2 %
HCT: 44 % (ref 39.0–52.0)
Hemoglobin: 14.4 g/dL (ref 13.0–17.0)
Immature Granulocytes: 1 %
Lymphocytes Relative: 19 %
Lymphs Abs: 1.1 10*3/uL (ref 0.7–4.0)
MCH: 29.3 pg (ref 26.0–34.0)
MCHC: 32.7 g/dL (ref 30.0–36.0)
MCV: 89.4 fL (ref 80.0–100.0)
Monocytes Absolute: 0.6 10*3/uL (ref 0.1–1.0)
Monocytes Relative: 10 %
Neutro Abs: 3.8 10*3/uL (ref 1.7–7.7)
Neutrophils Relative %: 67 %
Platelet Count: 130 10*3/uL — ABNORMAL LOW (ref 150–400)
RBC: 4.92 MIL/uL (ref 4.22–5.81)
RDW: 14.4 % (ref 11.5–15.5)
WBC Count: 5.7 10*3/uL (ref 4.0–10.5)
nRBC: 0 % (ref 0.0–0.2)

## 2022-10-15 LAB — CMP (CANCER CENTER ONLY)
ALT: 36 U/L (ref 0–44)
AST: 27 U/L (ref 15–41)
Albumin: 4.1 g/dL (ref 3.5–5.0)
Alkaline Phosphatase: 49 U/L (ref 38–126)
Anion gap: 6 (ref 5–15)
BUN: 18 mg/dL (ref 8–23)
CO2: 21 mmol/L — ABNORMAL LOW (ref 22–32)
Calcium: 8.2 mg/dL — ABNORMAL LOW (ref 8.9–10.3)
Chloride: 111 mmol/L (ref 98–111)
Creatinine: 1.66 mg/dL — ABNORMAL HIGH (ref 0.61–1.24)
GFR, Estimated: 46 mL/min — ABNORMAL LOW (ref >60)
Glucose, Bld: 127 mg/dL — ABNORMAL HIGH (ref 70–99)
Potassium: 3.9 mmol/L (ref 3.5–5.1)
Sodium: 138 mmol/L (ref 135–145)
Total Bilirubin: 1.1 mg/dL (ref 0.3–1.2)
Total Protein: 6.6 g/dL (ref 6.5–8.1)

## 2022-10-15 NOTE — Progress Notes (Signed)
[facility]  [address] [place],  [place]  [address] [phone]   Clinic Day:  10/15/22  Referring physician: Mikki Santee Key, *   ASSESSMENT & PLAN:  Assessment & Plan: Malignant carcinoid tumor of unknown primary site [facility]) Metastatic carcinoid tumor consistent with gastrointestinal origin in September 2021.  He does have a terminal ileum mass measuring 4.0 x 2.8 x 3.7 cm, and a mass of the mesentery of the small intestine measuring 3.9 x 2.3 x 4.0 cm.  As this has already metastasized to the liver, surgical resection was not indicated.  24 hour urine for 5 HIAA was quite elevated at 86.8 consistent with carcinoid syndrome.  Chromogranin A was also elevated at 214. He started treatment with octreotide injections every 4 weeks in October 2021.  Due to the persistent diarrhea, we increased the octreotide to 30 mg every 4 weeks. CT imaging in August 2022 was stable.  The chromogranin A from November 2022 was down to 118.6, but has been mildly increasing this year.  CT scan in April 2023 remained stable.  The chromogranin A was 152 in July.  The patient was hospitalized in September. CT abdomen pelvis at that time revealed small-bowel obstruction, with transition point in the mid to distal jejunum adjacent to the known mesenteric carcinoid tumor. There is a stable, 3.9 cm, calcified central mesenteric mass, consistent with carcinoid tumor, and numerous liver masses, consistent with metastatic carcinoid tumor. The index lesions are not appreciably changed since prior exam. The chromogranin A was up to 169 in October of 2023, but went down to 145.4 in December, 2023 and 124.9 in March, 2024. CT scan from 08/19/22 remains stable.   Secondary neuroendocrine tumor of liver (HCC) Multiple liver masses, the largest measuring 4.0 x 4.3 cm, with metastatic carcinoid and carcinoid syndrome, which were stable on CT imaging in April of 2024.       Thrombocytopenia This is mild but new and will need to be monitored. His platelets were up to 153,000 in May, 2023.   Plan:  He says he has been drinking enough fluids and he passed a stone a few weeks ago. He has a history of low blood sugar, his glucose on 09/18/2022 was 66. He complains of dizziness sometimes and I suggested it could be due to low blood sugar. I advised him not to miss meals and if he feels light headed or dizzy he could get a snack to eat. He feels some flushing and warmth intermittently. He has normal bowel movements. His last CT scan in April was stable. His labs today are pending. His last chromogranin A came down from 145 to 124.9 on 07/23/2022. It has been steadily decreasing over the last 2 years. He is on Sandostatin injection every 4 weeks. I will see him back in 8 weeks with CBC, CMP and Chromogranin A. The patient understands the plans discussed today and is in agreement with them.  He knows to contact our office if he develops concerns prior to his next appointment.    I provided 14 minutes of face-to-face time during this encounter and > 50% was spent counseling as documented under my assessment and plan.      Dellia Beckwith, MD  [facility] [address] [place] [address] Dept: [phone] Dept Fax: [phone]    No orders of the defined types were placed in this encounter.  CHIEF COMPLAINT:  CC: Metastatic carcinoid   Current Treatment: Octreotide 30 mg every 4 weeks   HISTORY OF PRESENT ILLNESS:  Glenn Bender is a 62 y.o. male with metastatic carcinoid to liver diagnosed in September 2021.  The patient presented in September with hematuria, felt to be due to a kidney stone.  CT abdomen and pelvis revealed multiple liver masses, the largest measuring 4.0 x 4.3 cm, a terminal ileum mass measuring 4.0 x 2.8 x 3.7 cm, and a mass of the mesentery of the small intestine measuring  3.9 x 2.3 x 4.0 cm.  Diagnostic colonoscopy with Dr. Vinson Moselle in September revealed a large mucosal covered mass protruding through the ileocecal valve with peristalsis.  Biopsy was collected and surgical pathology was benign.  Liver biopsy revealed metastatic neuroendocrine tumor, grade 2, consistent with gastrointestinal primary. Synaptophysin and chromogranin-A immunostains were positive as well as CDX-2 supporting gastrointestinal origin.  Ki67 was 6%.  He reports flushing of his face with alcohol and certain foods.  He also reported diarrhea.  24 hour urine for 5HIAA was also elevated at 86.8, which is consistent with carcinoid.  Chromogranin A was elevated at 214 in October and came down to 116.9 in December.  CT chest from November 2021 revealed left paratracheal adenopathy and pleural nodularity along the right hemidiaphragm measuring up to 12 mm, worrisome for metastatic disease. As he had metastatic disease, resection of his primary was not recommended. The patient was started on octreotide 20 mg IM monthly in October.  Octreotide was subsequently increased to 30 mg monthly in December due to persistent diarrhea.   CT chest, abdomen and pelvis in February 2022 revealed mostly stable to slightly decreased appearance of the carcinoid tumor, including the mesenteric lesion.  The liver lesions were stable with no new lesions. There was stable nodularity/lobularity along the right hemidiaphragm.  He has had a continued decrease in the Chromogranin A, which was 111.4 in February, and then 100.3 in March, which is within normal limits.  He had improvement in his diarrhea with the increase in octreotide. The chromogranin A was up slightly in May to 126. CT chest, abdomen and pelvis in August revealed no new or progressive metastatic disease.  Findings are suggestive of multifocal small bowel carcinoid with conglomerate mesenteric nodal metastases.  Widespread hyperenhancing liver metastases are stable.  Stable  clustered subpleural solid pulmonary nodules along the right hemidiaphragm in the basilar right lower lobe are compatible with pulmonary metastases.  There is new background diffuse hepatic steatosis. Chromogranin A was down to 107.5 in August.  The chromogranin A was 118 in November.   CT abdomen and pelvis in April 2023 revealed stable hepatic metastatic disease and stable hepatic steatosis.  There was a stable 3.6 cm partially calcified mesenteric mass, consistent with carcinoid tumor.  No evidence of new or progressive metastatic disease.  Bilateral nephrolithiasis, a small left inguinal hernia and aortic atherosclerosis were also seen.  He was hospitalized in September at [facility] with a small bowel obstruction.  CT abdomen and pelvis at that time revealed small-bowel obstruction, with transition point in the mid to distal jejunum adjacent to the known mesenteric carcinoid tumor. There was a stable calcified central mesenteric mass consistent with carcinoid tumor, and numerous liver masses, consistent with metastatic carcinoid tumor. The index lesions are not appreciably changed since prior exam. Hepatic steatosis, a non-obstructing left renal calculus and aortic atherosclerosis were seen.  We have continued octreotide 30 mg every 4 weeks with good control of  his diarrhea.   INTERVAL HISTORY:  Krishan is here today for repeat clinical assessment for metastatic carcinoid to the liver. Patient states that he feels good and has no complaint of pain. He says he has been drinking enough fluids and he passed a stone a few weeks ago. He has a history of low blood sugar, his glucose on 09/18/2022 was 66. He complains of dizziness sometimes and I suggested it could be due to low blood sugar. I advised him not to miss meals and if he feels light headed or dizzy he could get a snack to eat. He feels some flushing and warmth intermittently, he has normal bowel movement. His last CT scan in April was stable. His labs today  are pending. His last chromogranin A came down from 145 to 124.9 on 07/23/2022. It has been steadily decreasing over the last 2 years. He is on Sandostatin injection every 4 weeks. I will see him back in 8 weeks with CBC, CMP and Chromogranin A. He  denies signs of infections such as sore throat, sinus drainage, cough or urinary symptoms. He  denies fever or recurrent chills. He  also deny nausea, vomiting, chest pain dyspnea or cough. His  appetite is great and His  weight has increased 1 pounds over last 4 weeks   REVIEW OF SYSTEMS:  Review of Systems  Constitutional: Negative.  Negative for appetite change, chills, diaphoresis, fatigue, fever and unexpected weight change.  HENT:  Negative.  Negative for hearing loss, lump/mass, mouth sores, nosebleeds, sore throat, tinnitus, trouble swallowing and voice change.   Eyes: Negative.  Negative for eye problems and icterus.  Respiratory: Negative.  Negative for chest tightness, cough, hemoptysis, shortness of breath and wheezing.   Cardiovascular: Negative.  Negative for chest pain, leg swelling and palpitations.  Gastrointestinal: Negative.  Negative for abdominal distention, abdominal pain, blood in stool, constipation, diarrhea, nausea, rectal pain and vomiting.  Endocrine: Negative.  Negative for hot flashes.  Genitourinary: Negative.  Negative for bladder incontinence, difficulty urinating, dyspareunia, dysuria, frequency, hematuria, nocturia, pelvic pain and penile discharge.   Musculoskeletal: Negative.  Negative for arthralgias, back pain, flank pain, gait problem, myalgias, neck pain and neck stiffness.  Skin: Negative.  Negative for itching, rash and wound.  Neurological: Negative.  Negative for dizziness, extremity weakness, gait problem, headaches, light-headedness, numbness, seizures and speech difficulty.  Hematological: Negative.  Negative for adenopathy. Does not bruise/bleed easily.  Psychiatric/Behavioral: Negative.  Negative for  confusion, decreased concentration, depression, sleep disturbance and suicidal ideas. The patient is not nervous/anxious.     VITALS:  Blood pressure (!) 143/90, pulse 63, temperature 98.1 F (36.7 C), temperature source Oral, resp. rate 18, height 5\' 10"  (1.778 m), weight 242 lb 8 oz (110 kg), SpO2 100 %.     Wt Readings from Last 3 Encounters:  05/29/22 243 lb 1.9 oz (110.3 kg)  05/28/22 242 lb 8 oz (110 kg)  05/01/22 241 lb 1 oz (109.3 kg)    Body mass index is 34.8 kg/m.   Performance status (ECOG): 1 - Symptomatic but completely ambulatory   PHYSICAL EXAM:  Physical Exam Vitals and nursing note reviewed.  Constitutional:      General: He is not in acute distress.    Appearance: Normal appearance. He is normal weight. He is not ill-appearing, toxic-appearing or diaphoretic.  HENT:     Head: Normocephalic and atraumatic.     Right Ear: Tympanic membrane, ear canal and external ear normal. There is no impacted cerumen.  Left Ear: Tympanic membrane, ear canal and external ear normal. There is no impacted cerumen.     Nose: Nose normal. No congestion or rhinorrhea.     Mouth/Throat:     Mouth: Mucous membranes are moist.     Pharynx: Oropharynx is clear. No oropharyngeal exudate or posterior oropharyngeal erythema.  Eyes:     General: No scleral icterus.       Right eye: No discharge.        Left eye: No discharge.     Extraocular Movements: Extraocular movements intact.     Conjunctiva/sclera: Conjunctivae normal.     Pupils: Pupils are equal, round, and reactive to light.  Neck:     Vascular: No carotid bruit.  Cardiovascular:     Rate and Rhythm: Normal rate and regular rhythm.     Pulses: Normal pulses.     Heart sounds: Normal heart sounds. No murmur heard.    No friction rub. No gallop.  Pulmonary:     Effort: Pulmonary effort is normal. No respiratory distress.     Breath sounds: Normal breath sounds. No stridor. No wheezing, rhonchi or rales.  Chest:      Chest wall: No tenderness.  Abdominal:     General: Bowel sounds are normal. There is no distension.     Palpations: Abdomen is soft. There is no hepatomegaly, splenomegaly or mass.     Tenderness: There is no abdominal tenderness. There is no right CVA tenderness, left CVA tenderness, guarding or rebound.     Hernia: No hernia is present.  Musculoskeletal:        General: No swelling, tenderness, deformity or signs of injury. Normal range of motion.     Cervical back: Normal range of motion and neck supple. No rigidity or tenderness.     Right lower leg: Edema (trace) present.     Left lower leg: Edema (trace) present.  Lymphadenopathy:     Cervical: No cervical adenopathy.     Upper Body:     Right upper body: No supraclavicular or axillary adenopathy.     Left upper body: No supraclavicular or axillary adenopathy.     Lower Body: No right inguinal adenopathy. No left inguinal adenopathy.  Skin:    General: Skin is warm and dry.     Coloration: Skin is not jaundiced or pale.     Findings: No bruising, erythema, lesion or rash.  Neurological:     General: No focal deficit present.     Mental Status: He is alert and oriented to person, place, and time. Mental status is at baseline.     Cranial Nerves: No cranial nerve deficit.     Sensory: No sensory deficit.     Motor: No weakness.     Coordination: Coordination normal.     Gait: Gait normal.     Deep Tendon Reflexes: Reflexes normal.  Psychiatric:        Mood and Affect: Mood normal.        Behavior: Behavior normal.        Thought Content: Thought content normal.        Judgment: Judgment normal.    LABS:    Component Ref Range & Units 3 wk ago (09/18/22) 1 mo ago (08/19/22) 1 mo ago (08/19/22) 2 mo ago (07/23/22) 2 mo ago (07/23/22) 3 mo ago (06/25/22) 4 mo ago (05/28/22)  WBC Count 4.0 - 10.5 K/uL 8.8  6.7 R  7.3 R 6.4 6.3  RBC 4.22 -  5.81 MIL/uL 5.13 5.11 R  5.09 R  5.08 5.07  Hemoglobin 13.0 - 17.0 g/dL 16.1  09.6  R  04.5 R 40.9 14.9  HCT 39.0 - 52.0 % 45.8  44 R  44 R 44.6 44.8  MCV 80.0 - 100.0 fL 89.3     87.8 88.4  MCH 26.0 - 34.0 pg 29.2     28.9 29.4  MCHC 30.0 - 36.0 g/dL 81.1     91.4 78.2  RDW 11.5 - 15.5 % 13.9     13.4 14.2  Platelet Count 150 - 400 K/uL 153  122 Abnormal   144 Abnormal  165 141 Low   nRBC 0.0 - 0.2 % 0.0     0.0 0.0  Neutrophils Relative % % 79     76 76  Neutro Abs 1.7 - 7.7 K/uL 7.0  5.03 R  5.77 R 4.9 4.8  Lymphocytes Relative % 13     14 14   Lymphs Abs 0.7 - 4.0 K/uL 1.1     0.9 0.9  Monocytes Relative % 5     6 6   Monocytes Absolute 0.1 - 1.0 K/uL 0.4     0.4 0.4  Eosinophils Relative % 2     2 2   Eosinophils Absolute 0.0 - 0.5 K/uL 0.2     0.1 0.1  Basophils Relative % 1     1 1   Basophils Absolute 0.0 - 0.1 K/uL 0.1     0.1 0.1  Immature Granulocytes % 0     1 1  Abs Immature Granulocytes 0.00 - 0.07 K/uL 0.03     0.04 CM 0.03 CM             Component Ref Range & Units 3 wk ago (09/18/22) 1 mo ago (08/19/22) 1 mo ago (08/19/22) 1 mo ago (08/19/22) 2 mo ago (07/23/22) 2 mo ago (07/23/22) 2 mo ago (07/23/22)  Sodium 135 - 145 mmol/L 140   139 R   137 R  Potassium 3.5 - 5.1 mmol/L 3.8   4.4 R   4.2 R  Chloride 98 - 111 mmol/L 112 High    109 Abnormal  R   105 R  CO2 22 - 32 mmol/L 23   21 R   24 Abnormal  R  Glucose, Bld 70 - 99 mg/dL 66 Low    956 R   213 R  BUN 8 - 23 mg/dL 21   19 R   17 R  Creatinine 0.61 - 1.24 mg/dL 0.86 High    1.4 Abnormal  R   1.4 Abnormal  R  Calcium 8.9 - 10.3 mg/dL 8.6 Low   8.5 Abnormal  R   8.3 Abnormal  R   Total Protein 6.5 - 8.1 g/dL 6.8        Albumin 3.5 - 5.0 g/dL 4.2  3.7 R   3.9 R   AST 15 - 41 U/L 24 28 R   32 R    ALT 0 - 44 U/L 28 35 R   42 Abnormal  R    Alkaline Phosphatase 38 - 126 U/L 45 48 R   42 R    Total Bilirubin 0.3 - 1.2 mg/dL 0.4        GFR, Estimated >60 mL/min 44 Low             Component Ref Range & Units 3 wk ago (07/23/22) 4 mo ago (04/02/22) 5 mo  ago (02/25/22) 8 mo ago (  11/26/21)  Chromogranin A (ng/mL) 0.0 - 101.8 ng/mL 124.9 High  145.4 High  CM 168.9 High  CM 151.7 High  CM       Latest Ref Rng & Units 05/28/2022   11:24 AM 04/30/2022   11:03 AM 04/02/2022    2:33 PM  CBC  WBC 4.0 - 10.5 K/uL 6.3  6.0  7.1   Hemoglobin 13.0 - 17.0 g/dL 16.1  09.6  04.5   Hematocrit 39.0 - 52.0 % 44.8  46.6  43.2   Platelets 150 - 400 K/uL 141  161  162         Latest Ref Rng & Units 05/28/2022   11:24 AM 04/30/2022   11:03 AM 04/02/2022    2:33 PM  CMP  Glucose 70 - 99 mg/dL 409  88  97   BUN 8 - 23 mg/dL 18  19  18    Creatinine 0.61 - 1.24 mg/dL 8.11  9.14  7.82   Sodium 135 - 145 mmol/L 140  142  142   Potassium 3.5 - 5.1 mmol/L 4.0  4.1  4.1   Chloride 98 - 111 mmol/L 106  107  111   CO2 22 - 32 mmol/L 25  25  24    Calcium 8.9 - 10.3 mg/dL 8.8  8.9  8.8   Total Protein 6.5 - 8.1 g/dL 6.5  7.2  6.7   Total Bilirubin 0.3 - 1.2 mg/dL 1.0  0.8  1.0   Alkaline Phos 38 - 126 U/L 49  52  60   AST 15 - 41 U/L 28  33  31   ALT 0 - 44 U/L 39  45  43     Recent Labs  No results found for: "CEA1", "CEA"   /  Last Labs  No results found for: "CEA1", "CEA"   Recent Labs  No results found for: "PSA1"   Recent Labs  No results found for: "NFA213"   Recent Labs  No results found for: "CAN125"    Recent Labs  No results found for: "TOTALPROTELP", "ALBUMINELP", "A1GS", "A2GS", "BETS", "BETA2SER", "GAMS", "MSPIKE", "SPEI"   Recent Labs  No results found for: "TIBC", "FERRITIN", "IRONPCTSAT"   Recent Labs  No results found for: "LDH"     STUDIES:  Exam: 08/19/2022 CT Abdomen and Pelvis without Contrast Impression: No significant change in the calcified retractile mass in the base of the mesentery, typical of carcinoid. No gross change in the previously demonstrated hepatic metastases, although evaluation is limited by the lack of intravenous contrast and interval improvement in the background hepatic steatosis.  Interval  resolution of previously demonstrated small bowel distension. No evidence of progressive metastatic disease.  Non-obstructing left renal calculi.  Aortic Atherosclerosis (ICD10-170.0)      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)    Malignant nonargentaffin carcinoid tumor (HCC)    Malignant nonargentaffin carcinoid tumor (HCC)    Secondary malignant carcinoid tumor of liver [facility])    Past Surgical History:  Procedure Laterality Date   CYSTOTOMY  01/2020   IR URETERAL STENT LEFT NEW ACCESS W/O SEP NEPHROSTOMY CATH  06/13/2020   IR URETERAL STENT PLACEMENT EXISTING ACCESS LEFT  06/15/2020   LYMPH NODE BIOPSY Left  supraclavicular   NEPHROLITHOTOMY Left 06/13/2020   Procedure: NEPHROLITHOTOMY PERCUTANEOUS;  Surgeon: Noel Christmas, MD;  Location: [facility];  Service: Urology;  Laterality: Left;  2 HRS   ORCHIECTOMY  1996   UMBILICAL HERNIA REPAIR     Family History  Problem Relation Age of Onset   Cancer Mother        jaw bone   Throat cancer Father        smoker   Social History   Tobacco Use  Smoking Status Former   Packs/day: 0.25   Years: 19.00   Additional pack years: 0.00   Total pack years: 4.75   Types: Cigarettes   Quit date: 31   Years since quitting: 27.5  Smokeless Tobacco Never   Social History   Substance and Sexual Activity  Alcohol Use Yes   Comment: few beers only on days off   Social History   Substance and Sexual Activity  Drug Use Never  No Known Allergies  Current Medications:       Current Outpatient Medications  Medication Sig Dispense Refill   tamsulosin (FLOMAX) 0.4 MG CAPS capsule SMARTSIG:1 Tablet(s) By Mouth Every Evening       octreotide (SANDOSTATIN LAR) 20 MG injection Inject 20 mg into the muscle every 28 (twenty-eight) days.         No current facility-administered medications for this visit.         I,Oluwatobi Asade,acting as a scribe for Dellia Beckwith, MD.,have documented all relevant documentation on the behalf of Dellia Beckwith, MD,as directed by  Dellia Beckwith, MD while in the presence of Dellia Beckwith, MD.

## 2022-10-17 LAB — CHROMOGRANIN A: Chromogranin A (ng/mL): 142.3 ng/mL — ABNORMAL HIGH (ref 0.0–101.8)

## 2022-10-18 ENCOUNTER — Inpatient Hospital Stay: Payer: 59

## 2022-10-18 VITALS — BP 141/82 | HR 63 | Temp 98.6°F | Resp 16 | Ht 70.0 in | Wt 245.0 lb

## 2022-10-18 DIAGNOSIS — C7B8 Other secondary neuroendocrine tumors: Secondary | ICD-10-CM

## 2022-10-18 DIAGNOSIS — C7A00 Malignant carcinoid tumor of unspecified site: Secondary | ICD-10-CM

## 2022-10-18 DIAGNOSIS — E340 Carcinoid syndrome: Secondary | ICD-10-CM | POA: Diagnosis not present

## 2022-10-18 MED ORDER — OCTREOTIDE ACETATE 30 MG IM KIT
30.0000 mg | PACK | Freq: Once | INTRAMUSCULAR | Status: AC
  Administered 2022-10-18: 30 mg via INTRAMUSCULAR
  Filled 2022-10-18: qty 1

## 2022-10-23 ENCOUNTER — Encounter: Payer: Self-pay | Admitting: Oncology

## 2022-10-25 ENCOUNTER — Telehealth: Payer: Self-pay

## 2022-10-25 NOTE — Telephone Encounter (Signed)
Attempted to contact patient. No answer. 

## 2022-10-25 NOTE — Telephone Encounter (Signed)
-----   Message from Dellia Beckwith, MD sent at 10/17/2022  6:06 PM EDT ----- Regarding: call Tell him labs are stable but calcium is low. If he is not taking rec 1 every day(600 mg)  If he is taking, needs to increase by 1/day

## 2022-11-06 ENCOUNTER — Telehealth: Payer: Self-pay

## 2022-11-06 NOTE — Telephone Encounter (Signed)
-----   Message from Christine H McCarty, MD sent at 10/17/2022  6:06 PM EDT ----- Regarding: call Tell him labs are stable but calcium is low. If he is not taking rec 1 every day(600 mg)  If he is taking, needs to increase by 1/day  

## 2022-11-06 NOTE — Telephone Encounter (Signed)
Attempted to contact patient. No answer. 

## 2022-11-15 ENCOUNTER — Inpatient Hospital Stay: Payer: 59 | Attending: Oncology

## 2022-11-15 VITALS — BP 143/80 | HR 51 | Temp 97.9°F | Resp 18 | Ht 70.0 in | Wt 248.2 lb

## 2022-11-15 DIAGNOSIS — C7B02 Secondary carcinoid tumors of liver: Secondary | ICD-10-CM | POA: Insufficient documentation

## 2022-11-15 DIAGNOSIS — E340 Carcinoid syndrome: Secondary | ICD-10-CM | POA: Diagnosis present

## 2022-11-15 DIAGNOSIS — C7A00 Malignant carcinoid tumor of unspecified site: Secondary | ICD-10-CM | POA: Diagnosis present

## 2022-11-15 DIAGNOSIS — C7B8 Other secondary neuroendocrine tumors: Secondary | ICD-10-CM

## 2022-11-15 MED ORDER — OCTREOTIDE ACETATE 30 MG IM KIT
30.0000 mg | PACK | Freq: Once | INTRAMUSCULAR | Status: AC
  Administered 2022-11-15: 30 mg via INTRAMUSCULAR

## 2022-11-15 NOTE — Patient Instructions (Signed)
Octreotide Injection Suspension What is this medication? OCTREOTIDE (ok TREE oh tide) treats high levels of growth hormone (acromegaly). It works by reducing the amount of growth hormone your body makes. This reduces symptoms and the risk of health problems caused by too much growth hormone, such as diabetes and heart disease. It may also be used to treat diarrhea caused by neuroendocrine tumors. It works by slowing down the release of serotonin from the tumor cells. This reduces the number of bowel movements you have. This medicine may be used for other purposes; ask your health care provider or pharmacist if you have questions. COMMON BRAND NAME(S): Sandostatin LAR What should I tell my care team before I take this medication? They need to know if you have any of these conditions: Diabetes Gallbladder disease Kidney disease Liver disease Thyroid disease An unusual or allergic reaction to octreotide, other medications, foods, dyes, or preservatives Pregnant or trying to get pregnant Breast-feeding How should I use this medication? This medication is injected into a muscle. It is usually given by your care team in a hospital or clinic setting. Talk to your care team about the use of this medication in children. Special care may be needed. Overdosage: If you think you have taken too much of this medicine contact a poison control center or emergency room at once. NOTE: This medicine is only for you. Do not share this medicine with others. What if I miss a dose? Keep appointments for follow-up doses. It is important not to miss your dose. Call your care team if you are unable to keep an appointment. What may interact with this medication? Do not take this medication with any of the following: Cisapride Dronedarone Flibanserin Lutetium Lu 177 dotatate Pimozide Saquinavir Thioridazine This medication may also interact with the following: Bromocriptine Certain medications for blood  pressure, heart disease, irregular heartbeat Cyclosporine Diuretics Medications for diabetes, including insulin Quinidine This list may not describe all possible interactions. Give your health care provider a list of all the medicines, herbs, non-prescription drugs, or dietary supplements you use. Also tell them if you smoke, drink alcohol, or use illegal drugs. Some items may interact with your medicine. What should I watch for while using this medication? Visit your care team for regular checks on your progress. Tell your care team if your symptoms do not start to get better or if they get worse. This medication may cause decreases in blood sugar. Signs of low blood sugar include chills, cool, pale skin or cold sweats, drowsiness, extreme hunger, fast heartbeat, headache, nausea, nervousness or anxiety, shakiness, trembling, unsteadiness, tiredness, or weakness. Contact your care team right away if you experience any of these symptoms. This medication may increase blood sugar. The risk may be higher in patients who already have diabetes. Ask your care team what you can do to lower your risk of diabetes while taking this medication. You should make sure you get enough vitamin B12 while you are taking this medication. Discuss the foods you eat and the vitamins you take with your care team. What side effects may I notice from receiving this medication? Side effects that you should report to your care team as soon as possible: Allergic reactions--skin rash, itching, hives, swelling of the face, lips, tongue, or throat Gallbladder problems--severe stomach pain, nausea, vomiting, fever Heart rhythm changes--fast or irregular heartbeat, dizziness, feeling faint or lightheaded, chest pain, trouble breathing High blood sugar (hyperglycemia)--increased thirst or amount of urine, unusual weakness or fatigue, blurry vision Low blood   sugar (hypoglycemia)--tremors or shaking, anxiety, sweating, cold or clammy  skin, confusion, dizziness, rapid heartbeat Low thyroid levels (hypothyroidism)--unusual weakness or fatigue, increased sensitivity to cold, constipation, hair loss, dry skin, weight gain, feelings of depression Low vitamin B12 level--pain, tingling, or numbness in the hands or feet, muscle weakness, dizziness, confusion, trouble concentrating Pancreatitis--severe stomach pain that spreads to your back or gets worse after eating or when touched, fever, nausea, vomiting Side effects that usually do not require medical attention (report to your care team if they continue or are bothersome): Diarrhea Dizziness Gas Headache Pain, redness, or irritation at injection site Stomach pain This list may not describe all possible side effects. Call your doctor for medical advice about side effects. You may report side effects to FDA at 1-800-FDA-1088. Where should I keep my medication? This medication is given in a hospital or clinic. It will not be stored at home. NOTE: This sheet is a summary. It may not cover all possible information. If you have questions about this medicine, talk to your doctor, pharmacist, or health care provider.  2024 Elsevier/Gold Standard (2021-07-19 00:00:00)  

## 2022-12-10 ENCOUNTER — Encounter: Payer: Self-pay | Admitting: Hematology and Oncology

## 2022-12-10 ENCOUNTER — Inpatient Hospital Stay: Payer: 59 | Attending: Hematology and Oncology

## 2022-12-10 ENCOUNTER — Inpatient Hospital Stay (INDEPENDENT_AMBULATORY_CARE_PROVIDER_SITE_OTHER): Payer: 59 | Admitting: Hematology and Oncology

## 2022-12-10 VITALS — BP 138/87 | HR 86 | Temp 97.9°F | Resp 18 | Ht 70.0 in | Wt 249.7 lb

## 2022-12-10 DIAGNOSIS — C7A00 Malignant carcinoid tumor of unspecified site: Secondary | ICD-10-CM

## 2022-12-10 DIAGNOSIS — E340 Carcinoid syndrome: Secondary | ICD-10-CM | POA: Insufficient documentation

## 2022-12-10 DIAGNOSIS — C7B02 Secondary carcinoid tumors of liver: Secondary | ICD-10-CM | POA: Insufficient documentation

## 2022-12-10 LAB — CMP (CANCER CENTER ONLY)
ALT: 34 U/L (ref 0–44)
AST: 29 U/L (ref 15–41)
Albumin: 3.9 g/dL (ref 3.5–5.0)
Alkaline Phosphatase: 42 U/L (ref 38–126)
Anion gap: 9 (ref 5–15)
BUN: 21 mg/dL (ref 8–23)
CO2: 21 mmol/L — ABNORMAL LOW (ref 22–32)
Calcium: 8.4 mg/dL — ABNORMAL LOW (ref 8.9–10.3)
Chloride: 108 mmol/L (ref 98–111)
Creatinine: 1.59 mg/dL — ABNORMAL HIGH (ref 0.61–1.24)
GFR, Estimated: 48 mL/min — ABNORMAL LOW (ref >60)
Glucose, Bld: 148 mg/dL — ABNORMAL HIGH (ref 70–99)
Potassium: 4.2 mmol/L (ref 3.5–5.1)
Sodium: 138 mmol/L (ref 135–145)
Total Bilirubin: 0.7 mg/dL (ref 0.3–1.2)
Total Protein: 6.1 g/dL — ABNORMAL LOW (ref 6.5–8.1)

## 2022-12-10 LAB — CBC WITH DIFFERENTIAL (CANCER CENTER ONLY)
Abs Immature Granulocytes: 0.03 10*3/uL (ref 0.00–0.07)
Basophils Absolute: 0 10*3/uL (ref 0.0–0.1)
Basophils Relative: 1 %
Eosinophils Absolute: 0.2 10*3/uL (ref 0.0–0.5)
Eosinophils Relative: 3 %
HCT: 42.5 % (ref 39.0–52.0)
Hemoglobin: 13.9 g/dL (ref 13.0–17.0)
Immature Granulocytes: 1 %
Lymphocytes Relative: 18 %
Lymphs Abs: 1 10*3/uL (ref 0.7–4.0)
MCH: 29.8 pg (ref 26.0–34.0)
MCHC: 32.7 g/dL (ref 30.0–36.0)
MCV: 91.2 fL (ref 80.0–100.0)
Monocytes Absolute: 0.4 10*3/uL (ref 0.1–1.0)
Monocytes Relative: 7 %
Neutro Abs: 4.1 10*3/uL (ref 1.7–7.7)
Neutrophils Relative %: 70 %
Platelet Count: 133 10*3/uL — ABNORMAL LOW (ref 150–400)
RBC: 4.66 MIL/uL (ref 4.22–5.81)
RDW: 14.3 % (ref 11.5–15.5)
WBC Count: 5.8 10*3/uL (ref 4.0–10.5)
nRBC: 0 % (ref 0.0–0.2)

## 2022-12-10 NOTE — Assessment & Plan Note (Addendum)
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  He does have a terminal ileum mass measuring 4.0 x 2.8 x 3.7 cm, and a mass of the mesentery of the small intestine measuring 3.9 x 2.3 x 4.0 cm.  As this has already metastasized to the liver, surgical resection was not indicated.  24 hour urine for 5 HIAA was quite elevated at 86.8 consistent with carcinoid syndrome.  Chromogranin A was also elevated at 214. He started treatment with octreotide injections every 4 weeks in October 2021.  Due to the persistent diarrhea, we increased the octreotide to 30 mg every 4 weeks. CT imaging in August 2022 was stable.  The chromogranin A from November 2022 was down to 118.6, but has been increasing this year.  CT scan in April 2023 remained stable.  The chromogranin A was 152 in July.  The patient was hospitalized in September. CT abdomen pelvis at that time revealed small-bowel obstruction, with transition point in the mid to distal jejunum adjacent to the known mesenteric carcinoid tumor. Stable, 3.9 cm, calcified central mesenteric mass, consistent with carcinoid tumor. Numerous liver masses, consistent with metastatic carcinoid tumor. Index lesions not appreciably changed since prior exam.   The chromogranin A was as low as 125 in March 2024, but increased to 142 in June.  He will proceed with octreotide this week as scheduled.  Chromogranin A is pending from today.  He will continue octreotide every 4 weeks.  We will plan to see him back in 8 weeks with a CBC, comprehensive metabolic panel and chromogranin A for repeat clinical assessment.

## 2022-12-10 NOTE — Progress Notes (Signed)
[facility]  [address] [place],  [place]  [address] [phone]  Clinic Day:  12/10/2022  Referring physician: Mikki Santee Key, *  ASSESSMENT & PLAN:   Assessment & Plan: Malignant carcinoid tumor of unknown primary site [facility] Metastatic carcinoid tumor consistent with gastrointestinal origin in September 2021.  He does have a terminal ileum mass measuring 4.0 x 2.8 x 3.7 cm, and a mass of the mesentery of the small intestine measuring 3.9 x 2.3 x 4.0 cm.  As this has already metastasized to the liver, surgical resection was not indicated.  24 hour urine for 5 HIAA was quite elevated at 86.8 consistent with carcinoid syndrome.  Chromogranin A was also elevated at 214. He started treatment with octreotide injections every 4 weeks in October 2021.  Due to the persistent diarrhea, we increased the octreotide to 30 mg every 4 weeks. CT imaging in August 2022 was stable.  The chromogranin A from November 2022 was down to 118.6, but has been increasing this year.  CT scan in April 2023 remained stable.  The chromogranin A was 152 in July.  The patient was hospitalized in September. CT abdomen pelvis at that time revealed small-bowel obstruction, with transition point in the mid to distal jejunum adjacent to the known mesenteric carcinoid tumor. Stable, 3.9 cm, calcified central mesenteric mass, consistent with carcinoid tumor. Numerous liver masses, consistent with metastatic carcinoid tumor. Index lesions not appreciably changed since prior exam.   The chromogranin A was as low as 125 in March 2024, but increased to 142 in June.  He will proceed with octreotide this week as scheduled.  Chromogranin A is pending from today.  He will continue octreotide every 4 weeks.  We will plan to see him back in 8 weeks with a CBC, comprehensive metabolic panel and chromogranin A for repeat clinical assessment.    The patient understands the plans discussed today and is in  agreement with them.  He knows to contact our office if he develops concerns prior to his next appointment.   I provided 15 minutes of face-to-face time during this encounter and > 50% was spent counseling as documented under my assessment and plan.    Adah Perl, PA-C  [facility] [address] [place] [address] Dept: [phone] Dept Fax: [phone]   No orders of the defined types were placed in this encounter.     CHIEF COMPLAINT:  CC: Malignant carcinoid  Current Treatment: Octreotide every 4 weeks  HISTORY OF PRESENT ILLNESS:  Glenn Bender is a 62 y.o. male with metastatic carcinoid to liver diagnosed in September 2021.  The patient presented in September with hematuria, felt to be due to a kidney stone.  CT abdomen and pelvis revealed multiple liver masses, the largest measuring 4.0 x 4.3 cm, a terminal ileum mass measuring 4.0 x 2.8 x 3.7 cm, and a mass of the mesentery of the small intestine measuring 3.9 x 2.3 x 4.0 cm.  Diagnostic colonoscopy with Dr. Vinson Moselle in September revealed a large mucosal covered mass protruding through the ileocecal valve with peristalsis.  Biopsy was collected and surgical pathology was benign.  Liver biopsy revealed metastatic neuroendocrine tumor, grade 2, consistent with gastrointestinal primary. Synaptophysin and chromogranin-A immunostains were positive as well as CDX-2 supporting gastrointestinal origin.  Ki67 was 6%.  He reports flushing of his face with alcohol and certain foods.  He also reported diarrhea.  24  hour urine for 5HIAA was also elevated at 86.8, which is consistent with carcinoid.  Chromogranin A was elevated at 214 in October and came down to 116.9 in December.  CT chest from November 2021 revealed left paratracheal adenopathy and pleural nodularity along the right hemidiaphragm measuring up to 12 mm, worrisome for metastatic disease. As he had metastatic  disease, resection of his primary was not recommended. The patient was started on octreotide 20 mg IM monthly in October.  Octreotide was subsequently increased to 30 mg monthly in December due to persistent diarrhea.   CT chest, abdomen and pelvis in February 2022 revealed mostly stable to slightly decreased appearance of the carcinoid tumor, including the mesenteric lesion.  The liver lesions were stable with no new lesions. There was stable nodularity/lobularity along the right hemidiaphragm.  He has had a continued decrease in the Chromogranin A, which was 111.4 in February, and then 100.3 in March, which is within normal limits.  He had improvement in his diarrhea with the increase in octreotide. The chromogranin A was up slightly in May to 126. CT chest, abdomen and pelvis in August revealed no new or progressive metastatic disease.  Findings are suggestive of multifocal small bowel carcinoid with conglomerate mesenteric nodal metastases.  Widespread hyperenhancing liver metastases are stable.  Stable clustered subpleural solid pulmonary nodules along the right hemidiaphragm in the basilar right lower lobe are compatible with pulmonary metastases.  There is new background diffuse hepatic steatosis. Chromogranin A was down to 107.5 in August.  The chromogranin A was 118 in November.   CT abdomen and pelvis in April 2023 revealed stable hepatic metastatic disease and stable hepatic steatosis.  There was a stable 3.6 cm partially calcified mesenteric mass, consistent with carcinoid tumor.  No evidence of new or progressive metastatic disease.  Bilateral nephrolithiasis, a small left inguinal hernia and aortic atherosclerosis were also seen.  He was hospitalized in September 2023 at [facility] with a small bowel obstruction.  CT abdomen and pelvis at that time revealed small-bowel obstruction, with transition point in the mid to distal jejunum adjacent to the known mesenteric carcinoid tumor. There was a stable  calcified central mesenteric mass consistent with carcinoid tumor, and numerous liver masses, consistent with metastatic carcinoid tumor. The index lesions are not appreciably changed since prior exam. Hepatic steatosis, a non-obstructing left renal calculus and aortic atherosclerosis were seen.  We have continued octreotide 30 mg every 4 weeks with good control of his diarrhea.  He had a follow-up x-ray for his left kidney stone in December, which was stable.    CT abdomen and pelvis in April revealed no significant change in the calcified retractile mass in the base of the mesentery, typical of carcinoid. No gross change in the previously demonstrated hepatic metastases, although evaluation is limited by the lack of intravenous contrast and interval improvement in background hepatic steatosis. Interval resolution of previously demonstrated small bowel distension. No evidence of progressive metastatic disease. Nonobstructing left renal calculi.  Aortic Atherosclerosis.  He reported passing kidney stone in June.  He initially had a decrease in the chromogranin A, but it has fluctuated up and down for the past year.   Oncology History  Malignant carcinoid tumor of unknown primary site [facility])  02/11/2020 Initial Diagnosis   Malignant carcinoid tumor of unknown primary site [facility])       INTERVAL HISTORY:  Delmar is here today for repeat clinical assessment. He continues to do wel and denies complaints.  He denies nausea,  vomiting, diarrhea or constipation. He reports stable neuropathy of the bilateral hands and feet. He denies fevers or chills. He denies pain. His appetite is good. His weight has been stable.  REVIEW OF SYSTEMS:  Review of Systems  Constitutional:  Negative for appetite change, chills, fatigue, fever and unexpected weight change.  HENT:   Negative for lump/mass, mouth sores and sore throat.   Respiratory:  Negative for cough and shortness of breath.   Cardiovascular:  Negative for  chest pain and leg swelling.  Gastrointestinal:  Negative for abdominal pain, constipation, diarrhea, nausea and vomiting.  Genitourinary:  Negative for difficulty urinating, dysuria, frequency and hematuria.   Musculoskeletal:  Negative for arthralgias, back pain and myalgias.  Skin:  Negative for itching, rash and wound.  Neurological:  Negative for dizziness, extremity weakness, headaches, light-headedness and numbness.  Hematological:  Negative for adenopathy.  Psychiatric/Behavioral:  Negative for depression and sleep disturbance. The patient is not nervous/anxious.      VITALS:  Blood pressure 138/87, pulse 86, temperature 97.9 F (36.6 C), temperature source Oral, resp. rate 18, height 5\' 10"  (1.778 m), weight 249 lb 11.2 oz (113.3 kg), SpO2 97%.  Wt Readings from Last 3 Encounters:  12/10/22 249 lb 11.2 oz (113.3 kg)  11/15/22 248 lb 4 oz (112.6 kg)  10/18/22 245 lb (111.1 kg)    Body mass index is 35.83 kg/m.  Performance status (ECOG): 1 - Symptomatic but completely ambulatory  PHYSICAL EXAM:  Physical Exam Vitals and nursing note reviewed.  Constitutional:      General: He is not in acute distress.    Appearance: Normal appearance. He is normal weight. He is not ill-appearing.  HENT:     Head: Normocephalic and atraumatic.     Mouth/Throat:     Mouth: Mucous membranes are moist.     Pharynx: Oropharynx is clear. No oropharyngeal exudate or posterior oropharyngeal erythema.  Eyes:     General: No scleral icterus.    Extraocular Movements: Extraocular movements intact.     Conjunctiva/sclera: Conjunctivae normal.     Pupils: Pupils are equal, round, and reactive to light.  Cardiovascular:     Rate and Rhythm: Normal rate and regular rhythm.     Heart sounds: Normal heart sounds. No murmur heard.    No friction rub. No gallop.  Pulmonary:     Effort: Pulmonary effort is normal.     Breath sounds: Normal breath sounds. No wheezing, rhonchi or rales.  Abdominal:      General: Bowel sounds are normal. There is no distension.     Palpations: Abdomen is soft. There is no hepatomegaly, splenomegaly or mass.     Tenderness: There is no abdominal tenderness.  Musculoskeletal:        General: Normal range of motion.     Cervical back: Normal range of motion and neck supple. No tenderness.     Right lower leg: No edema.     Left lower leg: No edema.  Lymphadenopathy:     Cervical: No cervical adenopathy.     Upper Body:     Right upper body: No supraclavicular or axillary adenopathy.     Left upper body: No supraclavicular or axillary adenopathy.     Lower Body: No right inguinal adenopathy. No left inguinal adenopathy.  Skin:    General: Skin is warm and dry.     Coloration: Skin is not jaundiced.     Findings: No rash.  Neurological:     Mental Status:  He is alert and oriented to person, place, and time.     Cranial Nerves: No cranial nerve deficit.  Psychiatric:        Mood and Affect: Mood normal.        Behavior: Behavior normal.        Thought Content: Thought content normal.     LABS:      Latest Ref Rng & Units 12/10/2022    2:18 PM 10/15/2022    1:24 PM 09/18/2022    1:26 PM  CBC  WBC 4.0 - 10.5 K/uL 5.8  5.7  8.8   Hemoglobin 13.0 - 17.0 g/dL 16.1  09.6  04.5   Hematocrit 39.0 - 52.0 % 42.5  44.0  45.8   Platelets 150 - 400 K/uL 133  130  153       Latest Ref Rng & Units 12/10/2022    2:18 PM 10/15/2022    1:24 PM 09/18/2022    1:26 PM  CMP  Glucose 70 - 99 mg/dL 409  811  66   BUN 8 - 23 mg/dL 21  18  21    Creatinine 0.61 - 1.24 mg/dL 9.14  7.82  9.56   Sodium 135 - 145 mmol/L 138  138  140   Potassium 3.5 - 5.1 mmol/L 4.2  3.9  3.8   Chloride 98 - 111 mmol/L 108  111  112   CO2 22 - 32 mmol/L 21  21  23    Calcium 8.9 - 10.3 mg/dL 8.4  8.2  8.6   Total Protein 6.5 - 8.1 g/dL 6.1  6.6  6.8   Total Bilirubin 0.3 - 1.2 mg/dL 0.7  1.1  0.4   Alkaline Phos 38 - 126 U/L 42  49  45   AST 15 - 41 U/L 29  27  24    ALT 0 - 44 U/L  34  36  28      No results found for: "CEA1", "CEA" / No results found for: "CEA1", "CEA" No results found for: "PSA1" No results found for: "OZH086" No results found for: "CAN125"  No results found for: "TOTALPROTELP", "ALBUMINELP", "A1GS", "A2GS", "BETS", "BETA2SER", "GAMS", "MSPIKE", "SPEI" No results found for: "TIBC", "FERRITIN", "IRONPCTSAT" No results found for: "LDH"  STUDIES:  No results found.    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)    Malignant nonargentaffin carcinoid tumor (HCC)    Malignant nonargentaffin carcinoid tumor (HCC)    Secondary malignant carcinoid tumor of liver [facility])     Past Surgical History:  Procedure Laterality Date   CYSTOTOMY  01/2020   IR URETERAL STENT LEFT NEW ACCESS W/O SEP NEPHROSTOMY CATH  06/13/2020   IR URETERAL STENT PLACEMENT EXISTING ACCESS LEFT  06/15/2020   LYMPH NODE BIOPSY Left    supraclavicular   NEPHROLITHOTOMY Left 06/13/2020   Procedure: NEPHROLITHOTOMY PERCUTANEOUS;  Surgeon: Noel Christmas, MD;  Location: [facility];  Service: Urology;  Laterality: Left;  2 HRS   ORCHIECTOMY  1996   UMBILICAL HERNIA REPAIR      Family History  Problem Relation Age of Onset   Cancer Mother        jaw bone   Throat cancer Father  smoker    Social History:  reports that he quit smoking about 27 years ago. His smoking use included cigarettes. He started smoking about 46 years ago. He has a 4.8 pack-year smoking history. He has never used smokeless tobacco. He reports current alcohol use. He reports that he does not use drugs.The patient is alone today.  Allergies: No Known Allergies  Current Medications: Current Outpatient Medications  Medication Sig Dispense Refill   octreotide (SANDOSTATIN LAR) 20 MG injection Inject 20 mg  into the muscle every 28 (twenty-eight) days.     tamsulosin (FLOMAX) 0.4 MG CAPS capsule SMARTSIG:1 Tablet(s) By Mouth Every Evening     No current facility-administered medications for this visit.

## 2022-12-11 ENCOUNTER — Encounter: Payer: Self-pay | Admitting: Oncology

## 2022-12-13 ENCOUNTER — Inpatient Hospital Stay: Payer: 59

## 2022-12-13 VITALS — BP 130/76 | HR 56 | Temp 97.7°F | Resp 18 | Wt 252.0 lb

## 2022-12-13 DIAGNOSIS — C7A00 Malignant carcinoid tumor of unspecified site: Secondary | ICD-10-CM | POA: Diagnosis not present

## 2022-12-13 DIAGNOSIS — C7B8 Other secondary neuroendocrine tumors: Secondary | ICD-10-CM

## 2022-12-13 MED ORDER — OCTREOTIDE ACETATE 30 MG IM KIT
30.0000 mg | PACK | Freq: Once | INTRAMUSCULAR | Status: AC
  Administered 2022-12-13: 30 mg via INTRAMUSCULAR

## 2022-12-13 NOTE — Patient Instructions (Signed)
Octreotide Injection Solution What is this medication? OCTREOTIDE (ok TREE oh tide) treats high levels of growth hormone (acromegaly). It works by reducing the amount of growth hormone your body makes. This reduces symptoms and the risk of health problems caused by too much growth hormone, such as diabetes and heart disease. It may also be used to treat diarrhea caused by neuroendocrine tumors. It works by slowing down the release of serotonin from the tumor cells. This reduces the number of bowel movements you have. This medicine may be used for other purposes; ask your health care provider or pharmacist if you have questions. COMMON BRAND NAME(S): Bynfezia, Sandostatin What should I tell my care team before I take this medication? They need to know if you have any of these conditions: Diabetes Gallbladder disease Kidney disease Liver disease Thyroid disease An unusual or allergic reaction to octreotide, other medications, foods, dyes, or preservatives Pregnant or trying to get pregnant Breast-feeding How should I use this medication? This medication is injected under the skin or into a vein. It is usually given by your care team in a hospital or clinic setting. If you get this medication at home, you will be taught how to prepare and give it. Use exactly as directed. Take it as directed on the prescription label at the same time every day. Keep taking it unless your care team tells you to stop. Allow the injection solution to come to room temperature before use. Do not warm it artificially. It is important that you put your used needles and syringes in a special sharps container. Do not put them in a trash can. If you do not have a sharps container, call your pharmacist or care team to get one. Talk to your care team about the use of this medication in children. Special care may be needed. Overdosage: If you think you have taken too much of this medicine contact a poison control center or  emergency room at once. NOTE: This medicine is only for you. Do not share this medicine with others. What if I miss a dose? If you miss a dose, take it as soon as you can. If it is almost time for your next dose, take only that dose. Do not take double or extra doses. What may interact with this medication? Bromocriptine Certain medications for blood pressure, heart disease, irregular heartbeat Cyclosporine Diuretics Medications for diabetes, including insulin Quinidine This list may not describe all possible interactions. Give your health care provider a list of all the medicines, herbs, non-prescription drugs, or dietary supplements you use. Also tell them if you smoke, drink alcohol, or use illegal drugs. Some items may interact with your medicine. What should I watch for while using this medication? Visit your care team for regular checks on your progress. Tell your care team if your symptoms do not start to get better or if they get worse. To help reduce irritation at the injection site, use a different site for each injection and make sure the solution is at room temperature before use. This medication may cause decreases in blood sugar. Signs of low blood sugar include chills, cool, pale skin or cold sweats, drowsiness, extreme hunger, fast heartbeat, headache, nausea, nervousness or anxiety, shakiness, trembling, unsteadiness, tiredness, or weakness. Contact your care team right away if you experience any of these symptoms. This medication may increase blood sugar. The risk may be higher in patients who already have diabetes. Ask your care team what you can do to lower your   risk of diabetes while taking this medication. You should make sure you get enough vitamin B12 while you are taking this medication. Discuss the foods you eat and the vitamins you take with your care team. What side effects may I notice from receiving this medication? Side effects that you should report to your care  team as soon as possible: Allergic reactions--skin rash, itching, hives, swelling of the face, lips, tongue, or throat Gallbladder problems--severe stomach pain, nausea, vomiting, fever Heart rhythm changes--fast or irregular heartbeat, dizziness, feeling faint or lightheaded, chest pain, trouble breathing High blood sugar (hyperglycemia)--increased thirst or amount of urine, unusual weakness or fatigue, blurry vision Low blood sugar (hypoglycemia)--tremors or shaking, anxiety, sweating, cold or clammy skin, confusion, dizziness, rapid heartbeat Low thyroid levels (hypothyroidism)--unusual weakness or fatigue, increased sensitivity to cold, constipation, hair loss, dry skin, weight gain, feelings of depression Low vitamin B12 level--pain, tingling, or numbness in the hands or feet, muscle weakness, dizziness, confusion, trouble concentrating Pancreatitis--severe stomach pain that spreads to your back or gets worse after eating or when touched, fever, nausea, vomiting Side effects that usually do not require medical attention (report to your care team if they continue or are bothersome): Diarrhea Dizziness Gas Headache Pain, redness, or irritation at injection site Stomach pain This list may not describe all possible side effects. Call your doctor for medical advice about side effects. You may report side effects to FDA at 1-800-FDA-1088. Where should I keep my medication? Keep out of the reach of children and pets. Store in the refrigerator. Protect from light. Allow to come to room temperature naturally. Do not use artificial heat. If protected from light, the injection may be stored between 20 and 30 degrees C (70 and 86 degrees F) for 14 days. After the initial use, throw away any unused portion of a multiple dose vial after 14 days. Get rid of any unused portions of the ampules after use. To get rid of medications that are no longer needed or have expired: Take the medication to a medication  take-back program. Ask your pharmacy or law enforcement to find a location. If you cannot return the medication, ask your pharmacist or care team how to get rid of the medication safely. NOTE: This sheet is a summary. It may not cover all possible information. If you have questions about this medicine, talk to your doctor, pharmacist, or health care provider.  2024 [employer] Standard (2021-07-19 00:00:00)  

## 2022-12-17 ENCOUNTER — Other Ambulatory Visit: Payer: Self-pay | Admitting: Hematology and Oncology

## 2022-12-17 DIAGNOSIS — C7A00 Malignant carcinoid tumor of unspecified site: Secondary | ICD-10-CM

## 2022-12-17 DIAGNOSIS — C7B8 Other secondary neuroendocrine tumors: Secondary | ICD-10-CM

## 2023-01-10 ENCOUNTER — Inpatient Hospital Stay: Payer: 59 | Attending: Hematology and Oncology

## 2023-01-10 VITALS — BP 129/77 | HR 59 | Temp 97.9°F | Resp 18 | Ht 70.0 in | Wt 248.1 lb

## 2023-01-10 DIAGNOSIS — C7A00 Malignant carcinoid tumor of unspecified site: Secondary | ICD-10-CM | POA: Diagnosis present

## 2023-01-10 DIAGNOSIS — E340 Carcinoid syndrome: Secondary | ICD-10-CM | POA: Diagnosis present

## 2023-01-10 DIAGNOSIS — C7B02 Secondary carcinoid tumors of liver: Secondary | ICD-10-CM | POA: Diagnosis present

## 2023-01-10 DIAGNOSIS — C7B8 Other secondary neuroendocrine tumors: Secondary | ICD-10-CM

## 2023-01-10 MED ORDER — OCTREOTIDE ACETATE 30 MG IM KIT
30.0000 mg | PACK | Freq: Once | INTRAMUSCULAR | Status: AC
  Administered 2023-01-10: 30 mg via INTRAMUSCULAR
  Filled 2023-01-10: qty 1

## 2023-01-10 NOTE — Patient Instructions (Signed)
Octreotide Injection Solution What is this medication? OCTREOTIDE (ok TREE oh tide) treats high levels of growth hormone (acromegaly). It works by reducing the amount of growth hormone your body makes. This reduces symptoms and the risk of health problems caused by too much growth hormone, such as diabetes and heart disease. It may also be used to treat diarrhea caused by neuroendocrine tumors. It works by slowing down the release of serotonin from the tumor cells. This reduces the number of bowel movements you have. This medicine may be used for other purposes; ask your health care provider or pharmacist if you have questions. COMMON BRAND NAME(S): Berline Lopes, Sandostatin What should I tell my care team before I take this medication? They need to know if you have any of these conditions: Diabetes Gallbladder disease Kidney disease Liver disease Thyroid disease An unusual or allergic reaction to octreotide, other medications, foods, dyes, or preservatives Pregnant or trying to get pregnant Breast-feeding How should I use this medication? This medication is injected under the skin or into a vein. It is usually given by your care team in a hospital or clinic setting. If you get this medication at home, you will be taught how to prepare and give it. Use exactly as directed. Take it as directed on the prescription label at the same time every day. Keep taking it unless your care team tells you to stop. Allow the injection solution to come to room temperature before use. Do not warm it artificially. It is important that you put your used needles and syringes in a special sharps container. Do not put them in a trash can. If you do not have a sharps container, call your pharmacist or care team to get one. Talk to your care team about the use of this medication in children. Special care may be needed. Overdosage: If you think you have taken too much of this medicine contact a poison control center or  emergency room at once. NOTE: This medicine is only for you. Do not share this medicine with others. What if I miss a dose? If you miss a dose, take it as soon as you can. If it is almost time for your next dose, take only that dose. Do not take double or extra doses. What may interact with this medication? Bromocriptine Certain medications for blood pressure, heart disease, irregular heartbeat Cyclosporine Diuretics Medications for diabetes, including insulin Quinidine This list may not describe all possible interactions. Give your health care provider a list of all the medicines, herbs, non-prescription drugs, or dietary supplements you use. Also tell them if you smoke, drink alcohol, or use illegal drugs. Some items may interact with your medicine. What should I watch for while using this medication? Visit your care team for regular checks on your progress. Tell your care team if your symptoms do not start to get better or if they get worse. To help reduce irritation at the injection site, use a different site for each injection and make sure the solution is at room temperature before use. This medication may cause decreases in blood sugar. Signs of low blood sugar include chills, cool, pale skin or cold sweats, drowsiness, extreme hunger, fast heartbeat, headache, nausea, nervousness or anxiety, shakiness, trembling, unsteadiness, tiredness, or weakness. Contact your care team right away if you experience any of these symptoms. This medication may increase blood sugar. The risk may be higher in patients who already have diabetes. Ask your care team what you can do to lower your  risk of diabetes while taking this medication. You should make sure you get enough vitamin B12 while you are taking this medication. Discuss the foods you eat and the vitamins you take with your care team. What side effects may I notice from receiving this medication? Side effects that you should report to your care  team as soon as possible: Allergic reactions--skin rash, itching, hives, swelling of the face, lips, tongue, or throat Gallbladder problems--severe stomach pain, nausea, vomiting, fever Heart rhythm changes--fast or irregular heartbeat, dizziness, feeling faint or lightheaded, chest pain, trouble breathing High blood sugar (hyperglycemia)--increased thirst or amount of urine, unusual weakness or fatigue, blurry vision Low blood sugar (hypoglycemia)--tremors or shaking, anxiety, sweating, cold or clammy skin, confusion, dizziness, rapid heartbeat Low thyroid levels (hypothyroidism)--unusual weakness or fatigue, increased sensitivity to cold, constipation, hair loss, dry skin, weight gain, feelings of depression Low vitamin B12 level--pain, tingling, or numbness in the hands or feet, muscle weakness, dizziness, confusion, trouble concentrating Pancreatitis--severe stomach pain that spreads to your back or gets worse after eating or when touched, fever, nausea, vomiting Side effects that usually do not require medical attention (report to your care team if they continue or are bothersome): Diarrhea Dizziness Gas Headache Pain, redness, or irritation at injection site Stomach pain This list may not describe all possible side effects. Call your doctor for medical advice about side effects. You may report side effects to FDA at 1-800-FDA-1088. Where should I keep my medication? Keep out of the reach of children and pets. Store in the refrigerator. Protect from light. Allow to come to room temperature naturally. Do not use artificial heat. If protected from light, the injection may be stored between 20 and 30 degrees C (70 and 86 degrees F) for 14 days. After the initial use, throw away any unused portion of a multiple dose vial after 14 days. Get rid of any unused portions of the ampules after use. To get rid of medications that are no longer needed or have expired: Take the medication to a medication  take-back program. Ask your pharmacy or law enforcement to find a location. If you cannot return the medication, ask your pharmacist or care team how to get rid of the medication safely. NOTE: This sheet is a summary. It may not cover all possible information. If you have questions about this medicine, talk to your doctor, pharmacist, or health care provider.  2024 [employer] Standard (2021-07-19 00:00:00)

## 2023-01-23 ENCOUNTER — Telehealth: Payer: Self-pay | Admitting: Oncology

## 2023-01-23 NOTE — Telephone Encounter (Signed)
CT A/P has been scheduled for 02/03/23 @ 2:30; Checking in @ 1:30   LVM notifying pt of date,time and instructions.

## 2023-02-03 LAB — CBC AND DIFFERENTIAL
HCT: 43 (ref 41–53)
Hemoglobin: 14.9 (ref 13.5–17.5)
Neutrophils Absolute: 4.68
Platelets: 126 10*3/uL — AB (ref 150–400)
WBC: 6.5

## 2023-02-03 LAB — BASIC METABOLIC PANEL
BUN: 20 (ref 4–21)
CO2: 21 (ref 13–22)
Chloride: 110 — AB (ref 99–108)
Creatinine: 1.6 — AB (ref 0.6–1.3)
Glucose: 100
Potassium: 4.2 meq/L (ref 3.5–5.1)
Sodium: 136 — AB (ref 137–147)

## 2023-02-03 LAB — COMPREHENSIVE METABOLIC PANEL
Albumin: 4.1 (ref 3.5–5.0)
Calcium: 9 (ref 8.7–10.7)
EGFR: 44

## 2023-02-03 LAB — CBC: RBC: 4.88 (ref 3.87–5.11)

## 2023-02-03 LAB — HEPATIC FUNCTION PANEL
ALT: 47 U/L — AB (ref 10–40)
AST: 38 (ref 14–40)
Alkaline Phosphatase: 45 (ref 25–125)
Bilirubin, Total: 1

## 2023-02-05 ENCOUNTER — Other Ambulatory Visit: Payer: 59

## 2023-02-05 ENCOUNTER — Encounter: Payer: Self-pay | Admitting: Oncology

## 2023-02-05 ENCOUNTER — Inpatient Hospital Stay: Payer: 59 | Attending: Oncology | Admitting: Oncology

## 2023-02-05 ENCOUNTER — Ambulatory Visit: Payer: 59 | Admitting: Hematology and Oncology

## 2023-02-05 VITALS — BP 133/82 | HR 64 | Temp 97.8°F | Resp 18 | Ht 70.0 in | Wt 251.1 lb

## 2023-02-05 DIAGNOSIS — C7B8 Other secondary neuroendocrine tumors: Secondary | ICD-10-CM | POA: Diagnosis not present

## 2023-02-05 DIAGNOSIS — C7B02 Secondary carcinoid tumors of liver: Secondary | ICD-10-CM | POA: Insufficient documentation

## 2023-02-05 DIAGNOSIS — C7A00 Malignant carcinoid tumor of unspecified site: Secondary | ICD-10-CM

## 2023-02-05 NOTE — Progress Notes (Signed)
[facility]  [address] [place],  [place]  [address] [phone]  Clinic Day: 02/05/23  Referring physician: Mikki Santee Bender, *  ASSESSMENT & PLAN:  Assessment & Plan: Malignant carcinoid tumor of unknown primary site [facility]) Metastatic carcinoid tumor consistent with gastrointestinal origin in September 2021.  He does have a terminal ileum mass measuring 4.0 x 2.8 x 3.7 cm, and a mass of the mesentery of the small intestine measuring 3.9 x 2.3 x 4.0 cm.  As this has already metastasized to the liver, surgical resection was not indicated.  24 hour urine for 5 HIAA was quite elevated at 86.8 consistent with carcinoid syndrome.  Chromogranin A was also elevated at 214. He started treatment with octreotide injections every 4 weeks in October 2021.  Due to the persistent diarrhea, we increased the octreotide to 30 mg every 4 weeks. CT imaging in August 2022 was stable.  The chromogranin A from November 2022 was down to 118.6, but has been mildly increasing this year.  CT scan in April 2023 remained stable.  The chromogranin A was 152 in July.  The patient was hospitalized in September. CT abdomen pelvis at that time revealed small-bowel obstruction, with transition point in the mid to distal jejunum adjacent to the known mesenteric carcinoid tumor. There is a stable, 3.9 cm, calcified central mesenteric mass, consistent with carcinoid tumor, and numerous liver masses, consistent with metastatic carcinoid tumor. The index lesions are not appreciably changed since prior exam. The chromogranin A was up to 169 in October of 2023, but went down to 145.4 in December, 2023 and 124.9 in March, 2024. CT scan from 08/19/22 remains stable. The chromogranin A was 152 in July.  The patient was hospitalized in September. CT abdomen and pelvis at that time revealed small-bowel obstruction, with transition point in the mid to distal jejunum adjacent to the known mesenteric carcinoid  tumor. Stable, 3.9 cm, calcified central mesenteric mass, consistent with carcinoid tumor. Numerous liver masses, consistent with metastatic carcinoid tumor. Index lesions not appreciably changed since prior exam. He had a CT abdomen and pelvis on 02/03/2023 that revealed a stable calcified mesenteric mass and shotty sub-centimeter mesenteric lymph nodes consistent with his carcinoid tumor and no new or progressive metastatic disease within the abdomen or pelvis. His chromogranin A is elevated at 166.7, slightly higher than previous.    Secondary neuroendocrine tumor of liver (HCC) Multiple liver masses, the largest measuring 4.0 x 4.3 cm, with metastatic carcinoid and carcinoid syndrome, which were stable on CT imaging in April of 2024 and October 2024.       Thrombocytopenia This is mild and will need to be monitored. His platelets were up to 153,000 in May, 2023 but are 126,000 as of October, 2024.   Plan: He continues Sandostatin injections every 4 weeks. He had a CT abdomen and pelvis on 02/03/2023 that revealed a stable calcified mesenteric mass and shotty sub-centimeter mesenteric lymph nodes consistent with his carcinoid tumor and no new or progressive metastatic disease within the abdomen or pelvis. His WBC is at 6.5, hemoglobin is 14.9, and platelet count is mildly low at 126,000 as of 02/03/2023. He has a elevated creatinine of 1.6 with a BUN of 20 and an ALT of 47. The rest of his CMP is normal. His chromogranin A is elevated at 166.7, slightly higher than previous. I will see him back in 3 months with CBC, CMP, B-12, and folate level. The patient understands the plans discussed  today and is in agreement with them.  He knows to contact our office if he develops concerns prior to his next appointment.  I provided 15 minutes of face-to-face time during this encounter and > 50% was spent counseling as documented under my assessment and plan.    Glenn Beckwith, MD  [facility] [address] [place] [address] Dept: [phone] Dept Fax: [phone]   No orders of the defined types were placed in this encounter.   CHIEF COMPLAINT:  CC: Malignant carcinoid  Current Treatment: Octreotide every 4 weeks  HISTORY OF PRESENT ILLNESS:  Glenn Bender is a 62 y.o. male with metastatic carcinoid to liver diagnosed in September 2021.  The patient presented in September with hematuria, felt to be due to a kidney stone.  CT abdomen and pelvis revealed multiple liver masses, the largest measuring 4.0 x 4.3 cm, a terminal ileum mass measuring 4.0 x 2.8 x 3.7 cm, and a mass of the mesentery of the small intestine measuring 3.9 x 2.3 x 4.0 cm.  Diagnostic colonoscopy with Dr. Vinson Bender in September revealed a large mucosal covered mass protruding through the ileocecal valve with peristalsis.  Biopsy was collected and surgical pathology was benign.  Liver biopsy revealed metastatic neuroendocrine tumor, grade 2, consistent with gastrointestinal primary. Synaptophysin and chromogranin-A immunostains were positive as well as CDX-2 supporting gastrointestinal origin.  Ki67 was 6%.  He reports flushing of his face with alcohol and certain foods.  He also reported diarrhea.  24 hour urine for 5HIAA was also elevated at 86.8, which is consistent with carcinoid.  Chromogranin A was elevated at 214 in October and came down to 116.9 in December.  CT chest from November 2021 revealed left paratracheal adenopathy and pleural nodularity along the right hemidiaphragm measuring up to 12 mm, worrisome for metastatic disease. As he had metastatic disease, resection of his primary was not recommended. The patient was started on octreotide 20 mg IM monthly in October.  Octreotide was subsequently increased to 30 mg monthly in December due to persistent diarrhea.   CT chest, abdomen and pelvis in February 2022 revealed mostly stable to slightly  decreased appearance of the carcinoid tumor, including the mesenteric lesion.  The liver lesions were stable with no new lesions. There was stable nodularity/lobularity along the right hemidiaphragm.  He has had a continued decrease in the Chromogranin A, which was 111.4 in February, and then 100.3 in March, which is within normal limits.  He had improvement in his diarrhea with the increase in octreotide. The chromogranin A was up slightly in May to 126. CT chest, abdomen and pelvis in August revealed no new or progressive metastatic disease.  Findings are suggestive of multifocal small bowel carcinoid with conglomerate mesenteric nodal metastases.  Widespread hyperenhancing liver metastases are stable.  Stable clustered subpleural solid pulmonary nodules along the right hemidiaphragm in the basilar right lower lobe are compatible with pulmonary metastases.  There is new background diffuse hepatic steatosis. Chromogranin A was down to 107.5 in August.  The chromogranin A was 118 in November.   CT abdomen and pelvis in April 2023 revealed stable hepatic metastatic disease and stable hepatic steatosis.  There was a stable 3.6 cm partially calcified mesenteric mass, consistent with carcinoid tumor.  No evidence of new or progressive metastatic disease.  Bilateral nephrolithiasis, a small left inguinal hernia and aortic atherosclerosis were also seen.  He was hospitalized in September 2023 at [facility]  with a small bowel obstruction.  CT abdomen and pelvis at that time revealed small-bowel obstruction, with transition point in the mid to distal jejunum adjacent to the known mesenteric carcinoid tumor. There was a stable calcified central mesenteric mass consistent with carcinoid tumor, and numerous liver masses, consistent with metastatic carcinoid tumor. The index lesions are not appreciably changed since prior exam. Hepatic steatosis, a non-obstructing left renal calculus and aortic atherosclerosis were seen.  We  have continued octreotide 30 mg every 4 weeks with good control of his diarrhea.  He had a follow-up x-ray for his left kidney stone in December, which was stable.    CT abdomen and pelvis in April revealed no significant change in the calcified retractile mass in the base of the mesentery, typical of carcinoid. No gross change in the previously demonstrated hepatic metastases, although evaluation is limited by the lack of intravenous contrast and interval improvement in background hepatic steatosis. Interval resolution of previously demonstrated small bowel distension. No evidence of progressive metastatic disease. Nonobstructing left renal calculi.  Aortic Atherosclerosis.  He reported passing kidney stone in June.  He initially had a decrease in the chromogranin A, but it has fluctuated up and down for the past year.  Oncology History  Malignant carcinoid tumor of unknown primary site [facility])  02/11/2020 Initial Diagnosis   Malignant carcinoid tumor of unknown primary site [facility])     INTERVAL HISTORY:  Glenn Bender is here today for repeat clinical assessment for malignant carcinoid. Patient states that he feels well and has no complaints of pain. He continues Sandostatin injections every 4 weeks. He had a CT abdomen and pelvis on 02/03/2023 that revealed a stable calcified mesenteric mass and shotty sub-centimeter mesenteric lymph nodes consistent with his carcinoid tumor and no new or progressive metastatic disease within the abdomen or pelvis. His WBC is 6.5, hemoglobin is 14.9, and platelet count is mildly low at 126,000 as of 02/03/2023. He has a elevated creatinine of 1.6 with a BUN of 20 and an ALT of 47. The rest of his CMP is normal. His chromogranin A is elevated at 166.7, slightly higher than previous. I will see him back in 3 months with CBC, CMP, B-12, and folate level. He denies signs of infection such as sore throat, sinus drainage, cough, or urinary symptoms.  He denies fevers or recurrent chills.  He denies pain. He denies nausea, vomiting, chest pain, dyspnea or cough. His appetite is great and her weight has been stable, has increased 3 pounds over last 3.5 weeks .  REVIEW OF SYSTEMS:  Review of Systems  Constitutional: Negative.  Negative for appetite change, chills, diaphoresis, fatigue, fever and unexpected weight change.  HENT:  Negative.  Negative for hearing loss, lump/mass, mouth sores, nosebleeds, sore throat, tinnitus, trouble swallowing and voice change.   Eyes: Negative.  Negative for eye problems and icterus.  Respiratory: Negative.  Negative for chest tightness, cough, hemoptysis, shortness of breath and wheezing.   Cardiovascular: Negative.  Negative for chest pain, leg swelling and palpitations.  Gastrointestinal: Negative.  Negative for abdominal distention, abdominal pain, blood in stool, constipation, diarrhea, nausea, rectal pain and vomiting.  Endocrine: Negative.  Negative for hot flashes.  Genitourinary: Negative.  Negative for bladder incontinence, difficulty urinating, dyspareunia, dysuria, frequency, hematuria, nocturia, pelvic pain and penile discharge.   Musculoskeletal: Negative.  Negative for arthralgias, back pain, flank pain, gait problem, myalgias, neck pain and neck stiffness.  Skin: Negative.  Negative for itching, rash and wound.  Neurological: Negative.  Negative for dizziness, extremity weakness, gait problem, headaches, light-headedness, numbness, seizures and speech difficulty.  Hematological: Negative.  Negative for adenopathy. Does not bruise/bleed easily.  Psychiatric/Behavioral: Negative.  Negative for confusion, decreased concentration, depression, sleep disturbance and suicidal ideas. The patient is not nervous/anxious.     VITALS:  Blood pressure 133/82, pulse 64, temperature 97.8 F (36.6 C), temperature source Oral, resp. rate 18, height 5\' 10"  (1.778 m), weight 251 lb 1.6 oz (113.9 kg), SpO2 98%.  Wt Readings from Last 3 Encounters:   02/07/23 251 lb 0.6 oz (113.9 kg)  02/05/23 251 lb 1.6 oz (113.9 kg)  01/10/23 248 lb 1.9 oz (112.5 kg)    Body mass index is 36.03 kg/m.  Performance status (ECOG): 1 - Symptomatic but completely ambulatory  PHYSICAL EXAM:  Physical Exam Vitals and nursing note reviewed.  Constitutional:      General: He is not in acute distress.    Appearance: Normal appearance. He is normal weight. He is not ill-appearing, toxic-appearing or diaphoretic.  HENT:     Head: Normocephalic and atraumatic.     Right Ear: Tympanic membrane, ear canal and external ear normal. There is no impacted cerumen.     Left Ear: Tympanic membrane, ear canal and external ear normal. There is no impacted cerumen.     Nose: Nose normal. No congestion or rhinorrhea.     Mouth/Throat:     Mouth: Mucous membranes are moist.     Pharynx: Oropharynx is clear. No oropharyngeal exudate or posterior oropharyngeal erythema.  Eyes:     General: No scleral icterus.       Right eye: No discharge.        Left eye: No discharge.     Extraocular Movements: Extraocular movements intact.     Conjunctiva/sclera: Conjunctivae normal.     Pupils: Pupils are equal, round, and reactive to light.  Neck:     Vascular: No carotid bruit.  Cardiovascular:     Rate and Rhythm: Normal rate and regular rhythm.     Pulses: Normal pulses.     Heart sounds: Normal heart sounds. No murmur heard.    No friction rub. No gallop.  Pulmonary:     Effort: Pulmonary effort is normal. No respiratory distress.     Breath sounds: Normal breath sounds. No stridor. No wheezing, rhonchi or rales.  Chest:     Chest wall: No tenderness.  Abdominal:     General: Bowel sounds are normal. There is no distension.     Palpations: Abdomen is soft. There is no hepatomegaly, splenomegaly or mass.     Tenderness: There is no abdominal tenderness. There is no right CVA tenderness, left CVA tenderness, guarding or rebound.     Hernia: No hernia is present.   Musculoskeletal:        General: No swelling, tenderness, deformity or signs of injury. Normal range of motion.     Cervical back: Normal range of motion and neck supple. No rigidity or tenderness.     Right lower leg: No edema.     Left lower leg: No edema.  Lymphadenopathy:     Cervical: No cervical adenopathy.     Upper Body:     Right upper body: No supraclavicular or axillary adenopathy.     Left upper body: No supraclavicular or axillary adenopathy.     Lower Body: No right inguinal adenopathy. No left inguinal adenopathy.  Skin:    General: Skin is warm and dry.  Coloration: Skin is not jaundiced or pale.     Findings: No bruising, erythema, lesion or rash.  Neurological:     General: No focal deficit present.     Mental Status: He is alert and oriented to person, place, and time. Mental status is at baseline.     Cranial Nerves: No cranial nerve deficit.     Sensory: No sensory deficit.     Motor: No weakness.     Coordination: Coordination normal.     Gait: Gait normal.     Deep Tendon Reflexes: Reflexes normal.  Psychiatric:        Mood and Affect: Mood normal.        Behavior: Behavior normal.        Thought Content: Thought content normal.        Judgment: Judgment normal.    LABS:      Latest Ref Rng & Units 02/03/2023   12:00 AM 12/10/2022    2:18 PM 10/15/2022    1:24 PM  CBC  WBC  6.5     5.8  5.7   Hemoglobin 13.5 - 17.5 14.9     13.9  14.4   Hematocrit 41 - 53 43     42.5  44.0   Platelets 150 - 400 K/uL 126     133  130      This result is from an external source.      Latest Ref Rng & Units 02/03/2023   12:00 AM 12/10/2022    2:18 PM 10/15/2022    1:24 PM  CMP  Glucose 70 - 99 mg/dL  161  096   BUN 4 - 21 20     21  18    Creatinine 0.6 - 1.3 1.6     1.59  1.66   Sodium 137 - 147 136     138  138   Potassium 3.5 - 5.1 mEq/L 4.2     4.2  3.9   Chloride 99 - 108 110     108  111   CO2 13 - 22 21     21  21    Calcium 8.7 - 10.7 9.0     8.4   8.2   Total Protein 6.5 - 8.1 g/dL  6.1  6.6   Total Bilirubin 0.3 - 1.2 mg/dL  0.7  1.1   Alkaline Phos 25 - 125 45     42  49   AST 14 - 40 38     29  27   ALT 10 - 40 U/L 47     34  36      This result is from an external source.   No results found for: "CEA1", "CEA" / No results found for: "CEA1", "CEA" No results found for: "PSA1" No results found for: "EAV409" No results found for: "CAN125"  No results found for: "TOTALPROTELP", "ALBUMINELP", "A1GS", "A2GS", "BETS", "BETA2SER", "GAMS", "MSPIKE", "SPEI" No results found for: "TIBC", "FERRITIN", "IRONPCTSAT" No results found for: "LDH"  STUDIES:  Exam: 02/03/2023 CT Abdomen and Pelvis with Contrast Impression: Stable calcified mesenteric mass and shotty sub-centimeter mesenteric lymph nodes consistent with carcinoid tumor.  Diffuse hepatic metastases and hepatic steatosis, without significant change.  No new or progressive metastatic disease within the abdomen or pelvis. Left nephrolithiasis. No evidence of ureteral calculi or hydronephrosis. Small left inguinal hernia, which contains only fat.       HISTORY:   Past Medical History:  Diagnosis Date  Arthritis    Rt foot   History of COVID-19 2020   hospitalized for 1 month   History of kidney stones    History of testicular cancer 1996   Malignant carcinoid tumor of the ileum (HCC)    Malignant nonargentaffin carcinoid tumor (HCC)    Malignant nonargentaffin carcinoid tumor (HCC)    Secondary malignant carcinoid tumor of liver [facility])     Past Surgical History:  Procedure Laterality Date   CYSTOTOMY  01/2020   IR URETERAL STENT LEFT NEW ACCESS W/O SEP NEPHROSTOMY CATH  06/13/2020   IR URETERAL STENT PLACEMENT EXISTING ACCESS LEFT  06/15/2020   LYMPH NODE BIOPSY Left    supraclavicular   NEPHROLITHOTOMY Left 06/13/2020   Procedure: NEPHROLITHOTOMY PERCUTANEOUS;  Surgeon: Noel Christmas, MD;  Location: [facility];  Service: Urology;  Laterality: Left;  2 HRS    ORCHIECTOMY  1996   UMBILICAL HERNIA REPAIR      Family History  Problem Relation Age of Onset   Cancer Mother        jaw bone   Throat cancer Father        smoker    Social History:  reports that he quit smoking about 27 years ago. His smoking use included cigarettes. He started smoking about 46 years ago. He has a 4.8 pack-year smoking history. He has never used smokeless tobacco. He reports current alcohol use. He reports that he does not use drugs.The patient is alone today.  Allergies: No Known Allergies  Current Medications: Current Outpatient Medications  Medication Sig Dispense Refill   octreotide (SANDOSTATIN LAR) 20 MG injection Inject 20 mg into the muscle every 28 (twenty-eight) days.     tamsulosin (FLOMAX) 0.4 MG CAPS capsule SMARTSIG:1 Tablet(s) By Mouth Every Evening     No current facility-administered medications for this visit.     I,Jasmine M Lassiter,acting as a scribe for Glenn Beckwith, MD.,have documented all relevant documentation on the behalf of Glenn Beckwith, MD,as directed by  Glenn Beckwith, MD while in the presence of Glenn Beckwith, MD.

## 2023-02-07 ENCOUNTER — Inpatient Hospital Stay: Payer: 59

## 2023-02-07 VITALS — BP 135/81 | HR 69 | Temp 97.9°F | Resp 18 | Ht 70.0 in | Wt 251.0 lb

## 2023-02-07 DIAGNOSIS — C7A00 Malignant carcinoid tumor of unspecified site: Secondary | ICD-10-CM

## 2023-02-07 DIAGNOSIS — C7B8 Other secondary neuroendocrine tumors: Secondary | ICD-10-CM

## 2023-02-07 DIAGNOSIS — C7B02 Secondary carcinoid tumors of liver: Secondary | ICD-10-CM | POA: Diagnosis present

## 2023-02-07 MED ORDER — OCTREOTIDE ACETATE 30 MG IM KIT
30.0000 mg | PACK | Freq: Once | INTRAMUSCULAR | Status: AC
  Administered 2023-02-07: 30 mg via INTRAMUSCULAR
  Filled 2023-02-07: qty 1

## 2023-02-07 NOTE — Patient Instructions (Signed)
Octreotide Injection Solution What is this medication? OCTREOTIDE (ok TREE oh tide) treats high levels of growth hormone (acromegaly). It works by reducing the amount of growth hormone your body makes. This reduces symptoms and the risk of health problems caused by too much growth hormone, such as diabetes and heart disease. It may also be used to treat diarrhea caused by neuroendocrine tumors. It works by slowing down the release of serotonin from the tumor cells. This reduces the number of bowel movements you have. This medicine may be used for other purposes; ask your health care provider or pharmacist if you have questions. COMMON BRAND NAME(S): Bynfezia, Sandostatin What should I tell my care team before I take this medication? They need to know if you have any of these conditions: Diabetes Gallbladder disease Kidney disease Liver disease Thyroid disease An unusual or allergic reaction to octreotide, other medications, foods, dyes, or preservatives Pregnant or trying to get pregnant Breast-feeding How should I use this medication? This medication is injected under the skin or into a vein. It is usually given by your care team in a hospital or clinic setting. If you get this medication at home, you will be taught how to prepare and give it. Use exactly as directed. Take it as directed on the prescription label at the same time every day. Keep taking it unless your care team tells you to stop. Allow the injection solution to come to room temperature before use. Do not warm it artificially. It is important that you put your used needles and syringes in a special sharps container. Do not put them in a trash can. If you do not have a sharps container, call your pharmacist or care team to get one. Talk to your care team about the use of this medication in children. Special care may be needed. Overdosage: If you think you have taken too much of this medicine contact a poison control center or  emergency room at once. NOTE: This medicine is only for you. Do not share this medicine with others. What if I miss a dose? If you miss a dose, take it as soon as you can. If it is almost time for your next dose, take only that dose. Do not take double or extra doses. What may interact with this medication? Bromocriptine Certain medications for blood pressure, heart disease, irregular heartbeat Cyclosporine Diuretics Medications for diabetes, including insulin Quinidine This list may not describe all possible interactions. Give your health care provider a list of all the medicines, herbs, non-prescription drugs, or dietary supplements you use. Also tell them if you smoke, drink alcohol, or use illegal drugs. Some items may interact with your medicine. What should I watch for while using this medication? Visit your care team for regular checks on your progress. Tell your care team if your symptoms do not start to get better or if they get worse. To help reduce irritation at the injection site, use a different site for each injection and make sure the solution is at room temperature before use. This medication may cause decreases in blood sugar. Signs of low blood sugar include chills, cool, pale skin or cold sweats, drowsiness, extreme hunger, fast heartbeat, headache, nausea, nervousness or anxiety, shakiness, trembling, unsteadiness, tiredness, or weakness. Contact your care team right away if you experience any of these symptoms. This medication may increase blood sugar. The risk may be higher in patients who already have diabetes. Ask your care team what you can do to lower your   risk of diabetes while taking this medication. You should make sure you get enough vitamin B12 while you are taking this medication. Discuss the foods you eat and the vitamins you take with your care team. What side effects may I notice from receiving this medication? Side effects that you should report to your care  team as soon as possible: Allergic reactions--skin rash, itching, hives, swelling of the face, lips, tongue, or throat Gallbladder problems--severe stomach pain, nausea, vomiting, fever Heart rhythm changes--fast or irregular heartbeat, dizziness, feeling faint or lightheaded, chest pain, trouble breathing High blood sugar (hyperglycemia)--increased thirst or amount of urine, unusual weakness or fatigue, blurry vision Low blood sugar (hypoglycemia)--tremors or shaking, anxiety, sweating, cold or clammy skin, confusion, dizziness, rapid heartbeat Low thyroid levels (hypothyroidism)--unusual weakness or fatigue, increased sensitivity to cold, constipation, hair loss, dry skin, weight gain, feelings of depression Low vitamin B12 level--pain, tingling, or numbness in the hands or feet, muscle weakness, dizziness, confusion, trouble concentrating Pancreatitis--severe stomach pain that spreads to your back or gets worse after eating or when touched, fever, nausea, vomiting Side effects that usually do not require medical attention (report to your care team if they continue or are bothersome): Diarrhea Dizziness Gas Headache Pain, redness, or irritation at injection site Stomach pain This list may not describe all possible side effects. Call your doctor for medical advice about side effects. You may report side effects to FDA at 1-800-FDA-1088. Where should I keep my medication? Keep out of the reach of children and pets. Store in the refrigerator. Protect from light. Allow to come to room temperature naturally. Do not use artificial heat. If protected from light, the injection may be stored between 20 and 30 degrees C (70 and 86 degrees F) for 14 days. After the initial use, throw away any unused portion of a multiple dose vial after 14 days. Get rid of any unused portions of the ampules after use. To get rid of medications that are no longer needed or have expired: Take the medication to a medication  take-back program. Ask your pharmacy or law enforcement to find a location. If you cannot return the medication, ask your pharmacist or care team how to get rid of the medication safely. NOTE: This sheet is a summary. It may not cover all possible information. If you have questions about this medicine, talk to your doctor, pharmacist, or health care provider.  2024 [employer] Standard (2021-07-19 00:00:00)  

## 2023-02-10 LAB — CHROMOGRANIN A: Chromogranin A: 166.7

## 2023-02-20 ENCOUNTER — Encounter: Payer: Self-pay | Admitting: Oncology

## 2023-02-27 ENCOUNTER — Encounter: Payer: Self-pay | Admitting: Hematology and Oncology

## 2023-03-07 ENCOUNTER — Ambulatory Visit: Payer: 59 | Admitting: Oncology

## 2023-03-07 ENCOUNTER — Inpatient Hospital Stay: Payer: 59

## 2023-03-07 ENCOUNTER — Inpatient Hospital Stay: Payer: 59 | Attending: Oncology

## 2023-03-07 DIAGNOSIS — C7A00 Malignant carcinoid tumor of unspecified site: Secondary | ICD-10-CM | POA: Insufficient documentation

## 2023-03-07 DIAGNOSIS — C7B02 Secondary carcinoid tumors of liver: Secondary | ICD-10-CM | POA: Insufficient documentation

## 2023-03-18 ENCOUNTER — Encounter: Payer: Self-pay | Admitting: Hematology and Oncology

## 2023-03-24 ENCOUNTER — Inpatient Hospital Stay: Payer: 59

## 2023-03-24 ENCOUNTER — Encounter: Payer: Self-pay | Admitting: Hematology and Oncology

## 2023-03-24 ENCOUNTER — Telehealth: Payer: Self-pay | Admitting: Hematology and Oncology

## 2023-03-24 ENCOUNTER — Other Ambulatory Visit: Payer: Self-pay | Admitting: Hematology and Oncology

## 2023-03-24 ENCOUNTER — Inpatient Hospital Stay (HOSPITAL_BASED_OUTPATIENT_CLINIC_OR_DEPARTMENT_OTHER): Payer: 59 | Admitting: Hematology and Oncology

## 2023-03-24 VITALS — BP 143/74 | HR 76 | Temp 98.6°F | Resp 18 | Ht 70.0 in | Wt 254.3 lb

## 2023-03-24 DIAGNOSIS — C7A00 Malignant carcinoid tumor of unspecified site: Secondary | ICD-10-CM

## 2023-03-24 DIAGNOSIS — D696 Thrombocytopenia, unspecified: Secondary | ICD-10-CM | POA: Diagnosis not present

## 2023-03-24 DIAGNOSIS — C7B02 Secondary carcinoid tumors of liver: Secondary | ICD-10-CM | POA: Diagnosis present

## 2023-03-24 DIAGNOSIS — C7B8 Other secondary neuroendocrine tumors: Secondary | ICD-10-CM

## 2023-03-24 HISTORY — DX: Thrombocytopenia, unspecified: D69.6

## 2023-03-24 LAB — CBC WITH DIFFERENTIAL (CANCER CENTER ONLY)
Abs Immature Granulocytes: 0.04 10*3/uL (ref 0.00–0.07)
Basophils Absolute: 0.1 10*3/uL (ref 0.0–0.1)
Basophils Relative: 1 %
Eosinophils Absolute: 0.2 10*3/uL (ref 0.0–0.5)
Eosinophils Relative: 2 %
HCT: 45.1 % (ref 39.0–52.0)
Hemoglobin: 16.1 g/dL (ref 13.0–17.0)
Immature Granulocytes: 1 %
Lymphocytes Relative: 16 %
Lymphs Abs: 1.2 10*3/uL (ref 0.7–4.0)
MCH: 30.8 pg (ref 26.0–34.0)
MCHC: 35.7 g/dL (ref 30.0–36.0)
MCV: 86.2 fL (ref 80.0–100.0)
Monocytes Absolute: 0.5 10*3/uL (ref 0.1–1.0)
Monocytes Relative: 6 %
Neutro Abs: 5.4 10*3/uL (ref 1.7–7.7)
Neutrophils Relative %: 74 %
Platelet Count: 160 10*3/uL (ref 150–400)
RBC: 5.23 MIL/uL (ref 4.22–5.81)
RDW: 13.6 % (ref 11.5–15.5)
WBC Count: 7.4 10*3/uL (ref 4.0–10.5)
nRBC: 0 % (ref 0.0–0.2)
nRBC: 0 /100{WBCs}

## 2023-03-24 LAB — CMP (CANCER CENTER ONLY)
ALT: 47 U/L — ABNORMAL HIGH (ref 0–44)
AST: 34 U/L (ref 15–41)
Albumin: 4.6 g/dL (ref 3.5–5.0)
Alkaline Phosphatase: 61 U/L (ref 38–126)
Anion gap: 12 (ref 5–15)
BUN: 18 mg/dL (ref 8–23)
CO2: 24 mmol/L (ref 22–32)
Calcium: 9.6 mg/dL (ref 8.9–10.3)
Chloride: 106 mmol/L (ref 98–111)
Creatinine: 1.78 mg/dL — ABNORMAL HIGH (ref 0.61–1.24)
GFR, Estimated: 42 mL/min — ABNORMAL LOW (ref >60)
Glucose, Bld: 125 mg/dL — ABNORMAL HIGH (ref 70–99)
Potassium: 4.2 mmol/L (ref 3.5–5.1)
Sodium: 142 mmol/L (ref 135–145)
Total Bilirubin: 0.5 mg/dL (ref <1.2)
Total Protein: 6.7 g/dL (ref 6.5–8.1)

## 2023-03-24 LAB — TECHNOLOGIST SMEAR REVIEW: Plt Morphology: NORMAL

## 2023-03-24 LAB — VITAMIN B12: Vitamin B-12: 95 pg/mL — ABNORMAL LOW (ref 180–914)

## 2023-03-24 LAB — FOLATE: Folate: 11.2 ng/mL (ref >5.9)

## 2023-03-24 MED ORDER — OCTREOTIDE ACETATE 30 MG IM KIT
30.0000 mg | PACK | Freq: Once | INTRAMUSCULAR | Status: AC
  Administered 2023-03-24: 30 mg via INTRAMUSCULAR
  Filled 2023-03-24: qty 1

## 2023-03-24 NOTE — Assessment & Plan Note (Signed)
Mild thrombocytopenia. Will add B12 and folate today.

## 2023-03-24 NOTE — Patient Instructions (Signed)
Octreotide Injection Suspension What is this medication? OCTREOTIDE (ok TREE oh tide) treats high levels of growth hormone (acromegaly). It works by reducing the amount of growth hormone your body makes. This reduces symptoms and the risk of health problems caused by too much growth hormone, such as diabetes and heart disease. It may also be used to treat diarrhea caused by neuroendocrine tumors. It works by slowing down the release of serotonin from the tumor cells. This reduces the number of bowel movements you have. This medicine may be used for other purposes; ask your health care provider or pharmacist if you have questions. COMMON BRAND NAME(S): Sandostatin LAR What should I tell my care team before I take this medication? They need to know if you have any of these conditions: Diabetes Gallbladder disease Kidney disease Liver disease Thyroid disease An unusual or allergic reaction to octreotide, other medications, foods, dyes, or preservatives Pregnant or trying to get pregnant Breast-feeding How should I use this medication? This medication is injected into a muscle. It is usually given by your care team in a hospital or clinic setting. Talk to your care team about the use of this medication in children. Special care may be needed. Overdosage: If you think you have taken too much of this medicine contact a poison control center or emergency room at once. NOTE: This medicine is only for you. Do not share this medicine with others. What if I miss a dose? Keep appointments for follow-up doses. It is important not to miss your dose. Call your care team if you are unable to keep an appointment. What may interact with this medication? Do not take this medication with any of the following: Cisapride Dronedarone Flibanserin Lutetium Lu 177 dotatate Pimozide Saquinavir Thioridazine This medication may also interact with the following: Bromocriptine Certain medications for blood  pressure, heart disease, irregular heartbeat Cyclosporine Diuretics Medications for diabetes, including insulin Quinidine This list may not describe all possible interactions. Give your health care provider a list of all the medicines, herbs, non-prescription drugs, or dietary supplements you use. Also tell them if you smoke, drink alcohol, or use illegal drugs. Some items may interact with your medicine. What should I watch for while using this medication? Visit your care team for regular checks on your progress. Tell your care team if your symptoms do not start to get better or if they get worse. This medication may cause decreases in blood sugar. Signs of low blood sugar include chills, cool, pale skin or cold sweats, drowsiness, extreme hunger, fast heartbeat, headache, nausea, nervousness or anxiety, shakiness, trembling, unsteadiness, tiredness, or weakness. Contact your care team right away if you experience any of these symptoms. This medication may increase blood sugar. The risk may be higher in patients who already have diabetes. Ask your care team what you can do to lower your risk of diabetes while taking this medication. You should make sure you get enough vitamin B12 while you are taking this medication. Discuss the foods you eat and the vitamins you take with your care team. What side effects may I notice from receiving this medication? Side effects that you should report to your care team as soon as possible: Allergic reactions--skin rash, itching, hives, swelling of the face, lips, tongue, or throat Gallbladder problems--severe stomach pain, nausea, vomiting, fever Heart rhythm changes--fast or irregular heartbeat, dizziness, feeling faint or lightheaded, chest pain, trouble breathing High blood sugar (hyperglycemia)--increased thirst or amount of urine, unusual weakness or fatigue, blurry vision Low blood  sugar (hypoglycemia)--tremors or shaking, anxiety, sweating, cold or clammy  skin, confusion, dizziness, rapid heartbeat Low thyroid levels (hypothyroidism)--unusual weakness or fatigue, increased sensitivity to cold, constipation, hair loss, dry skin, weight gain, feelings of depression Low vitamin B12 level--pain, tingling, or numbness in the hands or feet, muscle weakness, dizziness, confusion, trouble concentrating Pancreatitis--severe stomach pain that spreads to your back or gets worse after eating or when touched, fever, nausea, vomiting Side effects that usually do not require medical attention (report to your care team if they continue or are bothersome): Diarrhea Dizziness Gas Headache Pain, redness, or irritation at injection site Stomach pain This list may not describe all possible side effects. Call your doctor for medical advice about side effects. You may report side effects to FDA at 1-800-FDA-1088. Where should I keep my medication? This medication is given in a hospital or clinic. It will not be stored at home. NOTE: This sheet is a summary. It may not cover all possible information. If you have questions about this medicine, talk to your doctor, pharmacist, or health care provider.  2024 Elsevier/Gold Standard (2021-07-19 00:00:00)

## 2023-03-24 NOTE — Progress Notes (Signed)
[facility]  [address] [place],  [place]  [address] [phone]  Clinic Day:  03/24/2023  Referring physician: Mikki Santee Key, *  ASSESSMENT & PLAN:   Assessment & Plan: Malignant carcinoid tumor of unknown primary site [facility]) Metastatic carcinoid tumor consistent with gastrointestinal origin in September 2021.  He does have a terminal ileum mass measuring 4.0 x 2.8 x 3.7 cm, and a mass of the mesentery of the small intestine measuring 3.9 x 2.3 x 4.0 cm.  As this has already metastasized to the liver, surgical resection was not indicated.  24 hour urine for 5 HIAA was quite elevated at 86.8 consistent with carcinoid syndrome.  Chromogranin A was also elevated at 214. He started treatment with octreotide injections every 4 weeks in October 2021.  Due to the persistent diarrhea, we increased the octreotide to 30 mg every 4 weeks. CT imaging in August 2022 was stable.  The chromogranin A from November 2022 was down to 118.6, but has been increasing this year.  CT scan in April 2023 remained stable.  The chromogranin A was 152 in July.  The patient was hospitalized in September. CT abdomen and pelvis at that time revealed small-bowel obstruction, with transition point in the mid to distal jejunum adjacent to the known mesenteric carcinoid tumor. Stable, 3.9 cm, calcified central mesenteric mass, consistent with carcinoid tumor. Numerous liver masses, consistent with metastatic carcinoid tumor. Index lesions not appreciably changed since prior exam.   The chromogranin A was as low as 125 in March 2024, but increased to 142 in June.  CT abdomen and pelvis in April did not reveal any progression of disease.  Chromogranin A was stable at 146 in August.  He had further elevation of the chromogranin A in October to 166.  CT abdomen pelvis and October revealed Stable calcified mesenteric mass and shotty sub-centimeter mesenteric lymph nodes, consistent with carcinoid tumor.  Diffuse hepatic metastases and hepatic steatosis, without significant change. No new or progressive metastatic disease within the abdomen or pelvis. Left nephrolithiasis. No evidence of ureteral calculi or hydronephrosis. Small left inguinal hernia, which contained only fat.   He will proceed with octreotide today and continue octreotide every 4 weeks.  We will plan to see him back in 12 weeks with a CBC, comprehensive metabolic panel and chromogranin A for repeat clinical assessment.   Thrombocytopenia (HCC) Mild thrombocytopenia. Will add B12 and folate today.  Secondary neuroendocrine tumor of liver (HCC) Multiple liver masses, the largest measuring 4.0 x 4.3 cm, consistent with metastatic carcinoid with carcinoid syndrome, which were stable on CT imaging in October 2024.    The patient understands the plans discussed today and is in agreement with them.  He knows to contact our office if he develops concerns prior to his next appointment.   I provided 30 minutes of face-to-face time during this encounter and > 50% was spent counseling as documented under my assessment and plan.    Adah Perl, PA-C  [facility] - A DEPT OF [facility] [address] [place] [address] Dept: [phone] Dept Fax: [phone]   Orders Placed This Encounter  Procedures   CBC with Differential (Cancer Center Only)    Standing Status:   Future    Standing Expiration Date:   03/23/2024   CMP (Cancer Center only)    Standing Status:   Future    Standing Expiration Date:   03/23/2024  Chromogranin A    Standing Status:   Future    Standing Expiration Date:   03/23/2024      CHIEF COMPLAINT:  CC: Metastatic carcinoid  Current Treatment: Octreotide every 4 weeks  HISTORY OF PRESENT ILLNESS:   Oncology History  Malignant carcinoid tumor of unknown primary site [facility])  02/11/2020 Initial Diagnosis   Malignant carcinoid tumor of  unknown primary site [facility])       INTERVAL HISTORY:  Glenn Bender is here today for repeat clinical assessment prior to octreotide. He states he has been doing well. He denies diarrhea or flushing.  He denies easy bruising or abnormal bleeding.  He denies fevers or chills. He denies pain. His appetite is good. His weight has increased 3 pounds over last 1 month .  REVIEW OF SYSTEMS:  Review of Systems  Constitutional:  Negative for appetite change, chills, fatigue, fever and unexpected weight change.  HENT:   Negative for lump/mass, mouth sores and sore throat.   Respiratory:  Negative for cough and shortness of breath.   Cardiovascular:  Negative for chest pain and leg swelling.  Gastrointestinal:  Negative for abdominal pain, constipation, diarrhea, nausea and vomiting.  Genitourinary:  Negative for difficulty urinating, dysuria, frequency and hematuria.   Musculoskeletal:  Negative for arthralgias, back pain and myalgias.  Skin:  Negative for itching, rash and wound.  Neurological:  Negative for dizziness, extremity weakness, headaches, light-headedness and numbness.  Hematological:  Negative for adenopathy. Does not bruise/bleed easily.  Psychiatric/Behavioral:  Negative for depression and sleep disturbance. The patient is not nervous/anxious.      VITALS:  Blood pressure (!) 143/74, pulse 76, temperature 98.6 F (37 C), temperature source Oral, resp. rate 18, height 5\' 10"  (1.778 m), weight 254 lb 4.8 oz (115.3 kg), SpO2 100%.  Wt Readings from Last 3 Encounters:  03/24/23 254 lb 4.8 oz (115.3 kg)  02/07/23 251 lb 0.6 oz (113.9 kg)  02/05/23 251 lb 1.6 oz (113.9 kg)    Body mass index is 36.49 kg/m.  Performance status (ECOG): 0 - Asymptomatic  PHYSICAL EXAM:  Physical Exam Vitals and nursing note reviewed.  Constitutional:      General: He is not in acute distress.    Appearance: Normal appearance. He is normal weight. He is not ill-appearing.  HENT:     Head: Normocephalic  and atraumatic.     Mouth/Throat:     Mouth: Mucous membranes are moist.     Pharynx: Oropharynx is clear. No oropharyngeal exudate or posterior oropharyngeal erythema.  Eyes:     General: No scleral icterus.    Extraocular Movements: Extraocular movements intact.     Conjunctiva/sclera: Conjunctivae normal.     Pupils: Pupils are equal, round, and reactive to light.  Cardiovascular:     Rate and Rhythm: Normal rate and regular rhythm.     Heart sounds: Normal heart sounds. No murmur heard.    No friction rub. No gallop.  Pulmonary:     Effort: Pulmonary effort is normal.     Breath sounds: Normal breath sounds. No wheezing, rhonchi or rales.  Abdominal:     General: Bowel sounds are normal. There is no distension.     Palpations: Abdomen is soft. There is no hepatomegaly, splenomegaly or mass.     Tenderness: There is no abdominal tenderness. There is no right CVA tenderness or left CVA tenderness.  Musculoskeletal:        General: Normal range of motion.     Cervical back:  Normal range of motion and neck supple. No tenderness.     Right lower leg: No edema.     Left lower leg: No edema.  Lymphadenopathy:     Cervical: No cervical adenopathy.     Upper Body:     Right upper body: No supraclavicular or axillary adenopathy.     Left upper body: No supraclavicular or axillary adenopathy.     Lower Body: No right inguinal adenopathy. No left inguinal adenopathy.  Skin:    General: Skin is warm and dry.     Coloration: Skin is not jaundiced.     Findings: No rash.  Neurological:     Mental Status: He is alert and oriented to person, place, and time.     Cranial Nerves: No cranial nerve deficit.  Psychiatric:        Mood and Affect: Mood normal.        Behavior: Behavior normal.        Thought Content: Thought content normal.     LABS:      Latest Ref Rng & Units 03/24/2023    9:26 AM 02/03/2023   12:00 AM 12/10/2022    2:18 PM  CBC  WBC 4.0 - 10.5 K/uL 7.4  6.5     5.8    Hemoglobin 13.0 - 17.0 g/dL 60.4  54.0     98.1   Hematocrit 39.0 - 52.0 % 45.1  43     42.5   Platelets 150 - 400 K/uL 160  126     133      This result is from an external source.      Latest Ref Rng & Units 03/24/2023    9:26 AM 02/03/2023   12:00 AM 12/10/2022    2:18 PM  CMP  Glucose 70 - 99 mg/dL 191   478   BUN 8 - 23 mg/dL 18  20     21    Creatinine 0.61 - 1.24 mg/dL 2.95  1.6     6.21   Sodium 135 - 145 mmol/L 142  136     138   Potassium 3.5 - 5.1 mmol/L 4.2  4.2     4.2   Chloride 98 - 111 mmol/L 106  110     108   CO2 22 - 32 mmol/L 24  21     21    Calcium 8.9 - 10.3 mg/dL 9.6  9.0     8.4   Total Protein 6.5 - 8.1 g/dL 6.7   6.1   Total Bilirubin <1.2 mg/dL 0.5   0.7   Alkaline Phos 38 - 126 U/L 61  45     42   AST 15 - 41 U/L 34  38     29   ALT 0 - 44 U/L 47  47     34      This result is from an external source.      STUDIES:  No results found.    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)    Malignant nonargentaffin carcinoid tumor (HCC)    Malignant nonargentaffin carcinoid tumor (HCC)    Secondary malignant carcinoid tumor of liver (HCC)    Thrombocytopenia (HCC) 03/24/2023  Past Surgical History:  Procedure Laterality Date   CYSTOTOMY  01/2020   IR URETERAL STENT LEFT NEW ACCESS W/O SEP NEPHROSTOMY CATH  06/13/2020   IR URETERAL STENT PLACEMENT EXISTING ACCESS LEFT  06/15/2020   LYMPH NODE BIOPSY Left    supraclavicular   NEPHROLITHOTOMY Left 06/13/2020   Procedure: NEPHROLITHOTOMY PERCUTANEOUS;  Surgeon: Noel Christmas, MD;  Location: [facility];  Service: Urology;  Laterality: Left;  2 HRS   ORCHIECTOMY  1996   UMBILICAL HERNIA REPAIR      Family History  Problem Relation Age of Onset   Cancer Mother        jaw bone   Throat cancer Father         smoker    Social History:  reports that he quit smoking about 27 years ago. His smoking use included cigarettes. He started smoking about 46 years ago. He has a 4.8 pack-year smoking history. He has never used smokeless tobacco. He reports current alcohol use. He reports that he does not use drugs.The patient is alone  today.  Allergies: No Known Allergies  Current Medications: Current Outpatient Medications  Medication Sig Dispense Refill   octreotide (SANDOSTATIN LAR) 20 MG injection Inject 20 mg into the muscle every 28 (twenty-eight) days.     tamsulosin (FLOMAX) 0.4 MG CAPS capsule SMARTSIG:1 Tablet(s) By Mouth Every Evening     No current facility-administered medications for this visit.

## 2023-03-24 NOTE — Telephone Encounter (Signed)
Patient has been scheduled for follow-up visit per 03/24/23 LOS.  Pt given an appt calendar with date and time.

## 2023-03-24 NOTE — Assessment & Plan Note (Addendum)
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  He does have a terminal ileum mass measuring 4.0 x 2.8 x 3.7 cm, and a mass of the mesentery of the small intestine measuring 3.9 x 2.3 x 4.0 cm.  As this has already metastasized to the liver, surgical resection was not indicated.  24 hour urine for 5 HIAA was quite elevated at 86.8 consistent with carcinoid syndrome.  Chromogranin A was also elevated at 214. He started treatment with octreotide injections every 4 weeks in October 2021.  Due to the persistent diarrhea, we increased the octreotide to 30 mg every 4 weeks. CT imaging in August 2022 was stable.  The chromogranin A from November 2022 was down to 118.6, but has been increasing this year.  CT scan in April 2023 remained stable.  The chromogranin A was 152 in July.  The patient was hospitalized in September. CT abdomen and pelvis at that time revealed small-bowel obstruction, with transition point in the mid to distal jejunum adjacent to the known mesenteric carcinoid tumor. Stable, 3.9 cm, calcified central mesenteric mass, consistent with carcinoid tumor. Numerous liver masses, consistent with metastatic carcinoid tumor. Index lesions not appreciably changed since prior exam.   The chromogranin A was as low as 125 in March 2024, but increased to 142 in June.  CT abdomen and pelvis in April did not reveal any progression of disease.  Chromogranin A was stable at 146 in August.  He had further elevation of the chromogranin A in October to 166.  CT abdomen pelvis and October revealed Stable calcified mesenteric mass and shotty sub-centimeter mesenteric lymph nodes, consistent with carcinoid tumor. Diffuse hepatic metastases and hepatic steatosis, without significant change. No new or progressive metastatic disease within the abdomen or pelvis. Left nephrolithiasis. No evidence of ureteral calculi or hydronephrosis. Small left inguinal hernia, which contained only fat.    He will proceed with octreotide today and continue octreotide every 4 weeks.  We will plan to see him back in 12 weeks with a CBC, comprehensive metabolic panel and chromogranin A for repeat clinical assessment.

## 2023-03-24 NOTE — Assessment & Plan Note (Signed)
Multiple liver masses, the largest measuring 4.0 x 4.3 cm, consistent with metastatic carcinoid with carcinoid syndrome, which were stable on CT imaging in October 2024.

## 2023-03-25 ENCOUNTER — Other Ambulatory Visit: Payer: Self-pay | Admitting: Hematology and Oncology

## 2023-03-27 ENCOUNTER — Other Ambulatory Visit: Payer: Self-pay | Admitting: Hematology and Oncology

## 2023-03-27 DIAGNOSIS — E538 Deficiency of other specified B group vitamins: Secondary | ICD-10-CM

## 2023-03-31 ENCOUNTER — Inpatient Hospital Stay: Payer: 59 | Attending: Oncology

## 2023-03-31 VITALS — BP 135/78 | HR 62 | Resp 18

## 2023-03-31 DIAGNOSIS — Z23 Encounter for immunization: Secondary | ICD-10-CM | POA: Diagnosis not present

## 2023-03-31 DIAGNOSIS — C7B02 Secondary carcinoid tumors of liver: Secondary | ICD-10-CM | POA: Diagnosis present

## 2023-03-31 DIAGNOSIS — C7A00 Malignant carcinoid tumor of unspecified site: Secondary | ICD-10-CM | POA: Diagnosis present

## 2023-03-31 DIAGNOSIS — D696 Thrombocytopenia, unspecified: Secondary | ICD-10-CM | POA: Insufficient documentation

## 2023-03-31 DIAGNOSIS — C7B8 Other secondary neuroendocrine tumors: Secondary | ICD-10-CM

## 2023-03-31 MED ORDER — INFLUENZA VIRUS VACC SPLIT PF (FLUZONE) 0.5 ML IM SUSY
0.5000 mL | PREFILLED_SYRINGE | Freq: Once | INTRAMUSCULAR | Status: AC
  Administered 2023-03-31: 0.5 mL via INTRAMUSCULAR
  Filled 2023-03-31: qty 0.5

## 2023-03-31 MED ORDER — CYANOCOBALAMIN 1000 MCG/ML IJ SOLN
1000.0000 ug | Freq: Once | INTRAMUSCULAR | Status: AC
  Administered 2023-03-31: 1000 ug via INTRAMUSCULAR
  Filled 2023-03-31: qty 1

## 2023-03-31 NOTE — Patient Instructions (Signed)
 Vitamin B12 Injection What is this medication? Vitamin B12 (VAHY tuh min B12) prevents and treats low vitamin B12 levels in your body. It is used in people who do not get enough vitamin B12 from their diet or when their digestive tract does not absorb enough. Vitamin B12 plays an important role in maintaining the health of your nervous system and red blood cells. This medicine may be used for other purposes; ask your health care provider or pharmacist if you have questions. COMMON BRAND NAME(S): B-12 Compliance Kit, B-12 Injection Kit, Cyomin, Dodex, LA-12, Nutri-Twelve, Physicians EZ Use B-12, Primabalt, Vitamin Deficiency Injectable System - B12 What should I tell my care team before I take this medication? They need to know if you have any of these conditions: Kidney disease Leber's disease Megaloblastic anemia An unusual or allergic reaction to cyanocobalamin, cobalt, other medications, foods, dyes, or preservatives Pregnant or trying to get pregnant Breast-feeding How should I use this medication? This medication is injected into a muscle or deeply under the skin. It is usually given in a clinic or care team's office. However, your care team may teach you how to inject yourself. Follow all instructions. Talk to your care team about the use of this medication in children. Special care may be needed. Overdosage: If you think you have taken too much of this medicine contact a poison control center or emergency room at once. NOTE: This medicine is only for you. Do not share this medicine with others. What if I miss a dose? If you are given your dose at a clinic or care team's office, call to reschedule your appointment. If you give your own injections, and you miss a dose, take it as soon as you can. If it is almost time for your next dose, take only that dose. Do not take double or extra doses. What may interact with this medication? Alcohol Colchicine This list may not describe all possible  interactions. Give your health care provider a list of all the medicines, herbs, non-prescription drugs, or dietary supplements you use. Also tell them if you smoke, drink alcohol, or use illegal drugs. Some items may interact with your medicine. What should I watch for while using this medication? Visit your care team regularly. You may need blood work done while you are taking this medication. You may need to follow a special diet. Talk to your care team. Limit your alcohol intake and avoid smoking to get the best benefit. What side effects may I notice from receiving this medication? Side effects that you should report to your care team as soon as possible: Allergic reactions--skin rash, itching, hives, swelling of the face, lips, tongue, or throat Swelling of the ankles, hands, or feet Trouble breathing Side effects that usually do not require medical attention (report to your care team if they continue or are bothersome): Diarrhea This list may not describe all possible side effects. Call your doctor for medical advice about side effects. You may report side effects to FDA at 1-800-FDA-1088. Where should I keep my medication? Keep out of the reach of children. Store at room temperature between 15 and 30 degrees C (59 and 85 degrees F). Protect from light. Throw away any unused medication after the expiration date. NOTE: This sheet is a summary. It may not cover all possible information. If you have questions about this medicine, talk to your doctor, pharmacist, or health care provider.  2024 Elsevier/Gold Standard (2020-12-26 00:00:00)

## 2023-04-07 ENCOUNTER — Inpatient Hospital Stay: Payer: 59

## 2023-04-07 VITALS — BP 139/85 | HR 61 | Temp 98.0°F | Resp 18 | Ht 70.0 in | Wt 254.0 lb

## 2023-04-07 DIAGNOSIS — C7B8 Other secondary neuroendocrine tumors: Secondary | ICD-10-CM

## 2023-04-07 DIAGNOSIS — C7A00 Malignant carcinoid tumor of unspecified site: Secondary | ICD-10-CM

## 2023-04-07 MED ORDER — CYANOCOBALAMIN 1000 MCG/ML IJ SOLN
1000.0000 ug | Freq: Once | INTRAMUSCULAR | Status: AC
Start: 2023-04-07 — End: 2023-04-07
  Administered 2023-04-07: 1000 ug via INTRAMUSCULAR
  Filled 2023-04-07: qty 1

## 2023-04-07 NOTE — Patient Instructions (Signed)
 Vitamin B12 Injection What is this medication? Vitamin B12 (VAHY tuh min B12) prevents and treats low vitamin B12 levels in your body. It is used in people who do not get enough vitamin B12 from their diet or when their digestive tract does not absorb enough. Vitamin B12 plays an important role in maintaining the health of your nervous system and red blood cells. This medicine may be used for other purposes; ask your health care provider or pharmacist if you have questions. COMMON BRAND NAME(S): B-12 Compliance Kit, B-12 Injection Kit, Cyomin, Dodex, LA-12, Nutri-Twelve, Physicians EZ Use B-12, Primabalt, Vitamin Deficiency Injectable System - B12 What should I tell my care team before I take this medication? They need to know if you have any of these conditions: Kidney disease Leber's disease Megaloblastic anemia An unusual or allergic reaction to cyanocobalamin, cobalt, other medications, foods, dyes, or preservatives Pregnant or trying to get pregnant Breast-feeding How should I use this medication? This medication is injected into a muscle or deeply under the skin. It is usually given in a clinic or care team's office. However, your care team may teach you how to inject yourself. Follow all instructions. Talk to your care team about the use of this medication in children. Special care may be needed. Overdosage: If you think you have taken too much of this medicine contact a poison control center or emergency room at once. NOTE: This medicine is only for you. Do not share this medicine with others. What if I miss a dose? If you are given your dose at a clinic or care team's office, call to reschedule your appointment. If you give your own injections, and you miss a dose, take it as soon as you can. If it is almost time for your next dose, take only that dose. Do not take double or extra doses. What may interact with this medication? Alcohol Colchicine This list may not describe all possible  interactions. Give your health care provider a list of all the medicines, herbs, non-prescription drugs, or dietary supplements you use. Also tell them if you smoke, drink alcohol, or use illegal drugs. Some items may interact with your medicine. What should I watch for while using this medication? Visit your care team regularly. You may need blood work done while you are taking this medication. You may need to follow a special diet. Talk to your care team. Limit your alcohol intake and avoid smoking to get the best benefit. What side effects may I notice from receiving this medication? Side effects that you should report to your care team as soon as possible: Allergic reactions--skin rash, itching, hives, swelling of the face, lips, tongue, or throat Swelling of the ankles, hands, or feet Trouble breathing Side effects that usually do not require medical attention (report to your care team if they continue or are bothersome): Diarrhea This list may not describe all possible side effects. Call your doctor for medical advice about side effects. You may report side effects to FDA at 1-800-FDA-1088. Where should I keep my medication? Keep out of the reach of children. Store at room temperature between 15 and 30 degrees C (59 and 85 degrees F). Protect from light. Throw away any unused medication after the expiration date. NOTE: This sheet is a summary. It may not cover all possible information. If you have questions about this medicine, talk to your doctor, pharmacist, or health care provider.  2024 Elsevier/Gold Standard (2020-12-26 00:00:00)

## 2023-04-14 ENCOUNTER — Ambulatory Visit: Payer: 59

## 2023-04-15 ENCOUNTER — Inpatient Hospital Stay: Payer: 59

## 2023-04-15 VITALS — BP 129/66 | HR 67 | Temp 98.0°F | Resp 18

## 2023-04-15 DIAGNOSIS — C7A00 Malignant carcinoid tumor of unspecified site: Secondary | ICD-10-CM | POA: Diagnosis not present

## 2023-04-15 DIAGNOSIS — C7B8 Other secondary neuroendocrine tumors: Secondary | ICD-10-CM

## 2023-04-15 MED ORDER — CYANOCOBALAMIN 1000 MCG/ML IJ SOLN
1000.0000 ug | Freq: Once | INTRAMUSCULAR | Status: AC
  Administered 2023-04-15: 1000 ug via INTRAMUSCULAR
  Filled 2023-04-15: qty 1

## 2023-04-15 NOTE — Patient Instructions (Signed)
 Vitamin B12 Injection What is this medication? Vitamin B12 (VAHY tuh min B12) prevents and treats low vitamin B12 levels in your body. It is used in people who do not get enough vitamin B12 from their diet or when their digestive tract does not absorb enough. Vitamin B12 plays an important role in maintaining the health of your nervous system and red blood cells. This medicine may be used for other purposes; ask your health care provider or pharmacist if you have questions. COMMON BRAND NAME(S): B-12 Compliance Kit, B-12 Injection Kit, Cyomin, Dodex, LA-12, Nutri-Twelve, Physicians EZ Use B-12, Primabalt, Vitamin Deficiency Injectable System - B12 What should I tell my care team before I take this medication? They need to know if you have any of these conditions: Kidney disease Leber's disease Megaloblastic anemia An unusual or allergic reaction to cyanocobalamin, cobalt, other medications, foods, dyes, or preservatives Pregnant or trying to get pregnant Breast-feeding How should I use this medication? This medication is injected into a muscle or deeply under the skin. It is usually given in a clinic or care team's office. However, your care team may teach you how to inject yourself. Follow all instructions. Talk to your care team about the use of this medication in children. Special care may be needed. Overdosage: If you think you have taken too much of this medicine contact a poison control center or emergency room at once. NOTE: This medicine is only for you. Do not share this medicine with others. What if I miss a dose? If you are given your dose at a clinic or care team's office, call to reschedule your appointment. If you give your own injections, and you miss a dose, take it as soon as you can. If it is almost time for your next dose, take only that dose. Do not take double or extra doses. What may interact with this medication? Alcohol Colchicine This list may not describe all possible  interactions. Give your health care provider a list of all the medicines, herbs, non-prescription drugs, or dietary supplements you use. Also tell them if you smoke, drink alcohol, or use illegal drugs. Some items may interact with your medicine. What should I watch for while using this medication? Visit your care team regularly. You may need blood work done while you are taking this medication. You may need to follow a special diet. Talk to your care team. Limit your alcohol intake and avoid smoking to get the best benefit. What side effects may I notice from receiving this medication? Side effects that you should report to your care team as soon as possible: Allergic reactions--skin rash, itching, hives, swelling of the face, lips, tongue, or throat Swelling of the ankles, hands, or feet Trouble breathing Side effects that usually do not require medical attention (report to your care team if they continue or are bothersome): Diarrhea This list may not describe all possible side effects. Call your doctor for medical advice about side effects. You may report side effects to FDA at 1-800-FDA-1088. Where should I keep my medication? Keep out of the reach of children. Store at room temperature between 15 and 30 degrees C (59 and 85 degrees F). Protect from light. Throw away any unused medication after the expiration date. NOTE: This sheet is a summary. It may not cover all possible information. If you have questions about this medicine, talk to your doctor, pharmacist, or health care provider.  2024 Elsevier/Gold Standard (2020-12-26 00:00:00)

## 2023-04-21 ENCOUNTER — Inpatient Hospital Stay: Payer: 59

## 2023-04-21 VITALS — BP 133/70 | HR 70 | Temp 98.6°F | Resp 18 | Ht 70.0 in | Wt 257.1 lb

## 2023-04-21 DIAGNOSIS — C7B8 Other secondary neuroendocrine tumors: Secondary | ICD-10-CM

## 2023-04-21 DIAGNOSIS — C7A00 Malignant carcinoid tumor of unspecified site: Secondary | ICD-10-CM | POA: Diagnosis not present

## 2023-04-21 MED ORDER — OCTREOTIDE ACETATE 30 MG IM KIT
30.0000 mg | PACK | Freq: Once | INTRAMUSCULAR | Status: AC
  Administered 2023-04-21: 30 mg via INTRAMUSCULAR
  Filled 2023-04-21: qty 1

## 2023-04-21 MED ORDER — CYANOCOBALAMIN 1000 MCG/ML IJ SOLN
1000.0000 ug | Freq: Once | INTRAMUSCULAR | Status: AC
Start: 2023-04-21 — End: 2023-04-21
  Administered 2023-04-21: 1000 ug via INTRAMUSCULAR
  Filled 2023-04-21: qty 1

## 2023-04-21 NOTE — Patient Instructions (Signed)
Vitamin B12 Deficiency Vitamin B12 deficiency occurs when the body does not have enough of this important vitamin. The body needs this vitamin: To make red blood cells. To make DNA. This is the genetic material inside cells. To help the nerves work properly so they can carry messages from the brain to the body. Vitamin B12 deficiency can cause health problems, such as not having enough red blood cells in the blood (anemia). This can lead to nerve damage if untreated. What are the causes? This condition may be caused by: Not eating enough foods that contain vitamin B12. Not having enough stomach acid and digestive fluids to properly absorb vitamin B12 from the food that you eat. Having certain diseases that make it hard to absorb vitamin B12. These diseases include Crohn's disease, chronic pancreatitis, and cystic fibrosis. An autoimmune disorder in which the body does not make enough of a protein (intrinsic factor) within the stomach, resulting in not enough absorption of vitamin B12. Having a surgery in which part of the stomach or small intestine is removed. Taking certain medicines that make it hard for the body to absorb vitamin B12. These include: Heartburn medicines, such as antacids and proton pump inhibitors. Some medicines that are used to treat diabetes. What increases the risk? The following factors may make you more likely to develop a vitamin B12 deficiency: Being an older adult. Eating a vegetarian or vegan diet that does not include any foods that come from animals. Eating a poor diet while you are pregnant. Taking certain medicines. Having alcoholism. What are the signs or symptoms? In some cases, there are no symptoms of this condition. If the condition leads to anemia or nerve damage, various symptoms may occur, such as: Weakness. Tiredness (fatigue). Loss of appetite. Numbness or tingling in your hands and feet. Redness and burning of the tongue. Depression,  confusion, or memory problems. Trouble walking. If anemia is severe, symptoms can include: Shortness of breath. Dizziness. Rapid heart rate. How is this diagnosed? This condition may be diagnosed with a blood test to measure the level of vitamin B12 in your blood. You may also have other tests, including: A group of tests that measure certain characteristics of blood cells (complete blood count, CBC). A blood test to measure intrinsic factor. A procedure where a thin tube with a camera on the end is used to look into your stomach or intestines (endoscopy). Other tests may be needed to discover the cause of the deficiency. How is this treated? Treatment for this condition depends on the cause. This condition may be treated by: Changing your eating and drinking habits, such as: Eating more foods that contain vitamin B12. Drinking less alcohol or no alcohol. Getting vitamin B12 injections. Taking vitamin B12 supplements by mouth (orally). Your health care provider will tell you which dose is best for you. Follow these instructions at home: Eating and drinking  Include foods in your diet that come from animals and contain a lot of vitamin B12. These include: Meats and poultry. This includes beef, pork, chicken, [place], and organ meats, such as liver. Seafood. This includes clams, rainbow trout, salmon, tuna, and haddock. Eggs. Dairy foods such as milk, yogurt, and cheese. Eat foods that have vitamin B12 added to them (are fortified), such as ready-to-eat breakfast cereals. Check the label on the package to see if a food is fortified. The items listed above may not be a complete list of foods and beverages you can eat and drink. Contact a dietitian for  more information. Alcohol use Do not drink alcohol if: Your health care provider tells you not to drink. You are pregnant, may be pregnant, or are planning to become pregnant. If you drink alcohol: Limit how much you have to: 0-1 drink a  day for women. 0-2 drinks a day for men. Know how much alcohol is in your drink. In the [place] one drink equals one 12 oz bottle of beer (355 mL), one 5 oz glass of wine (148 mL), or one 1 oz glass of hard liquor (44 mL). General instructions Get vitamin B12 injections if told to by your health care provider. Take supplements only as told by your health care provider. Follow the directions carefully. Keep all follow-up visits. This is important. Contact a health care provider if: Your symptoms come back. Your symptoms get worse or do not improve with treatment. Get help right away: You develop shortness of breath. You have a rapid heart rate. You have chest pain. You become dizzy or you faint. These symptoms may be an emergency. Get help right away. Call 911. Do not wait to see if the symptoms will go away. Do not drive yourself to the hospital. Summary Vitamin B12 deficiency occurs when the body does not have enough of this important vitamin. Common causes include not eating enough foods that contain vitamin B12, not being able to absorb vitamin B12 from the food that you eat, having a surgery in which part of the stomach or small intestine is removed, or taking certain medicines. Eat foods that have vitamin B12 in them. Treatment may include making a change in the way you eat and drink, getting vitamin B12 injections, or taking vitamin B12 supplements. This information is not intended to replace advice given to you by your health care provider. Make sure you discuss any questions you have with your health care provider. Document Revised: 12/08/2020 Document Reviewed: 12/08/2020 Elsevier Patient Education  2024 [employer] Immune Globulin Injection What is this medication? IMMUNE GLOBULIN (im MUNE  GLOB yoo lin) helps to prevent or reduce the severity of certain infections in patients who are at risk. This medicine is collected from the pooled blood of many donors. It is used to treat  immune system problems, thrombocytopenia, and Kawasaki syndrome. This medicine may be used for other purposes; ask your health care provider or pharmacist if you have questions. This medicine may be used for other purposes; ask your health care provider or pharmacist if you have questions. COMMON BRAND NAME(S): ASCENIV, Baygam, BIVIGAM, Carimune, Carimune NF, cutaquig, Cuvitru, Flebogamma, Flebogamma DIF, GamaSTAN, GamaSTAN S/D, Gamimune N, Gammagard, Gammagard S/D, Gammaked, Gammaplex, Gammar-P IV, Gamunex, Gamunex-C, Hizentra, Iveegam, Iveegam EN, Octagam, Panglobulin, Panglobulin NF, panzyga, Polygam S/D, Privigen, Sandoglobulin, Venoglobulin-S, Vigam, Vivaglobulin, Xembify What should I tell my care team before I take this medication? They need to know if you have any of these conditions: diabetes extremely low or no immune antibodies in the blood heart disease history of blood clots hyperprolinemia infection in the blood, sepsis kidney disease recently received or scheduled to receive a vaccination an unusual or allergic reaction to human immune globulin, albumin, maltose, sucrose, other medicines, foods, dyes, or preservatives pregnant or trying to get pregnant breast-feeding How should I use this medication? This medicine is for injection into a muscle or infusion into a vein or skin. It is usually given by a health care professional in a hospital or clinic setting. In rare cases, some brands of this medicine might be given at home.  You will be taught how to give this medicine. Use exactly as directed. Take your medicine at regular intervals. Do not take your medicine more often than directed. Talk to your pediatrician regarding the use of this medicine in children. While this drug may be prescribed for selected conditions, precautions do apply. Overdosage: If you think you have taken too much of this medicine contact a poison control center or emergency room at once. NOTE: This medicine  is only for you. Do not share this medicine with others. Overdosage: If you think you have taken too much of this medicine contact a poison control center or emergency room at once. NOTE: This medicine is only for you. Do not share this medicine with others. What if I miss a dose? It is important not to miss your dose. Call your doctor or health care professional if you are unable to keep an appointment. If you give yourself the medicine and you miss a dose, take it as soon as you can. If it is almost time for your next dose, take only that dose. Do not take double or extra doses. What may interact with this medication? aspirin and aspirin-like medicines cisplatin cyclosporine medicines for infection like acyclovir, adefovir, amphotericin B, bacitracin, cidofovir, foscarnet, ganciclovir, gentamicin, pentamidine, vancomycin NSAIDS, medicines for pain and inflammation, like ibuprofen or naproxen pamidronate vaccines zoledronic acid This list may not describe all possible interactions. Give your health care provider a list of all the medicines, herbs, non-prescription drugs, or dietary supplements you use. Also tell them if you smoke, drink alcohol, or use illegal drugs. Some items may interact with your medicine. This list may not describe all possible interactions. Give your health care provider a list of all the medicines, herbs, non-prescription drugs, or dietary supplements you use. Also tell them if you smoke, drink alcohol, or use illegal drugs. Some items may interact with your medicine. What should I watch for while using this medication? Your condition will be monitored carefully while you are receiving this medicine. This medicine is made from pooled blood donations of many different people. It may be possible to pass an infection in this medicine. However, the donors are screened for infections and all products are tested for HIV and hepatitis. The medicine is treated to kill most or all  bacteria and viruses. Talk to your doctor about the risks and benefits of this medicine. Do not have vaccinations for at least 14 days before, or until at least 3 months after receiving this medicine. What side effects may I notice from receiving this medication? Side effects that you should report to your doctor or health care professional as soon as possible: allergic reactions like skin rash, itching or hives, swelling of the face, lips, or tongue blue colored lips or skin breathing problems chest pain or tightness fever signs and symptoms of aseptic meningitis such as stiff neck; sensitivity to light; headache; drowsiness; fever; nausea; vomiting; rash signs and symptoms of a blood clot such as chest pain; shortness of breath; pain, swelling, or warmth in the leg signs and symptoms of hemolytic anemia such as fast heartbeat; tiredness; dark yellow or brown urine; or yellowing of the eyes or skin signs and symptoms of kidney injury like trouble passing urine or change in the amount of urine sudden weight gain swelling of the ankles, feet, hands Side effects that usually do not require medical attention (report to your doctor or health care professional if they continue or are bothersome): diarrhea flushing headache increased  sweating joint pain muscle cramps muscle pain nausea pain, redness, or irritation at site where injected tiredness This list may not describe all possible side effects. Call your doctor for medical advice about side effects. You may report side effects to FDA at 1-800-FDA-1088. This list may not describe all possible side effects. Call your doctor for medical advice about side effects. You may report side effects to FDA at 1-800-FDA-1088. Where should I keep my medication? Keep out of the reach of children. This drug is usually given in a hospital or clinic and will not be stored at home. In rare cases, some brands of this medicine may be given at home. If you  are using this medicine at home, you will be instructed on how to store this medicine. Throw away any unused medicine after the expiration date on the label. NOTE: This sheet is a summary. It may not cover all possible information. If you have questions about this medicine, talk to your doctor, pharmacist, or health care provider.  2024 Elsevier/Gold Standard (2018-11-18 00:00:00)

## 2023-05-19 ENCOUNTER — Inpatient Hospital Stay: Payer: 59 | Attending: Oncology

## 2023-05-19 VITALS — BP 141/91 | HR 61 | Temp 98.3°F | Resp 18 | Ht 70.0 in

## 2023-05-19 DIAGNOSIS — C7A00 Malignant carcinoid tumor of unspecified site: Secondary | ICD-10-CM | POA: Diagnosis present

## 2023-05-19 DIAGNOSIS — C7B8 Other secondary neuroendocrine tumors: Secondary | ICD-10-CM

## 2023-05-19 DIAGNOSIS — D696 Thrombocytopenia, unspecified: Secondary | ICD-10-CM | POA: Diagnosis present

## 2023-05-19 DIAGNOSIS — C7B02 Secondary carcinoid tumors of liver: Secondary | ICD-10-CM | POA: Diagnosis present

## 2023-05-19 MED ORDER — CYANOCOBALAMIN 1000 MCG/ML IJ SOLN
1000.0000 ug | Freq: Once | INTRAMUSCULAR | Status: AC
Start: 2023-05-19 — End: 2023-05-19
  Administered 2023-05-19: 1000 ug via INTRAMUSCULAR
  Filled 2023-05-19: qty 1

## 2023-05-19 MED ORDER — OCTREOTIDE ACETATE 30 MG IM KIT
30.0000 mg | PACK | Freq: Once | INTRAMUSCULAR | Status: AC
  Administered 2023-05-19: 30 mg via INTRAMUSCULAR
  Filled 2023-05-19: qty 1

## 2023-05-19 NOTE — Patient Instructions (Signed)
Octreotide Injection Suspension What is this medication? OCTREOTIDE (ok TREE oh tide) treats high levels of growth hormone (acromegaly). It works by reducing the amount of growth hormone your body makes. This reduces symptoms and the risk of health problems caused by too much growth hormone, such as diabetes and heart disease. It may also be used to treat diarrhea caused by neuroendocrine tumors. It works by slowing down the release of serotonin from the tumor cells. This reduces the number of bowel movements you have. This medicine may be used for other purposes; ask your health care provider or pharmacist if you have questions. COMMON BRAND NAME(S): Sandostatin LAR What should I tell my care team before I take this medication? They need to know if you have any of these conditions: Diabetes Gallbladder disease Heart disease Kidney disease Liver disease Pancreatic disease Thyroid disease An unusual or allergic reaction to octreotide, other medications, foods, dyes, or preservatives Pregnant or trying to get pregnant Breastfeeding How should I use this medication? This medication is injected into a muscle. It is usually given by your care team in a hospital or clinic setting. Talk to your care team about the use of this medication in children. Special care may be needed. Overdosage: If you think you have taken too much of this medicine contact a poison control center or emergency room at once. NOTE: This medicine is only for you. Do not share this medicine with others. What if I miss a dose? Keep appointments for follow-up doses. It is important not to miss your dose. Call your care team if you are unable to keep an appointment. What may interact with this medication? Do not take this medication with any of the following: Cisapride Dronedarone Flibanserin Lutetium Lu 177 dotatate Pimozide Saquinavir Thioridazine This medication may also interact with the  following: Bromocriptine Certain medications for blood pressure, heart disease, irregular heartbeat Cyclosporine Diuretics Medications for diabetes, including insulin Quinidine This list may not describe all possible interactions. Give your health care provider a list of all the medicines, herbs, non-prescription drugs, or dietary supplements you use. Also tell them if you smoke, drink alcohol, or use illegal drugs. Some items may interact with your medicine. What should I watch for while using this medication? Visit your care team for regular checks on your progress. Tell your care team if your symptoms do not start to get better or if they get worse. This medication may cause decreases in blood sugar. Signs of low blood sugar include chills, cool, pale skin or cold sweats, drowsiness, extreme hunger, fast heartbeat, headache, nausea, nervousness or anxiety, shakiness, trembling, unsteadiness, tiredness, or weakness. Contact your care team right away if you experience any of these symptoms. This medication may increase blood sugar. The risk may be higher in patients who already have diabetes. Ask your care team what you can do to lower your risk of diabetes while taking this medication. You should make sure you get enough vitamin B12 while you are taking this medication. Discuss the foods you eat and the vitamins you take with your care team. What side effects may I notice from receiving this medication? Side effects that you should report to your care team as soon as possible: Allergic reactions--skin rash, itching, hives, swelling of the face, lips, tongue, or throat Gallbladder problems--severe stomach pain, nausea, vomiting, fever Heart rhythm changes--fast or irregular heartbeat, dizziness, feeling faint or lightheaded, chest pain, trouble breathing High blood sugar (hyperglycemia)--increased thirst or amount of urine, unusual weakness or fatigue,  blurry vision Low blood sugar  (hypoglycemia)--tremors or shaking, anxiety, sweating, cold or clammy skin, confusion, dizziness, rapid heartbeat Low thyroid levels (hypothyroidism)--unusual weakness or fatigue, increased sensitivity to cold, constipation, hair loss, dry skin, weight gain, feelings of depression Low vitamin B12 level--pain, tingling, or numbness in the hands or feet, muscle weakness, dizziness, confusion, trouble concentrating Oily or light-colored stools, diarrhea, bloating, weight loss Pancreatitis--severe stomach pain that spreads to your back or gets worse after eating or when touched, fever, nausea, vomiting Slow heartbeat--dizziness, feeling faint or lightheaded, confusion, trouble breathing, unusual weakness or fatigue Side effects that usually do not require medical attention (report these to your care team if they continue or are bothersome): Diarrhea Dizziness Headache Nausea Pain, redness, or irritation at injection site Stomach pain This list may not describe all possible side effects. Call your doctor for medical advice about side effects. You may report side effects to FDA at 1-800-FDA-1088. Where should I keep my medication? This medication is given in a hospital or clinic. It will not be stored at home. NOTE: This sheet is a summary. It may not cover all possible information. If you have questions about this medicine, talk to your doctor, pharmacist, or health care provider.  2024 Elsevier/Gold Standard (2023-03-28 00:00:00)

## 2023-06-16 ENCOUNTER — Other Ambulatory Visit: Payer: 59

## 2023-06-16 ENCOUNTER — Ambulatory Visit: Payer: 59

## 2023-06-16 ENCOUNTER — Ambulatory Visit: Payer: 59 | Admitting: Hematology and Oncology

## 2023-06-18 ENCOUNTER — Telehealth: Payer: Self-pay | Admitting: Hematology and Oncology

## 2023-06-18 ENCOUNTER — Inpatient Hospital Stay (HOSPITAL_BASED_OUTPATIENT_CLINIC_OR_DEPARTMENT_OTHER): Payer: 59 | Admitting: Hematology and Oncology

## 2023-06-18 ENCOUNTER — Inpatient Hospital Stay: Payer: 59 | Attending: Oncology

## 2023-06-18 ENCOUNTER — Encounter: Payer: Self-pay | Admitting: Hematology and Oncology

## 2023-06-18 ENCOUNTER — Inpatient Hospital Stay: Payer: 59

## 2023-06-18 VITALS — BP 158/86

## 2023-06-18 VITALS — BP 150/98 | HR 60 | Temp 98.6°F | Resp 18 | Ht 70.0 in | Wt 253.1 lb

## 2023-06-18 DIAGNOSIS — C7A00 Malignant carcinoid tumor of unspecified site: Secondary | ICD-10-CM | POA: Diagnosis not present

## 2023-06-18 DIAGNOSIS — C7B02 Secondary carcinoid tumors of liver: Secondary | ICD-10-CM | POA: Diagnosis present

## 2023-06-18 DIAGNOSIS — D519 Vitamin B12 deficiency anemia, unspecified: Secondary | ICD-10-CM | POA: Insufficient documentation

## 2023-06-18 DIAGNOSIS — C7B8 Other secondary neuroendocrine tumors: Secondary | ICD-10-CM

## 2023-06-18 DIAGNOSIS — E538 Deficiency of other specified B group vitamins: Secondary | ICD-10-CM | POA: Diagnosis present

## 2023-06-18 LAB — CMP (CANCER CENTER ONLY)
ALT: 69 U/L — ABNORMAL HIGH (ref 0–44)
AST: 43 U/L — ABNORMAL HIGH (ref 15–41)
Albumin: 4.2 g/dL (ref 3.5–5.0)
Alkaline Phosphatase: 59 U/L (ref 38–126)
Anion gap: 9 (ref 5–15)
BUN: 15 mg/dL (ref 8–23)
CO2: 24 mmol/L (ref 22–32)
Calcium: 9 mg/dL (ref 8.9–10.3)
Chloride: 109 mmol/L (ref 98–111)
Creatinine: 1.69 mg/dL — ABNORMAL HIGH (ref 0.61–1.24)
GFR, Estimated: 45 mL/min — ABNORMAL LOW (ref >60)
Glucose, Bld: 98 mg/dL (ref 70–99)
Potassium: 4.3 mmol/L (ref 3.5–5.1)
Sodium: 141 mmol/L (ref 135–145)
Total Bilirubin: 0.7 mg/dL (ref 0.0–1.2)
Total Protein: 6.5 g/dL (ref 6.5–8.1)

## 2023-06-18 LAB — CBC WITH DIFFERENTIAL (CANCER CENTER ONLY)
Abs Immature Granulocytes: 0.03 10*3/uL (ref 0.00–0.07)
Basophils Absolute: 0.1 10*3/uL (ref 0.0–0.1)
Basophils Relative: 1 %
Eosinophils Absolute: 0.2 10*3/uL (ref 0.0–0.5)
Eosinophils Relative: 2 %
HCT: 46.2 % (ref 39.0–52.0)
Hemoglobin: 15.9 g/dL (ref 13.0–17.0)
Immature Granulocytes: 1 %
Lymphocytes Relative: 16 %
Lymphs Abs: 1 10*3/uL (ref 0.7–4.0)
MCH: 30.1 pg (ref 26.0–34.0)
MCHC: 34.4 g/dL (ref 30.0–36.0)
MCV: 87.3 fL (ref 80.0–100.0)
Monocytes Absolute: 0.4 10*3/uL (ref 0.1–1.0)
Monocytes Relative: 7 %
Neutro Abs: 4.6 10*3/uL (ref 1.7–7.7)
Neutrophils Relative %: 73 %
Platelet Count: 149 10*3/uL — ABNORMAL LOW (ref 150–400)
RBC: 5.29 MIL/uL (ref 4.22–5.81)
RDW: 13.8 % (ref 11.5–15.5)
WBC Count: 6.3 10*3/uL (ref 4.0–10.5)
nRBC: 0 % (ref 0.0–0.2)
nRBC: 0 /100{WBCs}

## 2023-06-18 LAB — VITAMIN B12: Vitamin B-12: 153 pg/mL — ABNORMAL LOW (ref 180–914)

## 2023-06-18 MED ORDER — OCTREOTIDE ACETATE 30 MG IM KIT
30.0000 mg | PACK | Freq: Once | INTRAMUSCULAR | Status: AC
  Administered 2023-06-18: 30 mg via INTRAMUSCULAR
  Filled 2023-06-18: qty 1

## 2023-06-18 MED ORDER — CYANOCOBALAMIN 1000 MCG/ML IJ SOLN
1000.0000 ug | Freq: Once | INTRAMUSCULAR | Status: AC
  Administered 2023-06-18: 1000 ug via INTRAMUSCULAR
  Filled 2023-06-18: qty 1

## 2023-06-18 NOTE — Assessment & Plan Note (Signed)
B12 deficiency diagnosed in November. He received a protracted course of B12 injections and continues B12 monthly.

## 2023-06-18 NOTE — Assessment & Plan Note (Signed)
Multiple liver masses, the largest measuring 4.0 x 4.3 cm, consistent with metastatic carcinoid with carcinoid syndrome, which were stable on CT imaging in October 2024. I will plan to repeat imaging in April.

## 2023-06-18 NOTE — Assessment & Plan Note (Addendum)
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  He does have a terminal ileum mass measuring 4.0 x 2.8 x 3.7 cm, and a mass of the mesentery of the small intestine measuring 3.9 x 2.3 x 4.0 cm.  As this has already metastasized to the liver, surgical resection was not indicated.  24 hour urine for 5 HIAA was quite elevated at 86.8 consistent with carcinoid syndrome.  Chromogranin A was also elevated at 214. He started treatment with octreotide injections every 4 weeks in October 2021.  Due to the persistent diarrhea, we increased the octreotide to 30 mg every 4 weeks. CT imaging in August 2022 was stable.  The chromogranin A from November 2022 was down to 118.6, but has been increasing this year.  CT scan in April 2023 remained stable.  The chromogranin A was 152 in July.  The patient was hospitalized in September. CT abdomen and pelvis at that time revealed small-bowel obstruction, with transition point in the mid to distal jejunum adjacent to the known mesenteric carcinoid tumor. Stable, 3.9 cm, calcified central mesenteric mass, consistent with carcinoid tumor. Numerous liver masses, consistent with metastatic carcinoid tumor. Index lesions not appreciably changed since prior exam.   The chromogranin A was as low as 125 in March 2024, but increased to 142 in June.  CT abdomen and pelvis in April did not reveal any progression of disease.  Chromogranin A was stable at 146 in August.  He had further elevation of the chromogranin A in October to 166.  CT abdomen pelvis and October revealed Stable calcified mesenteric mass and shotty sub-centimeter mesenteric lymph nodes, consistent with carcinoid tumor. Diffuse hepatic metastases and hepatic steatosis, without significant change. No new or progressive metastatic disease within the abdomen or pelvis. Left nephrolithiasis. No evidence of ureteral calculi or hydronephrosis. Small left inguinal hernia, which contained only fat.    He will proceed with octreotide today and continue octreotide every 4 weeks.  Chromogranin A is pending from today.  We will plan to see him back in 8 weeks with a CBC, comprehensive metabolic panel, chromogranin A, and CT abdomen and pelvis for repeat clinical assessment.

## 2023-06-18 NOTE — Patient Instructions (Signed)
Vitamin B12 Injection What is this medication? Vitamin B12 (VAHY tuh min B12) prevents and treats low vitamin B12 levels in your body. It is used in people who do not get enough vitamin B12 from their diet or when their digestive tract does not absorb enough. Vitamin B12 plays an important role in maintaining the health of your nervous system and red blood cells. This medicine may be used for other purposes; ask your health care provider or pharmacist if you have questions. COMMON BRAND NAME(S): B-12 Compliance Kit, B-12 Injection Kit, Cyomin, Dodex, LA-12, Nutri-Twelve, Physicians EZ Use B-12, Primabalt, Vitamin Deficiency Injectable System - B12 What should I tell my care team before I take this medication? They need to know if you have any of these conditions: Kidney disease Leber's disease Megaloblastic anemia An unusual or allergic reaction to cyanocobalamin, cobalt, other medications, foods, dyes, or preservatives Pregnant or trying to get pregnant Breast-feeding How should I use this medication? This medication is injected into a muscle or deeply under the skin. It is usually given in a clinic or care team's office. However, your care team may teach you how to inject yourself. Follow all instructions. Talk to your care team about the use of this medication in children. Special care may be needed. Overdosage: If you think you have taken too much of this medicine contact a poison control center or emergency room at once. NOTE: This medicine is only for you. Do not share this medicine with others. What if I miss a dose? If you are given your dose at a clinic or care team's office, call to reschedule your appointment. If you give your own injections, and you miss a dose, take it as soon as you can. If it is almost time for your next dose, take only that dose. Do not take double or extra doses. What may interact with this medication? Alcohol Colchicine This list may not describe all possible  interactions. Give your health care provider a list of all the medicines, herbs, non-prescription drugs, or dietary supplements you use. Also tell them if you smoke, drink alcohol, or use illegal drugs. Some items may interact with your medicine. What should I watch for while using this medication? Visit your care team regularly. You may need blood work done while you are taking this medication. You may need to follow a special diet. Talk to your care team. Limit your alcohol intake and avoid smoking to get the best benefit. What side effects may I notice from receiving this medication? Side effects that you should report to your care team as soon as possible: Allergic reactions--skin rash, itching, hives, swelling of the face, lips, tongue, or throat Swelling of the ankles, hands, or feet Trouble breathing Side effects that usually do not require medical attention (report to your care team if they continue or are bothersome): Diarrhea This list may not describe all possible side effects. Call your doctor for medical advice about side effects. You may report side effects to FDA at 1-800-FDA-1088. Where should I keep my medication? Keep out of the reach of children. Store at room temperature between 15 and 30 degrees C (59 and 85 degrees F). Protect from light. Throw away any unused medication after the expiration date. NOTE: This sheet is a summary. It may not cover all possible information. If you have questions about this medicine, talk to your doctor, pharmacist, or health care provider.  2024 Elsevier/Gold Standard (2020-12-26 00:00:00)Octreotide Injection Solution What is this medication? OCTREOTIDE (ok  TREE oh tide) treats high levels of growth hormone (acromegaly). It works by reducing the amount of growth hormone your body makes. This reduces symptoms and the risk of health problems caused by too much growth hormone, such as diabetes and heart disease. It may also be used to treat diarrhea  caused by neuroendocrine tumors. It works by slowing down the release of serotonin from the tumor cells. This reduces the number of bowel movements you have. This medicine may be used for other purposes; ask your health care provider or pharmacist if you have questions. COMMON BRAND NAME(S): Berline Lopes, Sandostatin What should I tell my care team before I take this medication? They need to know if you have any of these conditions: Diabetes Gallbladder disease Heart disease Kidney disease Liver disease Pancreatic disease Thyroid disease An unusual or allergic reaction to octreotide, other medications, foods, dyes, or preservatives Pregnant or trying to get pregnant Breastfeeding How should I use this medication? This medication is injected under the skin or into a vein. It is usually given by your care team in a hospital or clinic setting. If you get this medication at home, you will be taught how to prepare and give it. Use exactly as directed. Take it as directed on the prescription label at the same time every day. Keep taking it unless your care team tells you to stop. Allow the injection solution to come to room temperature before use. Do not warm it artificially. It is important that you put your used needles and syringes in a special sharps container. Do not put them in a trash can. If you do not have a sharps container, call your pharmacist or care team to get one. Talk to your care team about the use of this medication in children. Special care may be needed. Overdosage: If you think you have taken too much of this medicine contact a poison control center or emergency room at once. NOTE: This medicine is only for you. Do not share this medicine with others. What if I miss a dose? If you miss a dose, take it as soon as you can. If it is almost time for your next dose, take only that dose. Do not take double or extra doses. What may interact with this medication? Bromocriptine Certain  medications for blood pressure, heart disease, irregular heartbeat Cyclosporine Diuretics Medications for diabetes, including insulin Quinidine This list may not describe all possible interactions. Give your health care provider a list of all the medicines, herbs, non-prescription drugs, or dietary supplements you use. Also tell them if you smoke, drink alcohol, or use illegal drugs. Some items may interact with your medicine. What should I watch for while using this medication? Visit your care team for regular checks on your progress. Tell your care team if your symptoms do not start to get better or if they get worse. To help reduce irritation at the injection site, use a different site for each injection and make sure the solution is at room temperature before use. This medication may cause decreases in blood sugar. Signs of low blood sugar include chills, cool, pale skin or cold sweats, drowsiness, extreme hunger, fast heartbeat, headache, nausea, nervousness or anxiety, shakiness, trembling, unsteadiness, tiredness, or weakness. Contact your care team right away if you experience any of these symptoms. This medication may increase blood sugar. The risk may be higher in patients who already have diabetes. Ask your care team what you can do to lower your risk of diabetes while  taking this medication. You should make sure you get enough vitamin B12 while you are taking this medication. Discuss the foods you eat and the vitamins you take with your care team. What side effects may I notice from receiving this medication? Side effects that you should report to your care team as soon as possible: Allergic reactions--skin rash, itching, hives, swelling of the face, lips, tongue, or throat Gallbladder problems--severe stomach pain, nausea, vomiting, fever Heart rhythm changes--fast or irregular heartbeat, dizziness, feeling faint or lightheaded, chest pain, trouble breathing High blood sugar  (hyperglycemia)--increased thirst or amount of urine, unusual weakness or fatigue, blurry vision Low blood sugar (hypoglycemia)--tremors or shaking, anxiety, sweating, cold or clammy skin, confusion, dizziness, rapid heartbeat Low thyroid levels (hypothyroidism)--unusual weakness or fatigue, increased sensitivity to cold, constipation, hair loss, dry skin, weight gain, feelings of depression Low vitamin B12 level--pain, tingling, or numbness in the hands or feet, muscle weakness, dizziness, confusion, trouble concentrating Oily or light-colored stools, diarrhea, bloating, weight loss Pancreatitis--severe stomach pain that spreads to your back or gets worse after eating or when touched, fever, nausea, vomiting Slow heartbeat--dizziness, feeling faint or lightheaded, confusion, trouble breathing, unusual weakness or fatigue Side effects that usually do not require medical attention (report these to your care team if they continue or are bothersome): Diarrhea Dizziness Headache Nausea Pain, redness, or irritation at injection site Stomach pain This list may not describe all possible side effects. Call your doctor for medical advice about side effects. You may report side effects to FDA at 1-800-FDA-1088. Where should I keep my medication? Keep out of the reach of children and pets. Store in the refrigerator. Protect from light. Allow to come to room temperature naturally. Do not use artificial heat. If protected from light, the injection may be stored between 20 and 30 degrees C (70 and 86 degrees F) for 14 days. After the initial use, throw away any unused portion of a multiple dose vial after 14 days. Get rid of any unused portions of the ampules after use. To get rid of medications that are no longer needed or have expired: Take the medication to a medication take-back program. Ask your pharmacy or law enforcement to find a location. If you cannot return the medication, ask your pharmacist or  care team how to get rid of the medication safely. NOTE: This sheet is a summary. It may not cover all possible information. If you have questions about this medicine, talk to your doctor, pharmacist, or health care provider.  2024 Elsevier/Gold Standard (2023-03-28 00:00:00)

## 2023-06-18 NOTE — Telephone Encounter (Signed)
Contacted pt to schedule an appt. Unable to reach via phone, voicemail was left.    Scheduling Message Entered by Belva Crome A on 06/18/2023 at  1:06 PM Priority: Routine <No visit type provided>  Department: [facility]  Provider:  Scheduling Notes:  1. Please schedule B12 and Sandostatin in 4 weeks  2. Labs, CT abd/pelvis and f/u with Dr. Shyrl Numbers in 4 weeks prior to next injections  Thanks

## 2023-06-18 NOTE — Progress Notes (Addendum)
 [facility]  [address] [place],  [place]  [address] [phone]  Clinic Day:  06/18/2023  Referring physician: Mikki Santee Key, *  ASSESSMENT & PLAN:   Assessment & Plan: Malignant carcinoid tumor of unknown primary site [facility]) Metastatic carcinoid tumor consistent with gastrointestinal origin in September 2021.  He does have a terminal ileum mass measuring 4.0 x 2.8 x 3.7 cm, and a mass of the mesentery of the small intestine measuring 3.9 x 2.3 x 4.0 cm.  As this has already metastasized to the liver, surgical resection was not indicated.  24 hour urine for 5 HIAA was quite elevated at 86.8 consistent with carcinoid syndrome.  Chromogranin A was also elevated at 214. He started treatment with octreotide injections every 4 weeks in October 2021.  Due to the persistent diarrhea, we increased the octreotide to 30 mg every 4 weeks. CT imaging in August 2022 was stable.  The chromogranin A from November 2022 was down to 118.6, but has been increasing this year.  CT scan in April 2023 remained stable.  The chromogranin A was 152 in July.  The patient was hospitalized in September. CT abdomen and pelvis at that time revealed small-bowel obstruction, with transition point in the mid to distal jejunum adjacent to the known mesenteric carcinoid tumor. Stable, 3.9 cm, calcified central mesenteric mass, consistent with carcinoid tumor. Numerous liver masses, consistent with metastatic carcinoid tumor. Index lesions not appreciably changed since prior exam.   The chromogranin A was as low as 125 in March 2024, but increased to 142 in June.  CT abdomen and pelvis in April did not reveal any progression of disease.  Chromogranin A was stable at 146 in August.  He had further elevation of the chromogranin A in October to 166.  CT abdomen pelvis and October revealed Stable calcified mesenteric mass and shotty sub-centimeter mesenteric lymph nodes, consistent with carcinoid tumor. Diffuse  hepatic metastases and hepatic steatosis, without significant change. No new or progressive metastatic disease within the abdomen or pelvis. Left nephrolithiasis. No evidence of ureteral calculi or hydronephrosis. Small left inguinal hernia, which contained only fat.   He will proceed with octreotide today and continue octreotide every 4 weeks.  Chromogranin A is pending from today.  We will plan to see him back in 8 weeks with a CBC, comprehensive metabolic panel, chromogranin A, and CT abdomen and pelvis for repeat clinical assessment.   Secondary neuroendocrine tumor of liver (HCC) Multiple liver masses, the largest measuring 4.0 x 4.3 cm, consistent with metastatic carcinoid with carcinoid syndrome, which were stable on CT imaging in October 2024. I will plan to repeat imaging in April.  B12 deficiency anemia B12 deficiency diagnosed in November. He received a protracted course of B12 injections and continues B12 monthly.    The patient understands the plans discussed today and is in agreement with them.  He knows to contact our office if he develops concerns prior to his next appointment.   I provided 15 minutes of face-to-face time during this encounter and > 50% was spent counseling as documented under my assessment and plan.    Adah Perl, PA-C  [facility] [place] [facility] [address] [place] [address] Dept: [phone] Dept Fax: [phone]   No orders of the defined types were placed in this encounter.     CHIEF COMPLAINT:  CC: Malignant carcinoid and B12 deficiency  Current  Treatment: Octreotide/B12 monthly  HISTORY OF PRESENT ILLNESS:   Oncology History  Malignant carcinoid tumor of unknown primary site [facility])  02/11/2020 Initial Diagnosis   Malignant carcinoid tumor of unknown primary site [facility])       INTERVAL HISTORY:  Glenn Bender is here today for repeat clinical assessment.  He continues to  do well and denies complaints.  He continues octreotide and B12 every 4 weeks.  He denies fevers or chills. He denies pain. His appetite is good. His weight has decreased 4 pounds over last 8 weeks .  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  REVIEW OF SYSTEMS:  Review of Systems  Constitutional:  Negative for appetite change, chills, diaphoresis, fatigue, fever and unexpected weight change.  HENT:   Negative for lump/mass, mouth sores and sore throat.   Respiratory:  Negative for cough and shortness of breath.   Cardiovascular:  Negative for chest pain and leg swelling.  Gastrointestinal:  Negative for abdominal pain, blood in stool, constipation, diarrhea, nausea and vomiting.  Genitourinary:  Negative for difficulty urinating, dysuria, frequency and hematuria.   Musculoskeletal:  Negative for arthralgias, back pain and myalgias.  Skin:  Negative for itching, rash and wound.  Neurological:  Negative for dizziness, extremity weakness, headaches, light-headedness and numbness.  Hematological:  Negative for adenopathy.  Psychiatric/Behavioral:  Negative for depression and sleep disturbance. The patient is not nervous/anxious.      VITALS:  Blood pressure (!) 150/98, pulse 60, temperature 98.6 F (37 C), temperature source Oral, resp. rate 18, height 5\' 10"  (1.778 m), weight 253 lb 1.6 oz (114.8 kg), SpO2 97%.  Wt Readings from Last 3 Encounters:  06/18/23 253 lb 1.6 oz (114.8 kg)  04/21/23 257 lb 1.9 oz (116.6 kg)  04/07/23 254 lb (115.2 kg)    Body mass index is 36.32 kg/m.  Performance status (ECOG): 0 - Asymptomatic  PHYSICAL EXAM:  Physical Exam Vitals and nursing note reviewed.  Constitutional:      General: He is not in acute distress.    Appearance: Normal appearance. He is normal weight.  HENT:     Head: Normocephalic and atraumatic.     Mouth/Throat:     Mouth: Mucous membranes are moist.     Pharynx: Oropharynx is clear. No oropharyngeal exudate or  posterior oropharyngeal erythema.  Eyes:     General: No scleral icterus.    Extraocular Movements: Extraocular movements intact.     Conjunctiva/sclera: Conjunctivae normal.     Pupils: Pupils are equal, round, and reactive to light.  Cardiovascular:     Rate and Rhythm: Normal rate and regular rhythm.     Heart sounds: Normal heart sounds. No murmur heard.    No friction rub. No gallop.  Pulmonary:     Effort: Pulmonary effort is normal.     Breath sounds: Normal breath sounds. No wheezing, rhonchi or rales.  Abdominal:     General: Bowel sounds are normal. There is no distension.     Palpations: Abdomen is soft. There is no hepatomegaly, splenomegaly or mass.     Tenderness: There is no abdominal tenderness.  Musculoskeletal:        General: Normal range of motion.     Cervical back: Normal range of motion and neck supple. No tenderness.     Right lower leg: No edema.     Left lower leg: No edema.  Lymphadenopathy:     Cervical: No cervical adenopathy.     Upper Body:  Right upper body: No supraclavicular or axillary adenopathy.     Left upper body: No supraclavicular or axillary adenopathy.     Lower Body: No right inguinal adenopathy. No left inguinal adenopathy.  Skin:    General: Skin is warm and dry.     Coloration: Skin is not jaundiced.     Findings: No rash.  Neurological:     Mental Status: He is alert and oriented to person, place, and time.     Cranial Nerves: No cranial nerve deficit.  Psychiatric:        Mood and Affect: Mood normal.        Behavior: Behavior normal.        Thought Content: Thought content normal.     LABS:      Latest Ref Rng & Units 06/18/2023    9:56 AM 03/24/2023    9:26 AM 02/03/2023   12:00 AM  CBC  WBC 4.0 - 10.5 K/uL 6.3  7.4  6.5      Hemoglobin 13.0 - 17.0 g/dL 14.7  82.9  56.2      Hematocrit 39.0 - 52.0 % 46.2  45.1  43      Platelets 150 - 400 K/uL 149  160  126         This result is from an external source.       Latest Ref Rng & Units 03/24/2023    9:26 AM 02/03/2023   12:00 AM 12/10/2022    2:18 PM  CMP  Glucose 70 - 99 mg/dL 130   865   BUN 8 - 23 mg/dL 18  20     21    Creatinine 0.61 - 1.24 mg/dL 7.84  1.6     6.96   Sodium 135 - 145 mmol/L 142  136     138   Potassium 3.5 - 5.1 mmol/L 4.2  4.2     4.2   Chloride 98 - 111 mmol/L 106  110     108   CO2 22 - 32 mmol/L 24  21     21    Calcium 8.9 - 10.3 mg/dL 9.6  9.0     8.4   Total Protein 6.5 - 8.1 g/dL 6.7   6.1   Total Bilirubin <1.2 mg/dL 0.5   0.7   Alkaline Phos 38 - 126 U/L 61  45     42   AST 15 - 41 U/L 34  38     29   ALT 0 - 44 U/L 47  47     34      This result is from an external source.     STUDIES:  No results found.    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)    Malignant nonargentaffin carcinoid tumor (HCC)    Malignant nonargentaffin carcinoid tumor (HCC)    Secondary malignant carcinoid tumor of liver (HCC)    Thrombocytopenia (HCC) 03/24/2023    Past Surgical History:  Procedure Laterality Date   CYSTOTOMY  01/2020   IR URETERAL STENT LEFT NEW ACCESS W/O SEP NEPHROSTOMY CATH  06/13/2020   IR URETERAL STENT PLACEMENT EXISTING ACCESS LEFT  06/15/2020   LYMPH NODE BIOPSY Left  supraclavicular   NEPHROLITHOTOMY Left 06/13/2020   Procedure: NEPHROLITHOTOMY PERCUTANEOUS;  Surgeon: Noel Christmas, MD;  Location: [facility];  Service: Urology;  Laterality: Left;  2 HRS   ORCHIECTOMY  1996   UMBILICAL HERNIA REPAIR      Family History  Problem Relation Age of Onset   Cancer Mother        jaw bone   Throat cancer Father        smoker    Social History:  reports that he quit smoking about 28 years ago. His smoking use included cigarettes. He started smoking about 47 years ago. He has a 4.8 pack-year smoking  history. He has never used smokeless tobacco. He reports current alcohol use. He reports that he does not use drugs.The patient is alone today.  Allergies: No Known Allergies  Current Medications: Current Outpatient Medications  Medication Sig Dispense Refill   octreotide (SANDOSTATIN LAR) 20 MG injection Inject 20 mg into the muscle every 28 (twenty-eight) days.     No current facility-administered medications for this visit.

## 2023-06-19 ENCOUNTER — Telehealth: Payer: Self-pay | Admitting: Hematology and Oncology

## 2023-06-19 NOTE — Addendum Note (Signed)
Addended by: Belva Crome A on: 06/19/2023 01:25 PM   Modules accepted: Orders

## 2023-06-19 NOTE — Telephone Encounter (Signed)
Patient has been scheduled. Aware of appt date and time.    Scheduling Message Entered by Belva Crome A on 06/18/2023 at  1:06 PM Priority: Routine <No visit type provided>  Department: [facility]  Provider:  Scheduling Notes:  1. Please schedule B12 and Sandostatin in 4 weeks  2. Labs, CT abd/pelvis and f/u with Dr. Shyrl Numbers in 4 weeks prior to next injections  Thanks

## 2023-06-20 ENCOUNTER — Telehealth: Payer: Self-pay

## 2023-06-20 ENCOUNTER — Telehealth: Payer: Self-pay | Admitting: Hematology and Oncology

## 2023-06-20 LAB — CHROMOGRANIN A: Chromogranin A (ng/mL): 172.1 ng/mL — ABNORMAL HIGH (ref 0.0–101.8)

## 2023-06-20 NOTE — Telephone Encounter (Signed)
 Detailed message left for patient.

## 2023-06-20 NOTE — Telephone Encounter (Signed)
-----   Message from Glenn Bender sent at 06/19/2023  1:18 PM EST ----- Please let him know his B12 was low even with monthly injections. I would like to give every 2 weeks until we see him again. I will have scheduling contact him. Thanks

## 2023-06-20 NOTE — Telephone Encounter (Signed)
Contacted pt to schedule an appt. Unable to reach via phone, voicemail was left.   Scheduling Message Entered by Belva Crome A on 06/19/2023 at  1:24 PM Priority: Routine <No visit type provided>  Department: [facility]  Provider:  Scheduling Notes:  I have asked Dot Lanes to call him to let him know B12 is still low. Please schedule B12 for every 2 weeks until his next appt. Thanks

## 2023-06-23 ENCOUNTER — Telehealth: Payer: Self-pay | Admitting: Oncology

## 2023-06-23 NOTE — Telephone Encounter (Signed)
 06/23/23 Spoke with patient and confirmed next b12 injection.

## 2023-07-02 ENCOUNTER — Inpatient Hospital Stay: Payer: 59 | Attending: Oncology

## 2023-07-02 ENCOUNTER — Encounter: Payer: Self-pay | Admitting: Oncology

## 2023-07-02 VITALS — BP 137/78 | HR 66 | Temp 98.1°F | Resp 20 | Ht 70.0 in | Wt 254.1 lb

## 2023-07-02 DIAGNOSIS — C7B02 Secondary carcinoid tumors of liver: Secondary | ICD-10-CM | POA: Diagnosis not present

## 2023-07-02 DIAGNOSIS — C7A00 Malignant carcinoid tumor of unspecified site: Secondary | ICD-10-CM | POA: Diagnosis not present

## 2023-07-02 DIAGNOSIS — E538 Deficiency of other specified B group vitamins: Secondary | ICD-10-CM | POA: Insufficient documentation

## 2023-07-02 DIAGNOSIS — C7B8 Other secondary neuroendocrine tumors: Secondary | ICD-10-CM

## 2023-07-02 MED ORDER — CYANOCOBALAMIN 1000 MCG/ML IJ SOLN
1000.0000 ug | Freq: Once | INTRAMUSCULAR | Status: AC
  Administered 2023-07-02: 1000 ug via INTRAMUSCULAR
  Filled 2023-07-02: qty 1

## 2023-07-02 NOTE — Patient Instructions (Signed)
 Vitamin B12 Injection What is this medication? Vitamin B12 (VAHY tuh min B12) prevents and treats low vitamin B12 levels in your body. It is used in people who do not get enough vitamin B12 from their diet or when their digestive tract does not absorb enough. Vitamin B12 plays an important role in maintaining the health of your nervous system and red blood cells. This medicine may be used for other purposes; ask your health care provider or pharmacist if you have questions. COMMON BRAND NAME(S): B-12 Compliance Kit, B-12 Injection Kit, Cyomin, Dodex, LA-12, Nutri-Twelve, Physicians EZ Use B-12, Primabalt, Vitamin Deficiency Injectable System - B12 What should I tell my care team before I take this medication? They need to know if you have any of these conditions: Kidney disease Leber's disease Megaloblastic anemia An unusual or allergic reaction to cyanocobalamin, cobalt, other medications, foods, dyes, or preservatives Pregnant or trying to get pregnant Breast-feeding How should I use this medication? This medication is injected into a muscle or deeply under the skin. It is usually given in a clinic or care team's office. However, your care team may teach you how to inject yourself. Follow all instructions. Talk to your care team about the use of this medication in children. Special care may be needed. Overdosage: If you think you have taken too much of this medicine contact a poison control center or emergency room at once. NOTE: This medicine is only for you. Do not share this medicine with others. What if I miss a dose? If you are given your dose at a clinic or care team's office, call to reschedule your appointment. If you give your own injections, and you miss a dose, take it as soon as you can. If it is almost time for your next dose, take only that dose. Do not take double or extra doses. What may interact with this medication? Alcohol Colchicine This list may not describe all possible  interactions. Give your health care provider a list of all the medicines, herbs, non-prescription drugs, or dietary supplements you use. Also tell them if you smoke, drink alcohol, or use illegal drugs. Some items may interact with your medicine. What should I watch for while using this medication? Visit your care team regularly. You may need blood work done while you are taking this medication. You may need to follow a special diet. Talk to your care team. Limit your alcohol intake and avoid smoking to get the best benefit. What side effects may I notice from receiving this medication? Side effects that you should report to your care team as soon as possible: Allergic reactions--skin rash, itching, hives, swelling of the face, lips, tongue, or throat Swelling of the ankles, hands, or feet Trouble breathing Side effects that usually do not require medical attention (report to your care team if they continue or are bothersome): Diarrhea This list may not describe all possible side effects. Call your doctor for medical advice about side effects. You may report side effects to FDA at 1-800-FDA-1088. Where should I keep my medication? Keep out of the reach of children. Store at room temperature between 15 and 30 degrees C (59 and 85 degrees F). Protect from light. Throw away any unused medication after the expiration date. NOTE: This sheet is a summary. It may not cover all possible information. If you have questions about this medicine, talk to your doctor, pharmacist, or health care provider.  2024 Elsevier/Gold Standard (2020-12-26 00:00:00)

## 2023-07-03 ENCOUNTER — Telehealth: Payer: Self-pay

## 2023-07-03 NOTE — Telephone Encounter (Signed)
 [facility] Clinical Social Work  Clinical Social Work was referred by  [employer]  for assessment of psychosocial needs (Medication Assistance).  Clinical Social Worker attempted to contact patient by phone to offer support and assess for needs.  CSW left vm with direct contact for patient to return call. CSW will attempt again.  Marguerita Merles, LCSW  Clinical Social Worker [facility]

## 2023-07-08 DIAGNOSIS — N200 Calculus of kidney: Secondary | ICD-10-CM | POA: Diagnosis not present

## 2023-07-08 DIAGNOSIS — Z125 Encounter for screening for malignant neoplasm of prostate: Secondary | ICD-10-CM | POA: Diagnosis not present

## 2023-07-08 DIAGNOSIS — N401 Enlarged prostate with lower urinary tract symptoms: Secondary | ICD-10-CM | POA: Diagnosis not present

## 2023-07-14 ENCOUNTER — Telehealth: Payer: Self-pay

## 2023-07-14 NOTE — Telephone Encounter (Signed)
 [facility] CSW Progress Note  Clinical Child psychotherapist contacted patient by phone as requested by patient. Patient is waiting to hear back from Pharmacy team on mfg assistance for Sandostatin. At this time, pharmacy team is still working on submitting claims. CSW will attempt to follow up in one week for status update.    Marguerita Merles, LCSW Clinical Social Worker [facility]

## 2023-07-15 NOTE — Progress Notes (Addendum)
 [facility]  [address] [place],  [place]  [address] [phone]    Clinic Day: 07/16/23  Referring physician: Allen, [place], NP  ASSESSMENT & PLAN:  Assessment: Malignant carcinoid tumor of unknown primary site [facility] Metastatic carcinoid tumor consistent with gastrointestinal origin in September 2021.  He does have a terminal ileum mass measuring 4.0 x 2.8 x 3.7 cm, and a mass of the mesentery of the small intestine measuring 3.9 x 2.3 x 4.0 cm.  As this has already metastasized to the liver, surgical resection was not indicated.  24 hour urine for 5 HIAA was quite elevated at 86.8 consistent with carcinoid syndrome.  Chromogranin A was also elevated at 214. He started treatment with octreotide injections every 4 weeks in October 2021.  Due to the persistent diarrhea, we increased the octreotide to 30 mg every 4 weeks. CT imaging in August 2022 was stable.  The chromogranin A from November 2022 was down to 118.6, but has been increasing this year.  CT scan in April 2023 remained stable.  The chromogranin A was 152 in July.  The patient was hospitalized in September. CT abdomen and pelvis at that time revealed small-bowel obstruction, with transition point in the mid to distal jejunum adjacent to the known mesenteric carcinoid tumor. Stable, 3.9 cm, calcified central mesenteric mass, consistent with carcinoid tumor. He has numerous liver masses, consistent with metastatic carcinoid tumor. Index lesions not appreciably changed since prior exam. His scans have been relatively stable and he does have left nephrolithiasis. His chromogranin A fluctuates up and down.     Secondary neuroendocrine tumor of liver (HCC) Multiple liver masses, the largest measuring 4.0 x 4.3 cm, consistent with metastatic carcinoid with carcinoid syndrome, which were stable on CT imaging in October 2024. I will plan to repeat imaging in April.   B12 deficiency anemia B12 deficiency diagnosed in November. He  received a protracted course of B12 injections and continues B12 monthly. His B12 level improved to 153 last time, but remains low despite a protracted course of B12 injections and continuing monthly.  I had him get B12 injections every 2 weeks for 2 months.    Plan: He has a repeat CT abdomen and pelvis on April 10th and will receive his Sandostatin injection on April 16th so I will plan to see him at that time to review the CT results. He has a WBC of 6.4, hemoglobin of 15.2, and a low platelet count of 135,000 down from 149,000. His chromogranin A and B-12 level today are pending. His last chromogranin A increased from 146.6 to 172.1 as of 06/18/2023. I will add a CMP to his labs and call him with the results. He informed me that he has seen Dr. Saddie Benders, his kidney stone is larger and they plan lithotripsy next time. He will receive his B-12 injection today and will receive this monthly. I will see him back in 4 weeks with CBC and CMP. The patient understands the plans discussed today and is in agreement with them.  He knows to contact our office if he develops concerns prior to his next appointment.  I provided 17 minutes of face-to-face time during this encounter and > 50% was spent counseling as documented under my assessment and plan.   Dellia Beckwith, MD [facility] [address] [place] [address] Dept: [phone] Dept Fax: [phone]   No orders of  the defined types were placed in this encounter.   CHIEF COMPLAINT:  CC: Malignant carcinoid and B12 deficiency  Current Treatment: Octreotide/B12 monthly  HISTORY OF PRESENT ILLNESS:  Glenn Bender is a 63 y.o. male with metastatic carcinoid to liver diagnosed in September 2021.  The patient presented in September with hematuria, felt to be due to a kidney stone.  CT abdomen and pelvis revealed multiple liver masses, the largest measuring 4.0 x 4.3 cm,  a terminal ileum mass measuring 4.0 x 2.8 x 3.7 cm, and a mass of the mesentery of the small intestine measuring 3.9 x 2.3 x 4.0 cm.  Diagnostic colonoscopy with Dr. Vinson Moselle in September revealed a large mucosal covered mass protruding through the ileocecal valve with peristalsis.  Biopsy was collected and surgical pathology was benign.  Liver biopsy revealed metastatic neuroendocrine tumor, grade 2, consistent with gastrointestinal primary. Synaptophysin and chromogranin-A immunostains were positive as well as CDX-2 supporting gastrointestinal origin.  Ki67 was 6%.  He reports flushing of his face with alcohol and certain foods.  He also reported diarrhea.  24 hour urine for 5HIAA was also elevated at 86.8, which is consistent with carcinoid.  Chromogranin A was elevated at 214 in October and came down to 116.9 in December.  CT chest from November 2021 revealed left paratracheal adenopathy and pleural nodularity along the right hemidiaphragm measuring up to 12 mm, worrisome for metastatic disease. As he had metastatic disease, resection of his primary was not recommended. The patient was started on octreotide 20 mg IM monthly in October.  Octreotide was subsequently increased to 30 mg monthly in December due to persistent diarrhea.   CT chest, abdomen and pelvis in February 2022 revealed mostly stable to slightly decreased appearance of the carcinoid tumor, including the mesenteric lesion.  The liver lesions were stable with no new lesions. There was stable nodularity/lobularity along the right hemidiaphragm.  He has had a continued decrease in the Chromogranin A, which was 111.4 in February, and then 100.3 in March, which is within normal limits.  He had improvement in his diarrhea with the increase in octreotide. The chromogranin A was up slightly in May to 126. CT chest, abdomen and pelvis in August revealed no new or progressive metastatic disease.  Findings are suggestive of multifocal small bowel carcinoid  with conglomerate mesenteric nodal metastases.  Widespread hyperenhancing liver metastases are stable.  Stable clustered subpleural solid pulmonary nodules along the right hemidiaphragm in the basilar right lower lobe are compatible with pulmonary metastases.  There is new background diffuse hepatic steatosis. Chromogranin A was down to 107.5 in August.  The chromogranin A was 118 in November.   CT abdomen and pelvis in April 2023 revealed stable hepatic metastatic disease and stable hepatic steatosis.  There was a stable 3.6 cm partially calcified mesenteric mass, consistent with carcinoid tumor.  No evidence of new or progressive metastatic disease.  Bilateral nephrolithiasis, a small left inguinal hernia and aortic atherosclerosis were also seen.  He was hospitalized in September 2023 at [facility] with a small bowel obstruction.  CT abdomen and pelvis at that time revealed small-bowel obstruction, with transition point in the mid to distal jejunum adjacent to the known mesenteric carcinoid tumor. There was a stable calcified central mesenteric mass consistent with carcinoid tumor, and numerous liver masses, consistent with metastatic carcinoid tumor. The index lesions are not appreciably changed since prior exam. Hepatic steatosis, a non-obstructing left renal calculus and aortic atherosclerosis were seen.  We have continued octreotide 30  mg every 4 weeks with good control of his diarrhea.  He had a follow-up x-ray for his left kidney stone in December, which was stable.     CT abdomen and pelvis in April revealed no significant change in the calcified retractile mass in the base of the mesentery, typical of carcinoid. No gross change in the previously demonstrated hepatic metastases, although evaluation is limited by the lack of intravenous contrast and interval improvement in background hepatic steatosis. Interval resolution of previously demonstrated small bowel distension. No evidence of progressive  metastatic disease. Nonobstructing left renal calculi.  Aortic Atherosclerosis.  He reported passing kidney stone in June.  He initially had a decrease in the chromogranin A, but it has fluctuated up and down for the past year.   Oncology History  Malignant carcinoid tumor of unknown primary site [facility])  02/11/2020 Initial Diagnosis   Malignant carcinoid tumor of unknown primary site [facility])     INTERVAL HISTORY:  Glenn Bender is here today for repeat clinical assessment for his malignant carcinoid and B12 deficiency. Patient states that he feels well and has no complaints of pain. He has a repeat CT abdomen and pelvis on April 10th and will receive his Sandostatin injection on April 16th so I will plan to see him at that time to review the CT results. His B12 level has improved to 153 last time, but remains low despite a protracted course of B12 injections and continuing monthly.  I had him get B12 injections every 2 weeks for 2 months.  He has a WBC of 6.4, hemoglobin of 15.2, and a low platelet count of 135,000 down from 149,000. His chromogranin A and B-12 level today are pending. His last chromogranin A increased from 146.6 to 172.1 as of 06/18/2023. I will add a CMP to his labs and call him with the results. He informed me that he has seen Dr. Saddie Benders, his kidney stone is larger and they plan lithotripsy next time. He will receive his B-12 injection today and will receive this monthly. I will see him back in 4 weeks with CBC and CMP.   He denies signs of infection such as sore throat, sinus drainage, cough, or urinary symptoms.  He denies fevers or recurrent chills. He denies pain. He denies nausea, vomiting, chest pain, dyspnea or cough. His appetite is wonderful and his weight has been stable.   REVIEW OF SYSTEMS:  Review of Systems  Constitutional: Negative.  Negative for appetite change, chills, diaphoresis, fatigue, fever and unexpected weight change.  HENT:  Negative.  Negative for hearing loss,  lump/mass, mouth sores, nosebleeds, sore throat, tinnitus, trouble swallowing and voice change.   Eyes: Negative.  Negative for eye problems and icterus.  Respiratory: Negative.  Negative for chest tightness, cough, hemoptysis, shortness of breath and wheezing.   Cardiovascular: Negative.  Negative for chest pain, leg swelling and palpitations.  Gastrointestinal: Negative.  Negative for abdominal distention, abdominal pain, blood in stool, constipation, diarrhea, nausea, rectal pain and vomiting.  Endocrine: Negative.  Negative for hot flashes.  Genitourinary: Negative.  Negative for bladder incontinence, difficulty urinating, dyspareunia, dysuria, frequency, hematuria, nocturia, pelvic pain and penile discharge.   Musculoskeletal: Negative.  Negative for arthralgias, back pain, flank pain, gait problem, myalgias, neck pain and neck stiffness.  Skin: Negative.  Negative for itching, rash and wound.  Neurological: Negative.  Negative for dizziness, extremity weakness, gait problem, headaches, light-headedness, numbness, seizures and speech difficulty.  Hematological: Negative.  Negative for adenopathy. Does not bruise/bleed easily.  Psychiatric/Behavioral: Negative.  Negative for confusion, decreased concentration, depression, sleep disturbance and suicidal ideas. The patient is not nervous/anxious.     VITALS:  Blood pressure (!) 141/85, pulse 61, temperature 97.7 F (36.5 C), temperature source Oral, resp. rate 18, height 5\' 10"  (1.778 m), weight 253 lb 3.2 oz (114.9 kg), SpO2 97%.  Wt Readings from Last 3 Encounters:  07/16/23 253 lb 3.2 oz (114.9 kg)  07/02/23 254 lb 1.3 oz (115.2 kg)  06/18/23 253 lb 1.6 oz (114.8 kg)    Body mass index is 36.33 kg/m.  Performance status (ECOG): 0 - Asymptomatic  PHYSICAL EXAM:  Physical Exam Vitals and nursing note reviewed.  Constitutional:      General: He is not in acute distress.    Appearance: Normal appearance. He is normal weight. He is not  ill-appearing, toxic-appearing or diaphoretic.  HENT:     Head: Normocephalic and atraumatic.     Right Ear: Tympanic membrane, ear canal and external ear normal. There is no impacted cerumen.     Left Ear: Tympanic membrane, ear canal and external ear normal. There is no impacted cerumen.     Nose: Nose normal. No congestion or rhinorrhea.     Mouth/Throat:     Mouth: Mucous membranes are moist.     Pharynx: Oropharynx is clear. No oropharyngeal exudate or posterior oropharyngeal erythema.  Eyes:     General: No scleral icterus.       Right eye: No discharge.        Left eye: No discharge.     Extraocular Movements: Extraocular movements intact.     Conjunctiva/sclera: Conjunctivae normal.     Pupils: Pupils are equal, round, and reactive to light.  Neck:     Vascular: No carotid bruit.  Cardiovascular:     Rate and Rhythm: Normal rate and regular rhythm.     Pulses: Normal pulses.     Heart sounds: Normal heart sounds. No murmur heard.    No friction rub. No gallop.  Pulmonary:     Effort: Pulmonary effort is normal.     Breath sounds: Normal breath sounds. No wheezing, rhonchi or rales.  Abdominal:     General: Bowel sounds are normal. There is no distension.     Palpations: Abdomen is soft. There is no hepatomegaly, splenomegaly or mass.     Tenderness: There is no abdominal tenderness. There is no left CVA tenderness or rebound.     Hernia: A hernia is present. Hernia is present in the ventral area.     Comments: Liver is barely palpable at the right costal margin. Midline ventral hernia just above the umbilicus.   Musculoskeletal:        General: Normal range of motion.     Cervical back: Normal range of motion and neck supple. No rigidity or tenderness.     Right lower leg: No edema.     Left lower leg: No edema.  Lymphadenopathy:     Cervical: No cervical adenopathy.     Upper Body:     Right upper body: No supraclavicular or axillary adenopathy.     Left upper body:  No supraclavicular or axillary adenopathy.     Lower Body: No right inguinal adenopathy. No left inguinal adenopathy.  Skin:    General: Skin is warm and dry.     Coloration: Skin is not jaundiced.     Findings: No rash.  Neurological:     General: No focal deficit present.  Mental Status: He is alert and oriented to person, place, and time. Mental status is at baseline.     Cranial Nerves: No cranial nerve deficit.     Sensory: No sensory deficit.     Motor: No weakness.     Coordination: Coordination normal.     Gait: Gait normal.     Deep Tendon Reflexes: Reflexes normal.  Psychiatric:        Mood and Affect: Mood normal.        Behavior: Behavior normal.        Thought Content: Thought content normal.        Judgment: Judgment normal.    LABS:      Latest Ref Rng & Units 07/16/2023    8:53 AM 06/18/2023    9:56 AM 03/24/2023    9:26 AM  CBC  WBC 4.0 - 10.5 K/uL 6.4  6.3  7.4   Hemoglobin 13.0 - 17.0 g/dL 57.8  46.9  62.9   Hematocrit 39.0 - 52.0 % 43.8  46.2  45.1   Platelets 150 - 400 K/uL 135  149  160       Latest Ref Rng & Units 07/16/2023    8:53 AM 06/18/2023    9:56 AM 03/24/2023    9:26 AM  CMP  Glucose 70 - 99 mg/dL 528  98  413   BUN 8 - 23 mg/dL 17  15  18    Creatinine 0.61 - 1.24 mg/dL 2.44  0.10  2.72   Sodium 135 - 145 mmol/L 141  141  142   Potassium 3.5 - 5.1 mmol/L 4.4  4.3  4.2   Chloride 98 - 111 mmol/L 107  109  106   CO2 22 - 32 mmol/L 22  24  24    Calcium 8.9 - 10.3 mg/dL 9.1  9.0  9.6   Total Protein 6.5 - 8.1 g/dL 6.3  6.5  6.7   Total Bilirubin 0.0 - 1.2 mg/dL 0.8  0.7  0.5   Alkaline Phos 38 - 126 U/L 61  59  61   AST 15 - 41 U/L 45  43  34   ALT 0 - 44 U/L 63  69  47   No results found for: "IRON", "TIBC", "FERRITIN" Lab Results  Component Value Date   FOLATE 11.2 03/24/2023   Lab Results  Component Value Date   VITAMINB12 297 07/16/2023   Component Ref Range & Units (hover) 3 wk ago (06/18/23) 7 mo ago (12/10/22) 9 mo  ago (10/15/22) 11 mo ago (07/23/22) 1 yr ago (04/02/22) 1 yr ago (02/25/22) 1 yr ago (11/26/21)  Chromogranin A (ng/mL)            STUDIES:  No results found.    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)    Malignant nonargentaffin carcinoid tumor (HCC)    Malignant nonargentaffin carcinoid tumor (HCC)    Secondary malignant carcinoid tumor of liver (HCC)    Thrombocytopenia (HCC) 03/24/2023    Past Surgical History:  Procedure Laterality Date   CYSTOTOMY  01/2020   IR URETERAL STENT LEFT NEW ACCESS W/O SEP NEPHROSTOMY CATH  06/13/2020   IR URETERAL STENT PLACEMENT EXISTING ACCESS LEFT  06/15/2020  LYMPH NODE BIOPSY Left    supraclavicular   NEPHROLITHOTOMY Left 06/13/2020   Procedure: NEPHROLITHOTOMY PERCUTANEOUS;  Surgeon: Noel Christmas, MD;  Location: [facility];  Service: Urology;  Laterality: Left;  2 HRS   ORCHIECTOMY  1996   UMBILICAL HERNIA REPAIR      Family History  Problem Relation Age of Onset   Cancer Mother        jaw bone   Throat cancer Father        smoker    Social History:  reports that he quit smoking about 28 years ago. His smoking use included cigarettes. He started smoking about 47 years ago. He has a 4.8 pack-year smoking history. He has never used smokeless tobacco. He reports current alcohol use. He reports that he does not use drugs.The patient is alone today.  Allergies: No Known Allergies  Current Medications: Current Outpatient Medications  Medication Sig Dispense Refill   octreotide (SANDOSTATIN LAR) 20 MG injection Inject 20 mg into the muscle every 28 (twenty-eight) days.     No current facility-administered medications for this visit.    I,Jasmine M Lassiter,acting as a scribe for Dellia Beckwith, MD.,have documented all relevant  documentation on the behalf of Dellia Beckwith, MD,as directed by  Dellia Beckwith, MD while in the presence of Dellia Beckwith, MD.

## 2023-07-16 ENCOUNTER — Telehealth: Payer: Self-pay | Admitting: Oncology

## 2023-07-16 ENCOUNTER — Inpatient Hospital Stay (HOSPITAL_BASED_OUTPATIENT_CLINIC_OR_DEPARTMENT_OTHER): Payer: 59 | Admitting: Oncology

## 2023-07-16 ENCOUNTER — Inpatient Hospital Stay: Payer: 59

## 2023-07-16 ENCOUNTER — Other Ambulatory Visit: Payer: Self-pay | Admitting: Oncology

## 2023-07-16 ENCOUNTER — Encounter: Payer: Self-pay | Admitting: Oncology

## 2023-07-16 ENCOUNTER — Other Ambulatory Visit: Payer: Self-pay

## 2023-07-16 VITALS — BP 141/85 | HR 61 | Temp 97.7°F | Resp 18 | Ht 70.0 in | Wt 253.2 lb

## 2023-07-16 DIAGNOSIS — C7A00 Malignant carcinoid tumor of unspecified site: Secondary | ICD-10-CM

## 2023-07-16 DIAGNOSIS — D519 Vitamin B12 deficiency anemia, unspecified: Secondary | ICD-10-CM | POA: Diagnosis not present

## 2023-07-16 DIAGNOSIS — C7B8 Other secondary neuroendocrine tumors: Secondary | ICD-10-CM | POA: Diagnosis not present

## 2023-07-16 LAB — CBC WITH DIFFERENTIAL (CANCER CENTER ONLY)
Abs Immature Granulocytes: 0.04 10*3/uL (ref 0.00–0.07)
Basophils Absolute: 0.1 10*3/uL (ref 0.0–0.1)
Basophils Relative: 1 %
Eosinophils Absolute: 0.2 10*3/uL (ref 0.0–0.5)
Eosinophils Relative: 3 %
HCT: 43.8 % (ref 39.0–52.0)
Hemoglobin: 15.2 g/dL (ref 13.0–17.0)
Immature Granulocytes: 1 %
Immature Platelet Fraction: 2.8 % (ref 1.2–8.6)
Lymphocytes Relative: 16 %
Lymphs Abs: 1 10*3/uL (ref 0.7–4.0)
MCH: 30.3 pg (ref 26.0–34.0)
MCHC: 34.7 g/dL (ref 30.0–36.0)
MCV: 87.3 fL (ref 80.0–100.0)
Monocytes Absolute: 0.4 10*3/uL (ref 0.1–1.0)
Monocytes Relative: 7 %
Neutro Abs: 4.7 10*3/uL (ref 1.7–7.7)
Neutrophils Relative %: 72 %
Platelet Count: 135 10*3/uL — ABNORMAL LOW (ref 150–400)
RBC: 5.02 MIL/uL (ref 4.22–5.81)
RDW: 14.1 % (ref 11.5–15.5)
WBC Count: 6.4 10*3/uL (ref 4.0–10.5)
nRBC: 0 % (ref 0.0–0.2)
nRBC: 0 /100{WBCs}

## 2023-07-16 LAB — CMP (CANCER CENTER ONLY)
ALT: 63 U/L — ABNORMAL HIGH (ref 0–44)
AST: 45 U/L — ABNORMAL HIGH (ref 15–41)
Albumin: 4.6 g/dL (ref 3.5–5.0)
Alkaline Phosphatase: 61 U/L (ref 38–126)
Anion gap: 12 (ref 5–15)
BUN: 17 mg/dL (ref 8–23)
CO2: 22 mmol/L (ref 22–32)
Calcium: 9.1 mg/dL (ref 8.9–10.3)
Chloride: 107 mmol/L (ref 98–111)
Creatinine: 1.71 mg/dL — ABNORMAL HIGH (ref 0.61–1.24)
GFR, Estimated: 44 mL/min — ABNORMAL LOW (ref >60)
Glucose, Bld: 131 mg/dL — ABNORMAL HIGH (ref 70–99)
Potassium: 4.4 mmol/L (ref 3.5–5.1)
Sodium: 141 mmol/L (ref 135–145)
Total Bilirubin: 0.8 mg/dL (ref 0.0–1.2)
Total Protein: 6.3 g/dL — ABNORMAL LOW (ref 6.5–8.1)

## 2023-07-16 LAB — VITAMIN B12: Vitamin B-12: 297 pg/mL (ref 180–914)

## 2023-07-16 MED ORDER — OCTREOTIDE ACETATE 30 MG IM KIT
30.0000 mg | PACK | Freq: Once | INTRAMUSCULAR | Status: AC
  Administered 2023-07-16: 30 mg via INTRAMUSCULAR
  Filled 2023-07-16: qty 1

## 2023-07-16 MED ORDER — CYANOCOBALAMIN 1000 MCG/ML IJ SOLN
1000.0000 ug | Freq: Once | INTRAMUSCULAR | Status: AC
  Administered 2023-07-16: 1000 ug via INTRAMUSCULAR
  Filled 2023-07-16: qty 1

## 2023-07-16 NOTE — Patient Instructions (Signed)
 Vitamin B12 Injection What is this medication? Vitamin B12 (VAHY tuh min B12) prevents and treats low vitamin B12 levels in your body. It is used in people who do not get enough vitamin B12 from their diet or when their digestive tract does not absorb enough. Vitamin B12 plays an important role in maintaining the health of your nervous system and red blood cells. This medicine may be used for other purposes; ask your health care provider or pharmacist if you have questions. COMMON BRAND NAME(S): B-12 Compliance Kit, B-12 Injection Kit, Cyomin, Dodex, LA-12, Nutri-Twelve, Physicians EZ Use B-12, Primabalt, Vitamin Deficiency Injectable System - B12 What should I tell my care team before I take this medication? They need to know if you have any of these conditions: Kidney disease Leber's disease Megaloblastic anemia An unusual or allergic reaction to cyanocobalamin, cobalt, other medications, foods, dyes, or preservatives Pregnant or trying to get pregnant Breast-feeding How should I use this medication? This medication is injected into a muscle or deeply under the skin. It is usually given in a clinic or care team's office. However, your care team may teach you how to inject yourself. Follow all instructions. Talk to your care team about the use of this medication in children. Special care may be needed. Overdosage: If you think you have taken too much of this medicine contact a poison control center or emergency room at once. NOTE: This medicine is only for you. Do not share this medicine with others. What if I miss a dose? If you are given your dose at a clinic or care team's office, call to reschedule your appointment. If you give your own injections, and you miss a dose, take it as soon as you can. If it is almost time for your next dose, take only that dose. Do not take double or extra doses. What may interact with this medication? Alcohol Colchicine This list may not describe all possible  interactions. Give your health care provider a list of all the medicines, herbs, non-prescription drugs, or dietary supplements you use. Also tell them if you smoke, drink alcohol, or use illegal drugs. Some items may interact with your medicine. What should I watch for while using this medication? Visit your care team regularly. You may need blood work done while you are taking this medication. You may need to follow a special diet. Talk to your care team. Limit your alcohol intake and avoid smoking to get the best benefit. What side effects may I notice from receiving this medication? Side effects that you should report to your care team as soon as possible: Allergic reactions--skin rash, itching, hives, swelling of the face, lips, tongue, or throat Swelling of the ankles, hands, or feet Trouble breathing Side effects that usually do not require medical attention (report to your care team if they continue or are bothersome): Diarrhea This list may not describe all possible side effects. Call your doctor for medical advice about side effects. You may report side effects to FDA at 1-800-FDA-1088. Where should I keep my medication? Keep out of the reach of children. Store at room temperature between 15 and 30 degrees C (59 and 85 degrees F). Protect from light. Throw away any unused medication after the expiration date. NOTE: This sheet is a summary. It may not cover all possible information. If you have questions about this medicine, talk to your doctor, pharmacist, or health care provider.  2024 Elsevier/Gold Standard (2020-12-26 00:00:00)Octreotide Injection Solution What is this medication? OCTREOTIDE (ok  TREE oh tide) treats high levels of growth hormone (acromegaly). It works by reducing the amount of growth hormone your body makes. This reduces symptoms and the risk of health problems caused by too much growth hormone, such as diabetes and heart disease. It may also be used to treat diarrhea  caused by neuroendocrine tumors. It works by slowing down the release of serotonin from the tumor cells. This reduces the number of bowel movements you have. This medicine may be used for other purposes; ask your health care provider or pharmacist if you have questions. COMMON BRAND NAME(S): Berline Lopes, Sandostatin What should I tell my care team before I take this medication? They need to know if you have any of these conditions: Diabetes Gallbladder disease Heart disease Kidney disease Liver disease Pancreatic disease Thyroid disease An unusual or allergic reaction to octreotide, other medications, foods, dyes, or preservatives Pregnant or trying to get pregnant Breastfeeding How should I use this medication? This medication is injected under the skin or into a vein. It is usually given by your care team in a hospital or clinic setting. If you get this medication at home, you will be taught how to prepare and give it. Use exactly as directed. Take it as directed on the prescription label at the same time every day. Keep taking it unless your care team tells you to stop. Allow the injection solution to come to room temperature before use. Do not warm it artificially. It is important that you put your used needles and syringes in a special sharps container. Do not put them in a trash can. If you do not have a sharps container, call your pharmacist or care team to get one. Talk to your care team about the use of this medication in children. Special care may be needed. Overdosage: If you think you have taken too much of this medicine contact a poison control center or emergency room at once. NOTE: This medicine is only for you. Do not share this medicine with others. What if I miss a dose? If you miss a dose, take it as soon as you can. If it is almost time for your next dose, take only that dose. Do not take double or extra doses. What may interact with this medication? Bromocriptine Certain  medications for blood pressure, heart disease, irregular heartbeat Cyclosporine Diuretics Medications for diabetes, including insulin Quinidine This list may not describe all possible interactions. Give your health care provider a list of all the medicines, herbs, non-prescription drugs, or dietary supplements you use. Also tell them if you smoke, drink alcohol, or use illegal drugs. Some items may interact with your medicine. What should I watch for while using this medication? Visit your care team for regular checks on your progress. Tell your care team if your symptoms do not start to get better or if they get worse. To help reduce irritation at the injection site, use a different site for each injection and make sure the solution is at room temperature before use. This medication may cause decreases in blood sugar. Signs of low blood sugar include chills, cool, pale skin or cold sweats, drowsiness, extreme hunger, fast heartbeat, headache, nausea, nervousness or anxiety, shakiness, trembling, unsteadiness, tiredness, or weakness. Contact your care team right away if you experience any of these symptoms. This medication may increase blood sugar. The risk may be higher in patients who already have diabetes. Ask your care team what you can do to lower your risk of diabetes while  taking this medication. You should make sure you get enough vitamin B12 while you are taking this medication. Discuss the foods you eat and the vitamins you take with your care team. What side effects may I notice from receiving this medication? Side effects that you should report to your care team as soon as possible: Allergic reactions--skin rash, itching, hives, swelling of the face, lips, tongue, or throat Gallbladder problems--severe stomach pain, nausea, vomiting, fever Heart rhythm changes--fast or irregular heartbeat, dizziness, feeling faint or lightheaded, chest pain, trouble breathing High blood sugar  (hyperglycemia)--increased thirst or amount of urine, unusual weakness or fatigue, blurry vision Low blood sugar (hypoglycemia)--tremors or shaking, anxiety, sweating, cold or clammy skin, confusion, dizziness, rapid heartbeat Low thyroid levels (hypothyroidism)--unusual weakness or fatigue, increased sensitivity to cold, constipation, hair loss, dry skin, weight gain, feelings of depression Low vitamin B12 level--pain, tingling, or numbness in the hands or feet, muscle weakness, dizziness, confusion, trouble concentrating Oily or light-colored stools, diarrhea, bloating, weight loss Pancreatitis--severe stomach pain that spreads to your back or gets worse after eating or when touched, fever, nausea, vomiting Slow heartbeat--dizziness, feeling faint or lightheaded, confusion, trouble breathing, unusual weakness or fatigue Side effects that usually do not require medical attention (report these to your care team if they continue or are bothersome): Diarrhea Dizziness Headache Nausea Pain, redness, or irritation at injection site Stomach pain This list may not describe all possible side effects. Call your doctor for medical advice about side effects. You may report side effects to FDA at 1-800-FDA-1088. Where should I keep my medication? Keep out of the reach of children and pets. Store in the refrigerator. Protect from light. Allow to come to room temperature naturally. Do not use artificial heat. If protected from light, the injection may be stored between 20 and 30 degrees C (70 and 86 degrees F) for 14 days. After the initial use, throw away any unused portion of a multiple dose vial after 14 days. Get rid of any unused portions of the ampules after use. To get rid of medications that are no longer needed or have expired: Take the medication to a medication take-back program. Ask your pharmacy or law enforcement to find a location. If you cannot return the medication, ask your pharmacist or  care team how to get rid of the medication safely. NOTE: This sheet is a summary. It may not cover all possible information. If you have questions about this medicine, talk to your doctor, pharmacist, or health care provider.  2024 Elsevier/Gold Standard (2023-03-28 00:00:00)

## 2023-07-16 NOTE — Telephone Encounter (Signed)
 Patient has been scheduled for follow-up visit per 07/16/23 LOS.  Pt given an appt calendar with date and time.

## 2023-07-17 LAB — CHROMOGRANIN A: Chromogranin A (ng/mL): 143.2 ng/mL — ABNORMAL HIGH (ref 0.0–101.8)

## 2023-07-29 ENCOUNTER — Telehealth: Payer: Self-pay

## 2023-07-29 ENCOUNTER — Encounter: Payer: Self-pay | Admitting: Oncology

## 2023-07-29 NOTE — Telephone Encounter (Signed)
-----   Message from Dellia Beckwith sent at 07/29/2023  1:33 PM EDT ----- Regarding: call Tell him B12 is better and the cancer marker is back down.  That's good

## 2023-07-29 NOTE — Telephone Encounter (Signed)
 Attempted to contact patient. No answer.

## 2023-07-30 ENCOUNTER — Encounter: Payer: Self-pay | Admitting: Oncology

## 2023-08-07 ENCOUNTER — Ambulatory Visit (HOSPITAL_BASED_OUTPATIENT_CLINIC_OR_DEPARTMENT_OTHER)
Admission: RE | Admit: 2023-08-07 | Discharge: 2023-08-07 | Disposition: A | Discharge status: Home / Self Care | Source: Ambulatory Visit | Attending: Hematology and Oncology | Admitting: Hematology and Oncology

## 2023-08-07 DIAGNOSIS — N200 Calculus of kidney: Secondary | ICD-10-CM | POA: Diagnosis not present

## 2023-08-07 DIAGNOSIS — C7B8 Other secondary neuroendocrine tumors: Secondary | ICD-10-CM | POA: Diagnosis not present

## 2023-08-07 DIAGNOSIS — C7A00 Malignant carcinoid tumor of unspecified site: Secondary | ICD-10-CM | POA: Diagnosis not present

## 2023-08-07 DIAGNOSIS — C7A098 Malignant carcinoid tumors of other sites: Secondary | ICD-10-CM | POA: Diagnosis not present

## 2023-08-07 DIAGNOSIS — K449 Diaphragmatic hernia without obstruction or gangrene: Secondary | ICD-10-CM | POA: Diagnosis not present

## 2023-08-07 DIAGNOSIS — C787 Secondary malignant neoplasm of liver and intrahepatic bile duct: Secondary | ICD-10-CM | POA: Diagnosis not present

## 2023-08-07 MED ORDER — IOHEXOL 300 MG/ML  SOLN
100.0000 mL | Freq: Once | INTRAMUSCULAR | Status: AC | PRN
  Administered 2023-08-07: 100 mL via INTRAVENOUS

## 2023-08-12 NOTE — Progress Notes (Addendum)
 ADDENDUM:  CT scans reveals numerous hepatic metastases but index lesions in the dome and left liver have decreased in size and index lesion in the right liver is stable to slightly decreased. There is borderline lymphadenopathy in the central small bowel mesentery with an irregular 3.9 cm calcified mesenteric mass in the mid abdomen, overall stable with second are in the left lower quadrant measuring 2.6 cm. There are multiple soft tissue nodules in the central pelvis similar to prior and suspicious for metastases. He also has a stable staghorn calculus in the lower pole of the left kidney without hydronephrosis.     [facility]  [address] [place],  [place]  [address] [phone]    Clinic Day: 08/13/23  Referring physician: Allen, [place], NP  ASSESSMENT & PLAN:  Assessment: Malignant carcinoid tumor of unknown primary site [facility]) Metastatic carcinoid tumor consistent with gastrointestinal origin in September 2021.  He does have a terminal ileum mass measuring 4.0 x 2.8 x 3.7 cm, and a mass of the mesentery of the small intestine measuring 3.9 x 2.3 x 4.0 cm.  As this has already metastasized to the liver, surgical resection was not indicated.  24 hour urine for 5 HIAA was quite elevated at 86.8 consistent with carcinoid syndrome.  Chromogranin A was also elevated at 214. He started treatment with octreotide  injections every 4 weeks in October 2021.  Due to the persistent diarrhea, we increased the octreotide  to 30 mg every 4 weeks. CT imaging in August 2022 was stable.  The chromogranin A from November 2022 was down to 118.6, but has been increasing this year.  CT scan in April 2023 remained stable.  The chromogranin A was 152 in July.  The patient was hospitalized in September. CT abdomen and pelvis at that time revealed small-bowel obstruction, with transition point in the mid to distal jejunum adjacent to the known mesenteric carcinoid tumor. Stable, 3.9 cm, calcified central  mesenteric mass, consistent with carcinoid tumor. He has numerous liver masses, consistent with metastatic carcinoid tumor. Index lesions not appreciably changed since prior exam. His scans have been relatively stable and he does have left nephrolithiasis. His chromogranin A fluctuates up and down. His chromogranin A did come down from 172.1 to 143.2 as of March, 2025.    Secondary neuroendocrine tumor of liver (HCC) Multiple liver masses, the largest measuring 4.0 x 4.3 cm, consistent with metastatic carcinoid with carcinoid syndrome, which were stable on CT imaging in October 2024. CT abdomen and pelvis done on 0410/2025 and report is pending. We reviewed the images in detail and noticed a large kidney stone in the left kidney, he informed me that he is supposed to have a lithotripsy scheduled soon. His liver lesions appear similar to me but I will call him with the final results of his scan.   B12 deficiency anemia B12 deficiency diagnosed in November. He received a protracted course of B12 injections and continues B12 monthly. His B12 level improved to 153 last time, but remains low despite a protracted course of B12 injections and continuing monthly.  I had him get B12 injections every 2 weeks for 2 months.  Plan:  He had a CT abdomen and pelvis done and report is pending. We reviewed the images in detail and noticed a large kidney stone in the left kidney, he informed me that he is supposed to have a lithotripsy scheduled soon. His liver lesions appear similar to me but I will call him with the final  results of his scan. He has a WBC of 6.6, hemoglobin of 15.2, and a low platelet count of 139,000. His CMP is normal other than a elevated creatinine of 1.79 and ALT of 60. His chromogranin A did come down from 172.1 to 143.2 as of March. He will have his Sandostatin  and B-12 injections done every 4 weeks and I will see him back in 12 weeks with CBC, CMP, and chromogranin A. The patient understands the  plans discussed today and is in agreement with them.  He knows to contact our office if he develops concerns prior to his next appointment.  I provided 15 minutes of face-to-face time during this encounter and > 50% was spent counseling as documented under my assessment and plan.   Nolia Baumgartner, MD [facility] DE[facility] OF [facility] [address] [place] [address] Dept: [phone] Dept Fax: [phone]   No orders of the defined types were placed in this encounter.   CHIEF COMPLAINT:  CC: Malignant carcinoid and B12 deficiency  Current Treatment: Octreotide /B12 monthly  HISTORY OF PRESENT ILLNESS:  Glenn Bender is a 63 y.o. male with metastatic carcinoid to liver diagnosed in September 2021.  The patient presented in September with hematuria, felt to be due to a kidney stone.  CT abdomen and pelvis revealed multiple liver masses, the largest measuring 4.0 x 4.3 cm, a terminal ileum mass measuring 4.0 x 2.8 x 3.7 cm, and a mass of the mesentery of the small intestine measuring 3.9 x 2.3 x 4.0 cm.  Diagnostic colonoscopy with Dr. Maudie Sorrow in September revealed a large mucosal covered mass protruding through the ileocecal valve with peristalsis.  Biopsy was collected and surgical pathology was benign.  Liver biopsy revealed metastatic neuroendocrine tumor, grade 2, consistent with gastrointestinal primary. Synaptophysin and chromogranin-A immunostains were positive as well as CDX-2 supporting gastrointestinal origin.  Ki67 was 6%.  He reports flushing of his face with alcohol and certain foods.  He also reported diarrhea.  24 hour urine for 5HIAA was also elevated at 86.8, which is consistent with carcinoid.  Chromogranin A was elevated at 214 in October and came down to 116.9 in December.  CT chest from November 2021 revealed left paratracheal adenopathy and pleural nodularity along the right hemidiaphragm measuring up to 12 mm,  worrisome for metastatic disease. As he had metastatic disease, resection of his primary was not recommended. The patient was started on octreotide  20 mg IM monthly in October.  Octreotide  was subsequently increased to 30 mg monthly in December due to persistent diarrhea.   CT chest, abdomen and pelvis in February 2022 revealed mostly stable to slightly decreased appearance of the carcinoid tumor, including the mesenteric lesion.  The liver lesions were stable with no new lesions. There was stable nodularity/lobularity along the right hemidiaphragm.  He has had a continued decrease in the Chromogranin A, which was 111.4 in February, and then 100.3 in March, which is within normal limits.  He had improvement in his diarrhea with the increase in octreotide . The chromogranin A was up slightly in May to 126. CT chest, abdomen and pelvis in August revealed no new or progressive metastatic disease.  Findings are suggestive of multifocal small bowel carcinoid with conglomerate mesenteric nodal metastases.  Widespread hyperenhancing liver metastases are stable.  Stable clustered subpleural solid pulmonary nodules along the right hemidiaphragm in the basilar right lower lobe are compatible with pulmonary metastases.  There is  new background diffuse hepatic steatosis. Chromogranin A was down to 107.5 in August.  The chromogranin A was 118 in November.   CT abdomen and pelvis in April 2023 revealed stable hepatic metastatic disease and stable hepatic steatosis.  There was a stable 3.6 cm partially calcified mesenteric mass, consistent with carcinoid tumor.  No evidence of new or progressive metastatic disease.  Bilateral nephrolithiasis, a small left inguinal hernia and aortic atherosclerosis were also seen.  He was hospitalized in September 2023 at [facility] with a small bowel obstruction.  CT abdomen and pelvis at that time revealed small-bowel obstruction, with transition point in the mid to distal jejunum adjacent to the  known mesenteric carcinoid tumor. There was a stable calcified central mesenteric mass consistent with carcinoid tumor, and numerous liver masses, consistent with metastatic carcinoid tumor. The index lesions are not appreciably changed since prior exam. Hepatic steatosis, a non-obstructing left renal calculus and aortic atherosclerosis were seen.  We have continued octreotide  30 mg every 4 weeks with good control of his diarrhea.  He had a follow-up x-ray for his left kidney stone in December, which was stable.     CT abdomen and pelvis in April revealed no significant change in the calcified retractile mass in the base of the mesentery, typical of carcinoid. No gross change in the previously demonstrated hepatic metastases, although evaluation is limited by the lack of intravenous contrast and interval improvement in background hepatic steatosis. Interval resolution of previously demonstrated small bowel distension. No evidence of progressive metastatic disease. Nonobstructing left renal calculi.  Aortic Atherosclerosis.  He reported passing kidney stone in June.  He initially had a decrease in the chromogranin A, but it has fluctuated up and down for the past year.   Oncology History  Malignant carcinoid tumor of unknown primary site [facility])  02/11/2020 Initial Diagnosis   Malignant carcinoid tumor of unknown primary site [facility])     INTERVAL HISTORY:  Cesare is here today for repeat clinical assessment for his malignant carcinoid and B12 deficiency, diagnosed in September, 2021. Patient states that he feels well and has no complaints of pain. He had a CT abdomen and pelvis done and report is pending. We reviewed the images in detail and noticed a large kidney stone in the left kidney, he informed me that he is supposed to have a lithotripsy scheduled soon. His liver lesions appear similar to me but I will call him with the final results of his scan. He has a WBC of 6.6, hemoglobin of 15.2, and a low  platelet count of 139,000. His CMP is normal other than a elevated creatinine of 1.79 and ALT of 60. His chromogranin A did come down from 172.1 to 143.2 as of March. He will have his Sandostatin  and B-12 injections done every 4 weeks and I will see him back in 12 weeks with CBC, CMP, and chromogranin A.  He denies signs of infection such as sore throat, sinus drainage, cough, or urinary symptoms.  He denies fevers or recurrent chills. He denies pain. He denies nausea, vomiting, chest pain, dyspnea or cough. His appetite is good and his weight has increased 2 pounds over last 4 weeks .   REVIEW OF SYSTEMS:  Review of Systems  Constitutional: Negative.  Negative for appetite change, chills, diaphoresis, fatigue, fever and unexpected weight change.  HENT:  Negative.  Negative for hearing loss, lump/mass, mouth sores, nosebleeds, sore throat, tinnitus, trouble swallowing and voice change.   Eyes: Negative.  Negative for eye  problems and icterus.  Respiratory: Negative.  Negative for chest tightness, cough, hemoptysis, shortness of breath and wheezing.   Cardiovascular: Negative.  Negative for chest pain, leg swelling and palpitations.  Gastrointestinal: Negative.  Negative for abdominal distention, abdominal pain, blood in stool, constipation, diarrhea, nausea, rectal pain and vomiting.  Endocrine: Negative.  Negative for hot flashes.  Genitourinary: Negative.  Negative for bladder incontinence, difficulty urinating, dyspareunia, dysuria, frequency, hematuria, nocturia, pelvic pain and penile discharge.   Musculoskeletal: Negative.  Negative for arthralgias, back pain, flank pain, gait problem, myalgias, neck pain and neck stiffness.  Skin: Negative.  Negative for itching, rash and wound.  Neurological: Negative.  Negative for dizziness, extremity weakness, gait problem, headaches, light-headedness, numbness, seizures and speech difficulty.  Hematological: Negative.  Negative for adenopathy. Does not  bruise/bleed easily.  Psychiatric/Behavioral: Negative.  Negative for confusion, decreased concentration, depression, sleep disturbance and suicidal ideas. The patient is not nervous/anxious.     VITALS:  Blood pressure 137/82, pulse 69, temperature 97.6 F (36.4 C), temperature source Oral, resp. rate 18, height 5\' 10"  (1.778 m), weight 255 lb 4.8 oz (115.8 kg), SpO2 98%.  Wt Readings from Last 3 Encounters:  08/13/23 255 lb 4.8 oz (115.8 kg)  07/16/23 253 lb 3.2 oz (114.9 kg)  07/02/23 254 lb 1.3 oz (115.2 kg)    Body mass index is 36.63 kg/m.  Performance status (ECOG): 0 - Asymptomatic  PHYSICAL EXAM:  Physical Exam Vitals and nursing note reviewed.  Constitutional:      General: He is not in acute distress.    Appearance: Normal appearance. He is normal weight. He is not ill-appearing, toxic-appearing or diaphoretic.  HENT:     Head: Normocephalic and atraumatic.     Right Ear: Tympanic membrane, ear canal and external ear normal. There is no impacted cerumen.     Left Ear: Tympanic membrane, ear canal and external ear normal. There is no impacted cerumen.     Nose: Nose normal. No congestion or rhinorrhea.     Mouth/Throat:     Mouth: Mucous membranes are moist.     Pharynx: Oropharynx is clear. No oropharyngeal exudate or posterior oropharyngeal erythema.  Eyes:     General: No scleral icterus.       Right eye: No discharge.        Left eye: No discharge.     Extraocular Movements: Extraocular movements intact.     Conjunctiva/sclera: Conjunctivae normal.     Pupils: Pupils are equal, round, and reactive to light.  Neck:     Vascular: No carotid bruit.  Cardiovascular:     Rate and Rhythm: Normal rate and regular rhythm.     Pulses: Normal pulses.     Heart sounds: Normal heart sounds. No murmur heard.    No friction rub. No gallop.  Pulmonary:     Effort: Pulmonary effort is normal.     Breath sounds: Normal breath sounds. No wheezing, rhonchi or rales.   Abdominal:     General: Bowel sounds are normal. There is no distension.     Palpations: Abdomen is soft. There is no hepatomegaly, splenomegaly or mass.     Tenderness: There is no abdominal tenderness. There is no left CVA tenderness or rebound.     Hernia: A hernia is present. Hernia is present in the ventral area.     Comments: Liver is barely palpable at the right costal margin. Midline ventral hernia just above the umbilicus.   Musculoskeletal:  General: Normal range of motion.     Cervical back: Normal range of motion and neck supple. No rigidity or tenderness.     Right lower leg: No edema.     Left lower leg: No edema.  Lymphadenopathy:     Cervical: No cervical adenopathy.     Upper Body:     Right upper body: No supraclavicular or axillary adenopathy.     Left upper body: No supraclavicular or axillary adenopathy.     Lower Body: No right inguinal adenopathy. No left inguinal adenopathy.  Skin:    General: Skin is warm and dry.     Coloration: Skin is not jaundiced.     Findings: No rash.  Neurological:     General: No focal deficit present.     Mental Status: He is alert and oriented to person, place, and time. Mental status is at baseline.     Cranial Nerves: No cranial nerve deficit.     Sensory: No sensory deficit.     Motor: No weakness.     Coordination: Coordination normal.     Gait: Gait normal.     Deep Tendon Reflexes: Reflexes normal.  Psychiatric:        Mood and Affect: Mood normal.        Behavior: Behavior normal.        Thought Content: Thought content normal.        Judgment: Judgment normal.    LABS:      Latest Ref Rng & Units 08/13/2023    9:31 AM 07/16/2023    8:53 AM 06/18/2023    9:56 AM  CBC  WBC 4.0 - 10.5 K/uL 6.6  6.4  6.3   Hemoglobin 13.0 - 17.0 g/dL 16.1  09.6  04.5   Hematocrit 39.0 - 52.0 % 45.2  43.8  46.2   Platelets 150 - 400 K/uL 139  135  149       Latest Ref Rng & Units 08/13/2023    9:31 AM 07/16/2023    8:53  AM 06/18/2023    9:56 AM  CMP  Glucose 70 - 99 mg/dL 409  811  98   BUN 8 - 23 mg/dL 17  17  15    Creatinine 0.61 - 1.24 mg/dL 9.14  7.82  9.56   Sodium 135 - 145 mmol/L 139  141  141   Potassium 3.5 - 5.1 mmol/L 4.2  4.4  4.3   Chloride 98 - 111 mmol/L 108  107  109   CO2 22 - 32 mmol/L 21  22  24    Calcium 8.9 - 10.3 mg/dL 8.9  9.1  9.0   Total Protein 6.5 - 8.1 g/dL 6.5  6.3  6.5   Total Bilirubin 0.0 - 1.2 mg/dL 0.6  0.8  0.7   Alkaline Phos 38 - 126 U/L 56  61  59   AST 15 - 41 U/L 41  45  43   ALT 0 - 44 U/L 60  63  69   No results found for: "IRON", "TIBC", "FERRITIN" Lab Results  Component Value Date   FOLATE 11.2 03/24/2023   Lab Results  Component Value Date   VITAMINB12 297 07/16/2023   Component Ref Range & Units (hover) 3 wk ago (07/16/23) 1 mo ago (06/18/23) 8 mo ago (12/10/22) 10 mo ago (10/15/22) 1 yr ago (07/23/22) 1 yr ago (04/02/22) 1 yr ago (02/25/22)  Chromogranin A (ng/mL) 143.2 High  172.1 High  CM 146.6 High  CM 142.3 High  CM 124.9 High  CM 145.4 High  CM 168.9 High  CM  Comment: (NOTE)   STUDIES:  No results found.    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)    Malignant nonargentaffin carcinoid tumor (HCC)    Malignant nonargentaffin carcinoid tumor (HCC)    Secondary malignant carcinoid tumor of liver (HCC)    Thrombocytopenia (HCC) 03/24/2023    Past Surgical History:  Procedure Laterality Date   CYSTOTOMY  01/2020   IR URETERAL STENT LEFT NEW ACCESS W/O SEP NEPHROSTOMY CATH  06/13/2020   IR URETERAL STENT PLACEMENT EXISTING ACCESS LEFT  06/15/2020   LYMPH NODE BIOPSY Left    supraclavicular   NEPHROLITHOTOMY Left 06/13/2020   Procedure: NEPHROLITHOTOMY PERCUTANEOUS;  Surgeon: Roxane Copp, MD;  Location: [facility];  Service: Urology;   Laterality: Left;  2 HRS   ORCHIECTOMY  1996   UMBILICAL HERNIA REPAIR      Family History  Problem Relation Age of Onset   Cancer Mother        jaw bone   Throat cancer Father        smoker    Social History:  reports that he quit smoking about 28 years ago. His smoking use included cigarettes. He started smoking about 47 years ago. He has a 4.8 pack-year smoking history. He has never used smokeless tobacco. He reports current alcohol use. He reports that he does not use drugs.The patient is alone today.  Allergies: No Known Allergies  Current Medications: Current Outpatient Medications  Medication Sig Dispense Refill   octreotide  (SANDOSTATIN  LAR) 20 MG injection Inject 20 mg into the muscle every 28 (twenty-eight) days.     No current facility-administered medications for this visit.    I,Jasmine M Lassiter,acting as a scribe for Nolia Baumgartner, MD.,have documented all relevant documentation on the behalf of Nolia Baumgartner, MD,as directed by  Nolia Baumgartner, MD while in the presence of Nolia Baumgartner, MD.

## 2023-08-13 ENCOUNTER — Inpatient Hospital Stay

## 2023-08-13 ENCOUNTER — Encounter: Payer: Self-pay | Admitting: Oncology

## 2023-08-13 ENCOUNTER — Telehealth: Payer: Self-pay | Admitting: Oncology

## 2023-08-13 ENCOUNTER — Other Ambulatory Visit: Payer: Self-pay | Admitting: Oncology

## 2023-08-13 ENCOUNTER — Inpatient Hospital Stay: Admitting: Oncology

## 2023-08-13 ENCOUNTER — Inpatient Hospital Stay: Attending: Oncology

## 2023-08-13 VITALS — BP 137/82 | HR 69 | Temp 97.6°F | Resp 18 | Ht 70.0 in | Wt 255.3 lb

## 2023-08-13 DIAGNOSIS — C7B02 Secondary carcinoid tumors of liver: Secondary | ICD-10-CM | POA: Insufficient documentation

## 2023-08-13 DIAGNOSIS — C7A00 Malignant carcinoid tumor of unspecified site: Secondary | ICD-10-CM | POA: Diagnosis not present

## 2023-08-13 DIAGNOSIS — C7B8 Other secondary neuroendocrine tumors: Secondary | ICD-10-CM | POA: Diagnosis not present

## 2023-08-13 DIAGNOSIS — D538 Other specified nutritional anemias: Secondary | ICD-10-CM | POA: Insufficient documentation

## 2023-08-13 DIAGNOSIS — E538 Deficiency of other specified B group vitamins: Secondary | ICD-10-CM | POA: Diagnosis present

## 2023-08-13 LAB — CMP (CANCER CENTER ONLY)
ALT: 60 U/L — ABNORMAL HIGH (ref 0–44)
AST: 41 U/L (ref 15–41)
Albumin: 4.4 g/dL (ref 3.5–5.0)
Alkaline Phosphatase: 56 U/L (ref 38–126)
Anion gap: 9 (ref 5–15)
BUN: 17 mg/dL (ref 8–23)
CO2: 21 mmol/L — ABNORMAL LOW (ref 22–32)
Calcium: 8.9 mg/dL (ref 8.9–10.3)
Chloride: 108 mmol/L (ref 98–111)
Creatinine: 1.79 mg/dL — ABNORMAL HIGH (ref 0.61–1.24)
GFR, Estimated: 42 mL/min — ABNORMAL LOW (ref >60)
Glucose, Bld: 136 mg/dL — ABNORMAL HIGH (ref 70–99)
Potassium: 4.2 mmol/L (ref 3.5–5.1)
Sodium: 139 mmol/L (ref 135–145)
Total Bilirubin: 0.6 mg/dL (ref 0.0–1.2)
Total Protein: 6.5 g/dL (ref 6.5–8.1)

## 2023-08-13 LAB — CBC WITH DIFFERENTIAL (CANCER CENTER ONLY)
Abs Immature Granulocytes: 0.04 10*3/uL (ref 0.00–0.07)
Basophils Absolute: 0.1 10*3/uL (ref 0.0–0.1)
Basophils Relative: 1 %
Eosinophils Absolute: 0.2 10*3/uL (ref 0.0–0.5)
Eosinophils Relative: 2 %
HCT: 45.2 % (ref 39.0–52.0)
Hemoglobin: 15.2 g/dL (ref 13.0–17.0)
Immature Granulocytes: 1 %
Immature Platelet Fraction: 2.7 % (ref 1.2–8.6)
Lymphocytes Relative: 14 %
Lymphs Abs: 0.9 10*3/uL (ref 0.7–4.0)
MCH: 29.6 pg (ref 26.0–34.0)
MCHC: 33.6 g/dL (ref 30.0–36.0)
MCV: 88.1 fL (ref 80.0–100.0)
Monocytes Absolute: 0.5 10*3/uL (ref 0.1–1.0)
Monocytes Relative: 7 %
Neutro Abs: 4.9 10*3/uL (ref 1.7–7.7)
Neutrophils Relative %: 75 %
Platelet Count: 139 10*3/uL — ABNORMAL LOW (ref 150–400)
RBC: 5.13 MIL/uL (ref 4.22–5.81)
RDW: 13.7 % (ref 11.5–15.5)
WBC Count: 6.6 10*3/uL (ref 4.0–10.5)
nRBC: 0 % (ref 0.0–0.2)
nRBC: 0 /100{WBCs}

## 2023-08-13 MED ORDER — CYANOCOBALAMIN 1000 MCG/ML IJ SOLN
1000.0000 ug | Freq: Once | INTRAMUSCULAR | Status: AC
  Administered 2023-08-13: 1000 ug via INTRAMUSCULAR
  Filled 2023-08-13: qty 1

## 2023-08-13 MED ORDER — OCTREOTIDE ACETATE 30 MG IM KIT
30.0000 mg | PACK | Freq: Once | INTRAMUSCULAR | Status: AC
  Administered 2023-08-13: 30 mg via INTRAMUSCULAR
  Filled 2023-08-13: qty 1

## 2023-08-13 NOTE — Telephone Encounter (Signed)
 Patient has been scheduled for follow-up visit per 08/13/23 LOS.  Pt given an appt calendar with date and time.

## 2023-08-26 ENCOUNTER — Encounter: Payer: Self-pay | Admitting: Oncology

## 2023-08-28 ENCOUNTER — Telehealth: Payer: Self-pay

## 2023-08-28 NOTE — Telephone Encounter (Signed)
-----   Message from Nolia Baumgartner sent at 08/26/2023  7:28 PM EDT ----- Regarding: call Tell him scan looks good, as I thought.  Some of the liver lesions have decreased in size but possibly sl increase in some of the lymph nodes. Overall stable

## 2023-08-28 NOTE — Telephone Encounter (Signed)
 Attempted to contact patient. No answer.

## 2023-09-10 ENCOUNTER — Inpatient Hospital Stay: Attending: Oncology

## 2023-09-10 VITALS — BP 129/70 | HR 95 | Temp 98.2°F | Resp 18 | Ht 70.0 in | Wt 256.5 lb

## 2023-09-10 DIAGNOSIS — C7B02 Secondary carcinoid tumors of liver: Secondary | ICD-10-CM | POA: Diagnosis not present

## 2023-09-10 DIAGNOSIS — E538 Deficiency of other specified B group vitamins: Secondary | ICD-10-CM | POA: Insufficient documentation

## 2023-09-10 DIAGNOSIS — C7B8 Other secondary neuroendocrine tumors: Secondary | ICD-10-CM

## 2023-09-10 DIAGNOSIS — C7A00 Malignant carcinoid tumor of unspecified site: Secondary | ICD-10-CM | POA: Diagnosis not present

## 2023-09-10 MED ORDER — OCTREOTIDE ACETATE 30 MG IM KIT
30.0000 mg | PACK | Freq: Once | INTRAMUSCULAR | Status: AC
  Administered 2023-09-10: 30 mg via INTRAMUSCULAR
  Filled 2023-09-10: qty 1

## 2023-09-10 MED ORDER — CYANOCOBALAMIN 1000 MCG/ML IJ SOLN
1000.0000 ug | Freq: Once | INTRAMUSCULAR | Status: AC
Start: 2023-09-10 — End: 2023-09-10
  Administered 2023-09-10: 1000 ug via INTRAMUSCULAR
  Filled 2023-09-10: qty 1

## 2023-09-10 NOTE — Patient Instructions (Signed)
 Vitamin B12 Injection What is this medication? Vitamin B12 (VAHY tuh min B12) prevents and treats low vitamin B12 levels in your body. It is used in people who do not get enough vitamin B12 from their diet or when their digestive tract does not absorb enough. Vitamin B12 plays an important role in maintaining the health of your nervous system and red blood cells. This medicine may be used for other purposes; ask your health care provider or pharmacist if you have questions. COMMON BRAND NAME(S): B-12 Compliance Kit, B-12 Injection Kit, Cyomin, Dodex, LA-12, Nutri-Twelve, Physicians EZ Use B-12, Primabalt, Vitamin Deficiency Injectable System - B12 What should I tell my care team before I take this medication? They need to know if you have any of these conditions: Kidney disease Leber's disease Megaloblastic anemia An unusual or allergic reaction to cyanocobalamin, cobalt, other medications, foods, dyes, or preservatives Pregnant or trying to get pregnant Breast-feeding How should I use this medication? This medication is injected into a muscle or deeply under the skin. It is usually given in a clinic or care team's office. However, your care team may teach you how to inject yourself. Follow all instructions. Talk to your care team about the use of this medication in children. Special care may be needed. Overdosage: If you think you have taken too much of this medicine contact a poison control center or emergency room at once. NOTE: This medicine is only for you. Do not share this medicine with others. What if I miss a dose? If you are given your dose at a clinic or care team's office, call to reschedule your appointment. If you give your own injections, and you miss a dose, take it as soon as you can. If it is almost time for your next dose, take only that dose. Do not take double or extra doses. What may interact with this medication? Alcohol Colchicine This list may not describe all possible  interactions. Give your health care provider a list of all the medicines, herbs, non-prescription drugs, or dietary supplements you use. Also tell them if you smoke, drink alcohol, or use illegal drugs. Some items may interact with your medicine. What should I watch for while using this medication? Visit your care team regularly. You may need blood work done while you are taking this medication. You may need to follow a special diet. Talk to your care team. Limit your alcohol intake and avoid smoking to get the best benefit. What side effects may I notice from receiving this medication? Side effects that you should report to your care team as soon as possible: Allergic reactions--skin rash, itching, hives, swelling of the face, lips, tongue, or throat Swelling of the ankles, hands, or feet Trouble breathing Side effects that usually do not require medical attention (report to your care team if they continue or are bothersome): Diarrhea This list may not describe all possible side effects. Call your doctor for medical advice about side effects. You may report side effects to FDA at 1-800-FDA-1088. Where should I keep my medication? Keep out of the reach of children. Store at room temperature between 15 and 30 degrees C (59 and 85 degrees F). Protect from light. Throw away any unused medication after the expiration date. NOTE: This sheet is a summary. It may not cover all possible information. If you have questions about this medicine, talk to your doctor, pharmacist, or health care provider.  2024 Elsevier/Gold Standard (2020-12-26 00:00:00)Octreotide Injection Solution What is this medication? OCTREOTIDE (ok  TREE oh tide) treats high levels of growth hormone (acromegaly). It works by reducing the amount of growth hormone your body makes. This reduces symptoms and the risk of health problems caused by too much growth hormone, such as diabetes and heart disease. It may also be used to treat diarrhea  caused by neuroendocrine tumors. It works by slowing down the release of serotonin from the tumor cells. This reduces the number of bowel movements you have. This medicine may be used for other purposes; ask your health care provider or pharmacist if you have questions. COMMON BRAND NAME(S): Berline Lopes, Sandostatin What should I tell my care team before I take this medication? They need to know if you have any of these conditions: Diabetes Gallbladder disease Heart disease Kidney disease Liver disease Pancreatic disease Thyroid disease An unusual or allergic reaction to octreotide, other medications, foods, dyes, or preservatives Pregnant or trying to get pregnant Breastfeeding How should I use this medication? This medication is injected under the skin or into a vein. It is usually given by your care team in a hospital or clinic setting. If you get this medication at home, you will be taught how to prepare and give it. Use exactly as directed. Take it as directed on the prescription label at the same time every day. Keep taking it unless your care team tells you to stop. Allow the injection solution to come to room temperature before use. Do not warm it artificially. It is important that you put your used needles and syringes in a special sharps container. Do not put them in a trash can. If you do not have a sharps container, call your pharmacist or care team to get one. Talk to your care team about the use of this medication in children. Special care may be needed. Overdosage: If you think you have taken too much of this medicine contact a poison control center or emergency room at once. NOTE: This medicine is only for you. Do not share this medicine with others. What if I miss a dose? If you miss a dose, take it as soon as you can. If it is almost time for your next dose, take only that dose. Do not take double or extra doses. What may interact with this medication? Bromocriptine Certain  medications for blood pressure, heart disease, irregular heartbeat Cyclosporine Diuretics Medications for diabetes, including insulin Quinidine This list may not describe all possible interactions. Give your health care provider a list of all the medicines, herbs, non-prescription drugs, or dietary supplements you use. Also tell them if you smoke, drink alcohol, or use illegal drugs. Some items may interact with your medicine. What should I watch for while using this medication? Visit your care team for regular checks on your progress. Tell your care team if your symptoms do not start to get better or if they get worse. To help reduce irritation at the injection site, use a different site for each injection and make sure the solution is at room temperature before use. This medication may cause decreases in blood sugar. Signs of low blood sugar include chills, cool, pale skin or cold sweats, drowsiness, extreme hunger, fast heartbeat, headache, nausea, nervousness or anxiety, shakiness, trembling, unsteadiness, tiredness, or weakness. Contact your care team right away if you experience any of these symptoms. This medication may increase blood sugar. The risk may be higher in patients who already have diabetes. Ask your care team what you can do to lower your risk of diabetes while  taking this medication. You should make sure you get enough vitamin B12 while you are taking this medication. Discuss the foods you eat and the vitamins you take with your care team. What side effects may I notice from receiving this medication? Side effects that you should report to your care team as soon as possible: Allergic reactions--skin rash, itching, hives, swelling of the face, lips, tongue, or throat Gallbladder problems--severe stomach pain, nausea, vomiting, fever Heart rhythm changes--fast or irregular heartbeat, dizziness, feeling faint or lightheaded, chest pain, trouble breathing High blood sugar  (hyperglycemia)--increased thirst or amount of urine, unusual weakness or fatigue, blurry vision Low blood sugar (hypoglycemia)--tremors or shaking, anxiety, sweating, cold or clammy skin, confusion, dizziness, rapid heartbeat Low thyroid levels (hypothyroidism)--unusual weakness or fatigue, increased sensitivity to cold, constipation, hair loss, dry skin, weight gain, feelings of depression Low vitamin B12 level--pain, tingling, or numbness in the hands or feet, muscle weakness, dizziness, confusion, trouble concentrating Oily or light-colored stools, diarrhea, bloating, weight loss Pancreatitis--severe stomach pain that spreads to your back or gets worse after eating or when touched, fever, nausea, vomiting Slow heartbeat--dizziness, feeling faint or lightheaded, confusion, trouble breathing, unusual weakness or fatigue Side effects that usually do not require medical attention (report these to your care team if they continue or are bothersome): Diarrhea Dizziness Headache Nausea Pain, redness, or irritation at injection site Stomach pain This list may not describe all possible side effects. Call your doctor for medical advice about side effects. You may report side effects to FDA at 1-800-FDA-1088. Where should I keep my medication? Keep out of the reach of children and pets. Store in the refrigerator. Protect from light. Allow to come to room temperature naturally. Do not use artificial heat. If protected from light, the injection may be stored between 20 and 30 degrees C (70 and 86 degrees F) for 14 days. After the initial use, throw away any unused portion of a multiple dose vial after 14 days. Get rid of any unused portions of the ampules after use. To get rid of medications that are no longer needed or have expired: Take the medication to a medication take-back program. Ask your pharmacy or law enforcement to find a location. If you cannot return the medication, ask your pharmacist or  care team how to get rid of the medication safely. NOTE: This sheet is a summary. It may not cover all possible information. If you have questions about this medicine, talk to your doctor, pharmacist, or health care provider.  2024 Elsevier/Gold Standard (2023-03-28 00:00:00)

## 2023-10-01 DIAGNOSIS — Z6836 Body mass index (BMI) 36.0-36.9, adult: Secondary | ICD-10-CM | POA: Diagnosis not present

## 2023-10-01 DIAGNOSIS — L989 Disorder of the skin and subcutaneous tissue, unspecified: Secondary | ICD-10-CM | POA: Diagnosis not present

## 2023-10-01 DIAGNOSIS — E66812 Obesity, class 2: Secondary | ICD-10-CM | POA: Diagnosis not present

## 2023-10-01 DIAGNOSIS — M79671 Pain in right foot: Secondary | ICD-10-CM | POA: Diagnosis not present

## 2023-10-01 DIAGNOSIS — G8929 Other chronic pain: Secondary | ICD-10-CM | POA: Diagnosis not present

## 2023-10-08 ENCOUNTER — Inpatient Hospital Stay: Attending: Oncology

## 2023-10-08 VITALS — BP 135/88 | HR 60 | Temp 97.7°F | Resp 18

## 2023-10-08 DIAGNOSIS — C7A00 Malignant carcinoid tumor of unspecified site: Secondary | ICD-10-CM | POA: Insufficient documentation

## 2023-10-08 DIAGNOSIS — C7B8 Other secondary neuroendocrine tumors: Secondary | ICD-10-CM

## 2023-10-08 DIAGNOSIS — E538 Deficiency of other specified B group vitamins: Secondary | ICD-10-CM | POA: Diagnosis not present

## 2023-10-08 DIAGNOSIS — C7B02 Secondary carcinoid tumors of liver: Secondary | ICD-10-CM | POA: Diagnosis not present

## 2023-10-08 MED ORDER — OCTREOTIDE ACETATE 30 MG IM KIT
30.0000 mg | PACK | Freq: Once | INTRAMUSCULAR | Status: AC
  Administered 2023-10-08: 30 mg via INTRAMUSCULAR
  Filled 2023-10-08: qty 1

## 2023-10-08 MED ORDER — CYANOCOBALAMIN 1000 MCG/ML IJ SOLN
1000.0000 ug | Freq: Once | INTRAMUSCULAR | Status: AC
  Administered 2023-10-08: 1000 ug via INTRAMUSCULAR
  Filled 2023-10-08: qty 1

## 2023-10-08 NOTE — Patient Instructions (Signed)
 Octreotide  Injection Solution What is this medication? OCTREOTIDE  (ok TREE oh tide) treats high levels of growth hormone (acromegaly). It works by reducing the amount of growth hormone your body makes. This reduces symptoms and the risk of health problems caused by too much growth hormone, such as diabetes and heart disease. It may also be used to treat diarrhea caused by neuroendocrine tumors. It works by slowing down the release of serotonin from the tumor cells. This reduces the number of bowel movements you have. This medicine may be used for other purposes; ask your health care provider or pharmacist if you have questions. COMMON BRAND NAME(S): Bynfezia, Sandostatin  What should I tell my care team before I take this medication? They need to know if you have any of these conditions: Diabetes Gallbladder disease Heart disease Kidney disease Liver disease Pancreatic disease Thyroid disease An unusual or allergic reaction to octreotide , other medications, foods, dyes, or preservatives Pregnant or trying to get pregnant Breastfeeding How should I use this medication? This medication is injected under the skin or into a vein. It is usually given by your care team in a hospital or clinic setting. If you get this medication at home, you will be taught how to prepare and give it. Use exactly as directed. Take it as directed on the prescription label at the same time every day. Keep taking it unless your care team tells you to stop. Allow the injection solution to come to room temperature before use. Do not warm it artificially. It is important that you put your used needles and syringes in a special sharps container. Do not put them in a trash can. If you do not have a sharps container, call your pharmacist or care team to get one. Talk to your care team about the use of this medication in children. Special care may be needed. Overdosage: If you think you have taken too much of this medicine  contact a poison control center or emergency room at once. NOTE: This medicine is only for you. Do not share this medicine with others. What if I miss a dose? If you miss a dose, take it as soon as you can. If it is almost time for your next dose, take only that dose. Do not take double or extra doses. What may interact with this medication? Bromocriptine Certain medications for blood pressure, heart disease, irregular heartbeat Cyclosporine Diuretics Medications for diabetes, including insulin Quinidine This list may not describe all possible interactions. Give your health care provider a list of all the medicines, herbs, non-prescription drugs, or dietary supplements you use. Also tell them if you smoke, drink alcohol, or use illegal drugs. Some items may interact with your medicine. What should I watch for while using this medication? Visit your care team for regular checks on your progress. Tell your care team if your symptoms do not start to get better or if they get worse. To help reduce irritation at the injection site, use a different site for each injection and make sure the solution is at room temperature before use. This medication may cause decreases in blood sugar. Signs of low blood sugar include chills, cool, pale skin or cold sweats, drowsiness, extreme hunger, fast heartbeat, headache, nausea, nervousness or anxiety, shakiness, trembling, unsteadiness, tiredness, or weakness. Contact your care team right away if you experience any of these symptoms. This medication may increase blood sugar. The risk may be higher in patients who already have diabetes. Ask your care team what you can  do to lower your risk of diabetes while taking this medication. You should make sure you get enough vitamin B12 while you are taking this medication. Discuss the foods you eat and the vitamins you take with your care team. What side effects may I notice from receiving this medication? Side effects that  you should report to your care team as soon as possible: Allergic reactions--skin rash, itching, hives, swelling of the face, lips, tongue, or throat Gallbladder problems--severe stomach pain, nausea, vomiting, fever Heart rhythm changes--fast or irregular heartbeat, dizziness, feeling faint or lightheaded, chest pain, trouble breathing High blood sugar (hyperglycemia)--increased thirst or amount of urine, unusual weakness or fatigue, blurry vision Low blood sugar (hypoglycemia)--tremors or shaking, anxiety, sweating, cold or clammy skin, confusion, dizziness, rapid heartbeat Low thyroid levels (hypothyroidism)--unusual weakness or fatigue, increased sensitivity to cold, constipation, hair loss, dry skin, weight gain, feelings of depression Low vitamin B12 level--pain, tingling, or numbness in the hands or feet, muscle weakness, dizziness, confusion, trouble concentrating Oily or light-colored stools, diarrhea, bloating, weight loss Pancreatitis--severe stomach pain that spreads to your back or gets worse after eating or when touched, fever, nausea, vomiting Slow heartbeat--dizziness, feeling faint or lightheaded, confusion, trouble breathing, unusual weakness or fatigue Side effects that usually do not require medical attention (report these to your care team if they continue or are bothersome): Diarrhea Dizziness Headache Nausea Pain, redness, or irritation at injection site Stomach pain This list may not describe all possible side effects. Call your doctor for medical advice about side effects. You may report side effects to FDA at 1-800-FDA-1088. Where should I keep my medication? Keep out of the reach of children and pets. Store in the refrigerator. Protect from light. Allow to come to room temperature naturally. Do not use artificial heat. If protected from light, the injection may be stored between 20 and 30 degrees C (70 and 86 degrees F) for 14 days. After the initial use, throw away  any unused portion of a multiple dose vial after 14 days. Get rid of any unused portions of the ampules after use. To get rid of medications that are no longer needed or have expired: Take the medication to a medication take-back program. Ask your pharmacy or law enforcement to find a location. If you cannot return the medication, ask your pharmacist or care team how to get rid of the medication safely. NOTE: This sheet is a summary. It may not cover all possible information. If you have questions about this medicine, talk to your doctor, pharmacist, or health care provider.  2024 Elsevier/Gold Standard (2023-03-28 00:00:00)Vitamin B12 Injection What is this medication? Vitamin B12 (VAHY tuh min B12) prevents and treats low vitamin B12 levels in your body. It is used in people who do not get enough vitamin B12 from their diet or when their digestive tract does not absorb enough. Vitamin B12 plays an important role in maintaining the health of your nervous system and red blood cells. This medicine may be used for other purposes; ask your health care provider or pharmacist if you have questions. COMMON BRAND NAME(S): B-12 Compliance Kit, B-12 Injection Kit, Cyomin, Dodex , LA-12, Nutri-Twelve, Physicians EZ Use B-12, Primabalt, Vitamin Deficiency Injectable System - B12 What should I tell my care team before I take this medication? They need to know if you have any of these conditions: Kidney disease Leber's disease Megaloblastic anemia An unusual or allergic reaction to cyanocobalamin , cobalt, other medications, foods, dyes, or preservatives Pregnant or trying to get pregnant Breast-feeding  How should I use this medication? This medication is injected into a muscle or deeply under the skin. It is usually given in a clinic or care team's office. However, your care team may teach you how to inject yourself. Follow all instructions. Talk to your care team about the use of this medication in children.  Special care may be needed. Overdosage: If you think you have taken too much of this medicine contact a poison control center or emergency room at once. NOTE: This medicine is only for you. Do not share this medicine with others. What if I miss a dose? If you are given your dose at a clinic or care team's office, call to reschedule your appointment. If you give your own injections, and you miss a dose, take it as soon as you can. If it is almost time for your next dose, take only that dose. Do not take double or extra doses. What may interact with this medication? Alcohol Colchicine This list may not describe all possible interactions. Give your health care provider a list of all the medicines, herbs, non-prescription drugs, or dietary supplements you use. Also tell them if you smoke, drink alcohol, or use illegal drugs. Some items may interact with your medicine. What should I watch for while using this medication? Visit your care team regularly. You may need blood work done while you are taking this medication. You may need to follow a special diet. Talk to your care team. Limit your alcohol intake and avoid smoking to get the best benefit. What side effects may I notice from receiving this medication? Side effects that you should report to your care team as soon as possible: Allergic reactions--skin rash, itching, hives, swelling of the face, lips, tongue, or throat Swelling of the ankles, hands, or feet Trouble breathing Side effects that usually do not require medical attention (report to your care team if they continue or are bothersome): Diarrhea This list may not describe all possible side effects. Call your doctor for medical advice about side effects. You may report side effects to FDA at 1-800-FDA-1088. Where should I keep my medication? Keep out of the reach of children. Store at room temperature between 15 and 30 degrees C (59 and 85 degrees F). Protect from light. Throw away any  unused medication after the expiration date. NOTE: This sheet is a summary. It may not cover all possible information. If you have questions about this medicine, talk to your doctor, pharmacist, or health care provider.  2024 Elsevier/Gold Standard (2020-12-26 00:00:00)

## 2023-10-27 DIAGNOSIS — L821 Other seborrheic keratosis: Secondary | ICD-10-CM | POA: Diagnosis not present

## 2023-10-27 DIAGNOSIS — L814 Other melanin hyperpigmentation: Secondary | ICD-10-CM | POA: Diagnosis not present

## 2023-10-27 DIAGNOSIS — L578 Other skin changes due to chronic exposure to nonionizing radiation: Secondary | ICD-10-CM | POA: Diagnosis not present

## 2023-11-04 DIAGNOSIS — G4733 Obstructive sleep apnea (adult) (pediatric): Secondary | ICD-10-CM | POA: Diagnosis not present

## 2023-11-04 NOTE — Assessment & Plan Note (Deleted)
 Multiple liver masses, the largest measuring 4.0 x 4.3 cm, consistent with metastatic carcinoid with carcinoid syndrome.  His disease remains stable to improved on CT imaging in April.  He will continue octreotide  every 4 weeks.

## 2023-11-04 NOTE — Assessment & Plan Note (Deleted)
 B12 deficiency diagnosed in November 2024. He received a protracted course of B12 injections and continues B12 monthly.

## 2023-11-04 NOTE — Progress Notes (Deleted)
 [facility]  [address] [place],  [place]  [address] [phone]  Clinic Day:  11/04/2023  Referring physician: Allen, [place], NP  ASSESSMENT & PLAN:   Assessment & Plan: No problem-specific Assessment & Plan notes found for this encounter.    The patient understands the plans discussed today and is in agreement with them.  He knows to contact our office if he develops concerns prior to his next appointment.   I provided *** minutes of face-to-face time during this encounter and > 50% was spent counseling as documented under my assessment and plan.    Pariss Hommes A Britten Seyfried, PA-C  [facility] - A DEPT OF [facility] [address] [place] [address] Dept: [phone] Dept Fax: [phone]   No orders of the defined types were placed in this encounter.     CHIEF COMPLAINT:  CC: Malignant carcinoid  Current Treatment: Octreotide  every 4 weeks  HISTORY OF PRESENT ILLNESS:  Glenn Bender is a 63 y.o. male with metastatic carcinoid to liver diagnosed in September 2021.  The patient presented with hematuria, felt to be due to a kidney stone.  CT abdomen and pelvis revealed multiple liver masses, the largest measuring 4.0 x 4.3 cm, a terminal ileum mass measuring 4.0 x 2.8 x 3.7 cm, and a mass of the mesentery of the small intestine measuring 3.9 x 2.3 x 4.0 cm.  Diagnostic colonoscopy with Dr. Allana in September revealed a large mucosal covered mass protruding through the ileocecal valve with peristalsis.  Biopsy was collected and surgical pathology was benign.  Liver biopsy revealed metastatic neuroendocrine tumor, grade 2, consistent with gastrointestinal primary. Synaptophysin and chromogranin-A immunostains were positive as well as CDX-2 supporting gastrointestinal origin.  Ki67 was 6%.  He reports flushing of his face with alcohol and certain foods.  He also reported diarrhea.  24 hour urine for 5HIAA was  also elevated at 86.8, which is consistent with carcinoid.  Chromogranin A was elevated at 214 in October and came down to 116.9 in December.  CT chest from November 2021 revealed left paratracheal adenopathy and pleural nodularity along the right hemidiaphragm measuring up to 12 mm, worrisome for metastatic disease. As he had metastatic disease, resection of his primary was not recommended. The patient was started on octreotide  20 mg IM monthly in October.  Octreotide  was subsequently increased to 30 mg monthly in December due to persistent diarrhea.   CT chest, abdomen and pelvis in February 2022 revealed stable to slightly decreased appearance of the carcinoid tumor, including the mesenteric lesion.  The liver lesions were stable with no new lesions. There was stable nodularity/lobularity along the right hemidiaphragm.  He has had a continued decrease in the Chromogranin A, which was 111.4 in February, and then 100.3 in March, which is within normal limits.  He had improvement in his diarrhea with the increase in octreotide . The chromogranin A was up slightly in May to 126. CT chest, abdomen and pelvis in August revealed no new or progressive metastatic disease.  Findings are suggestive of multifocal small bowel carcinoid with conglomerate mesenteric nodal metastases.  Widespread hyperenhancing liver metastases are stable.  Stable clustered subpleural solid pulmonary nodules along the right hemidiaphragm in the basilar right lower lobe are compatible with pulmonary metastases.  There is new background diffuse hepatic steatosis. Chromogranin A was down to 107.5 in August.  The chromogranin A was 118 in November.   CT abdomen and  pelvis in April 2023 revealed stable hepatic metastatic disease and stable hepatic steatosis.  There was a stable 3.6 cm partially calcified mesenteric mass, consistent with carcinoid tumor.  No evidence of new or progressive metastatic disease.  Bilateral nephrolithiasis, a small left  inguinal hernia and aortic atherosclerosis were also seen.  He was hospitalized in September 2023 at [facility] with a small bowel obstruction.  CT abdomen and pelvis at that time revealed small-bowel obstruction, with transition point in the mid to distal jejunum adjacent to the known mesenteric carcinoid tumor. There was a stable calcified central mesenteric mass consistent with carcinoid tumor, and numerous liver masses, consistent with metastatic carcinoid tumor. The index lesions are not appreciably changed since prior exam. Hepatic steatosis, a non-obstructing left renal calculus and aortic atherosclerosis were seen.  We have continued octreotide  30 mg every 4 weeks with good control of his diarrhea.  He had a follow-up x-ray for his left kidney stone in December, which was stable.     CT abdomen and pelvis in April 2024 revealed no significant change in the calcified retractile mass in the base of the mesentery, typical of carcinoid. No gross change in the previously demonstrated hepatic metastases, although evaluation is limited by the lack of intravenous contrast and interval improvement in background hepatic steatosis. Interval resolution of previously demonstrated small bowel distension. No evidence of progressive metastatic disease. Nonobstructing left renal calculi.  Aortic Atherosclerosis.  He reported passing kidney stone in June.  The chromogranin A has been fairly stable over the past year, running around 145, with a max of 176.  CT abdomen and pelvis in October 2024 revealed stable calcified mesenteric mass and shotty subcentimeter mesenteric lymph nodes, as well as diffuse hepatic metastasis and hepatic steatosis without significant change.  No new or progressive metastatic disease was seen within the abdomen or pelvis.  Chromogranin A was 143 in March 2025.  CT abdomen and pelvis in April revealed numerous hepatic metastases again noted. Index lesions in the dome of the liver and left liver have  decreased in size in the interval. Index lesion in the posterior right liver is stable to slightly decreased in size in the interval. Borderline lymphadenopathy in the central small bowel mesentery with irregular 3.9 x 3.5 cm calcified mesenteric mass in the mid abdomen, which appeared more prominent than on the previous exam.  There is a second calcified lesion in the left lower quadrant measuring 2.6 x 1.1 cm, apparently in the sigmoid mesocolon, was also felt to be larger, measuring 10 mm thickness, previously 7 mm thickness.  Multiple soft tissue nodules in the central pelvis along the peritoneal reflection, similar to prior and suspicious for metastatic involvement.  Similar soft tissue fullness in the terminal ileum with some mild distension of ileum proximal to this level raising the question of underlying component of stenosis. Stable staghorn calculus lower pole left kidney without hydronephrosis. Tiny nonobstructing stones in the lower pole right kidney.     Oncology History  Malignant carcinoid tumor of unknown primary site [facility])  02/11/2020 Initial Diagnosis   Malignant carcinoid tumor of unknown primary site [facility])       INTERVAL HISTORY:   Glenn Bender is here today for repeat clinical assessment.  He continues octreotide  and vitamin B12 injections every 4 weeks.  He denies fevers or chills. He denies pain. His appetite is good. His weight {Weight change:10426}.  REVIEW OF SYSTEMS:   Review of Systems - Oncology   VITALS:   There were no  vitals taken for this visit.  Wt Readings from Last 3 Encounters:  09/10/23 256 lb 8 oz (116.3 kg)  08/13/23 255 lb 4.8 oz (115.8 kg)  07/16/23 253 lb 3.2 oz (114.9 kg)    There is no height or weight on file to calculate BMI.  Performance status (ECOG): {CHL ONC [id number]    PHYSICAL EXAM:   Physical Exam  LABS:      Latest Ref Rng & Units 08/13/2023    9:31 AM 07/16/2023    8:53 AM 06/18/2023    9:56 AM  CBC  WBC 4.0 - 10.5  K/uL 6.6  6.4  6.3   Hemoglobin 13.0 - 17.0 g/dL 84.7  84.7  84.0   Hematocrit 39.0 - 52.0 % 45.2  43.8  46.2   Platelets 150 - 400 K/uL 139  135  149       Latest Ref Rng & Units 08/13/2023    9:31 AM 07/16/2023    8:53 AM 06/18/2023    9:56 AM  CMP  Glucose 70 - 99 mg/dL 863  868  98   BUN 8 - 23 mg/dL 17  17  15    Creatinine 0.61 - 1.24 mg/dL 8.20  8.28  8.30   Sodium 135 - 145 mmol/L 139  141  141   Potassium 3.5 - 5.1 mmol/L 4.2  4.4  4.3   Chloride 98 - 111 mmol/L 108  107  109   CO2 22 - 32 mmol/L 21  22  24    Calcium 8.9 - 10.3 mg/dL 8.9  9.1  9.0   Total Protein 6.5 - 8.1 g/dL 6.5  6.3  6.5   Total Bilirubin 0.0 - 1.2 mg/dL 0.6  0.8  0.7   Alkaline Phos 38 - 126 U/L 56  61  59   AST 15 - 41 U/L 41  45  43   ALT 0 - 44 U/L 60  63  69      No results found for: CEA1, CEA / No results found for: CEA1, CEA No results found for: PSA1 No results found for: CAN199 No results found for: CAN125  No results found for: TOTALPROTELP, ALBUMINELP, A1GS, A2GS, BETS, BETA2SER, GAMS, MSPIKE, SPEI No results found for: TIBC, FERRITIN, IRONPCTSAT No results found for: LDH  STUDIES:   No results found.    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)    Malignant nonargentaffin carcinoid tumor (HCC)    Malignant nonargentaffin carcinoid tumor (HCC)    Secondary malignant carcinoid tumor of liver (HCC)    Thrombocytopenia (HCC) 03/24/2023    Past Surgical History:  Procedure Laterality Date   CYSTOTOMY  01/2020   IR URETERAL STENT LEFT NEW ACCESS W/O SEP NEPHROSTOMY CATH  06/13/2020   IR URETERAL STENT PLACEMENT EXISTING ACCESS LEFT  06/15/2020   LYMPH NODE BIOPSY Left    supraclavicular   NEPHROLITHOTOMY Left 06/13/2020   Procedure:  NEPHROLITHOTOMY PERCUTANEOUS;  Surgeon: Elisabeth Valli BIRCH, MD;  Location: [facility];  Service: Urology;  Laterality: Left;  2 HRS   ORCHIECTOMY  1996   UMBILICAL HERNIA REPAIR      Family History  Problem Relation Age of Onset   Cancer Mother  jaw bone   Throat cancer Father        smoker    Social History:  reports that he quit smoking about 28 years ago. His smoking use included cigarettes. He started smoking about 47 years ago. He has a 4.8 pack-year smoking history. He has never used smokeless tobacco. He reports current alcohol use. He reports that he does not use drugs.The patient is {Blank single:19197::alone,accompanied by} *** today.  Allergies: No Known Allergies  Current Medications: Current Outpatient Medications  Medication Sig Dispense Refill   celecoxib (CELEBREX) 200 MG capsule Take 200 mg by mouth 2 (two) times daily.     octreotide  (SANDOSTATIN  LAR) 20 MG injection Inject 20 mg into the muscle every 28 (twenty-eight) days.     No current facility-administered medications for this visit.

## 2023-11-04 NOTE — Assessment & Plan Note (Deleted)
 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  He has a terminal ileum mass measuring 4.0 x 2.8 x 3.7 cm, and a mass of the mesentery of the small intestine measuring 3.9 x 2.3 x 4.0 cm.  As this has already metastasized to the liver, surgical resection was not indicated.  24 hour urine for 5 HIAA was quite elevated at 86.8 consistent with carcinoid syndrome.  Chromogranin A was also elevated at 214. He started treatment with octreotide  injections every 4 weeks in October 2021.  Due to the persistent diarrhea, we increased the octreotide  to 30 mg every 4 weeks.  Over time, his disease has been stable to decreased on repeat CT imaging.  The chromogranin A, has remained elevated, but has not progressively increased.  Chromogranin A in March 2025 was 143.  Most recent CT imaging in April revealed numerous hepatic metastases again noted. Index lesions in the dome of the liver and left liver have decreased in size in the interval. Index lesion in the posterior right liver is stable to slightly decreased in size in the interval. Borderline lymphadenopathy in the central small bowel mesentery with irregular 3.9 x 3.5 cm calcified mesenteric mass in the mid abdomen, which appeared more prominent than on the previous exam.  There is a second calcified lesion in the left lower quadrant measuring 2.6 x 1.1 cm, apparently in the sigmoid mesocolon, was also felt to be larger, measuring 10 mm thickness, previously 7 mm thickness.  Multiple soft tissue nodules in the central pelvis along the peritoneal reflection, similar to prior and suspicious for metastatic involvement.  Similar soft tissue fullness in the terminal ileum with some mild distension of ileum proximal to this level raising the question of underlying component of stenosis. Stable staghorn calculus lower pole left kidney without hydronephrosis. Tiny nonobstructing stones in the lower pole right kidney.    He will proceed  with octreotide  today and continue octreotide  every 4 weeks.  Chromogranin A is pending from today.    We will plan to see him back in 12 weeks with a CBC, CMP, chromagranin A and CT abdomen and pelvis  to reassess his disease baseline.

## 2023-11-05 ENCOUNTER — Other Ambulatory Visit: Payer: Self-pay

## 2023-11-05 ENCOUNTER — Inpatient Hospital Stay

## 2023-11-05 ENCOUNTER — Encounter: Payer: Self-pay | Admitting: Hematology and Oncology

## 2023-11-05 ENCOUNTER — Inpatient Hospital Stay: Attending: Oncology

## 2023-11-05 ENCOUNTER — Ambulatory Visit: Admitting: Hematology and Oncology

## 2023-11-05 ENCOUNTER — Other Ambulatory Visit

## 2023-11-05 ENCOUNTER — Ambulatory Visit

## 2023-11-05 ENCOUNTER — Telehealth: Payer: Self-pay | Admitting: Hematology and Oncology

## 2023-11-05 ENCOUNTER — Inpatient Hospital Stay (HOSPITAL_BASED_OUTPATIENT_CLINIC_OR_DEPARTMENT_OTHER): Admitting: Hematology and Oncology

## 2023-11-05 ENCOUNTER — Inpatient Hospital Stay: Admitting: Hematology and Oncology

## 2023-11-05 VITALS — BP 143/77 | HR 57 | Temp 98.2°F | Resp 18 | Ht 70.0 in | Wt 258.2 lb

## 2023-11-05 DIAGNOSIS — C7A00 Malignant carcinoid tumor of unspecified site: Secondary | ICD-10-CM | POA: Insufficient documentation

## 2023-11-05 DIAGNOSIS — D696 Thrombocytopenia, unspecified: Secondary | ICD-10-CM

## 2023-11-05 DIAGNOSIS — C7B02 Secondary carcinoid tumors of liver: Secondary | ICD-10-CM | POA: Diagnosis not present

## 2023-11-05 DIAGNOSIS — D519 Vitamin B12 deficiency anemia, unspecified: Secondary | ICD-10-CM | POA: Diagnosis not present

## 2023-11-05 DIAGNOSIS — C7B8 Other secondary neuroendocrine tumors: Secondary | ICD-10-CM

## 2023-11-05 DIAGNOSIS — E538 Deficiency of other specified B group vitamins: Secondary | ICD-10-CM | POA: Insufficient documentation

## 2023-11-05 LAB — CMP (CANCER CENTER ONLY)
ALT: 58 U/L — ABNORMAL HIGH (ref 0–44)
AST: 34 U/L (ref 15–41)
Albumin: 4 g/dL (ref 3.5–5.0)
Alkaline Phosphatase: 60 U/L (ref 38–126)
Anion gap: 9 (ref 5–15)
BUN: 18 mg/dL (ref 8–23)
CO2: 21 mmol/L — ABNORMAL LOW (ref 22–32)
Calcium: 8.8 mg/dL — ABNORMAL LOW (ref 8.9–10.3)
Chloride: 109 mmol/L (ref 98–111)
Creatinine: 1.74 mg/dL — ABNORMAL HIGH (ref 0.61–1.24)
GFR, Estimated: 43 mL/min — ABNORMAL LOW (ref >60)
Glucose, Bld: 130 mg/dL — ABNORMAL HIGH (ref 70–99)
Potassium: 4.4 mmol/L (ref 3.5–5.1)
Sodium: 140 mmol/L (ref 135–145)
Total Bilirubin: 0.5 mg/dL (ref 0.0–1.2)
Total Protein: 6.2 g/dL — ABNORMAL LOW (ref 6.5–8.1)

## 2023-11-05 LAB — CBC WITH DIFFERENTIAL (CANCER CENTER ONLY)
Abs Immature Granulocytes: 0.02 K/uL (ref 0.00–0.07)
Basophils Absolute: 0.1 K/uL (ref 0.0–0.1)
Basophils Relative: 1 %
Eosinophils Absolute: 0.2 K/uL (ref 0.0–0.5)
Eosinophils Relative: 4 %
HCT: 42.1 % (ref 39.0–52.0)
Hemoglobin: 14.1 g/dL (ref 13.0–17.0)
Immature Granulocytes: 0 %
Immature Platelet Fraction: 2.7 % (ref 1.2–8.6)
Lymphocytes Relative: 16 %
Lymphs Abs: 0.9 K/uL (ref 0.7–4.0)
MCH: 30.3 pg (ref 26.0–34.0)
MCHC: 33.5 g/dL (ref 30.0–36.0)
MCV: 90.3 fL (ref 80.0–100.0)
Monocytes Absolute: 0.4 K/uL (ref 0.1–1.0)
Monocytes Relative: 7 %
Neutro Abs: 3.8 K/uL (ref 1.7–7.7)
Neutrophils Relative %: 72 %
Platelet Count: 111 K/uL — ABNORMAL LOW (ref 150–400)
RBC: 4.66 MIL/uL (ref 4.22–5.81)
RDW: 14.5 % (ref 11.5–15.5)
WBC Count: 5.3 K/uL (ref 4.0–10.5)
nRBC: 0 % (ref 0.0–0.2)

## 2023-11-05 LAB — FOLATE: Folate: 18 ng/mL (ref >5.9)

## 2023-11-05 MED ORDER — CYANOCOBALAMIN 1000 MCG/ML IJ SOLN
1000.0000 ug | Freq: Once | INTRAMUSCULAR | Status: AC
  Administered 2023-11-05: 1000 ug via INTRAMUSCULAR
  Filled 2023-11-05: qty 1

## 2023-11-05 MED ORDER — OCTREOTIDE ACETATE 30 MG IM KIT
30.0000 mg | PACK | Freq: Once | INTRAMUSCULAR | Status: AC
  Administered 2023-11-05: 30 mg via INTRAMUSCULAR
  Filled 2023-11-05: qty 1

## 2023-11-05 NOTE — Patient Instructions (Signed)
 Vitamin B12 Injection What is this medication? Vitamin B12 (VAHY tuh min B12) prevents and treats low vitamin B12 levels in your body. It is used in people who do not get enough vitamin B12 from their diet or when their digestive tract does not absorb enough. Vitamin B12 plays an important role in maintaining the health of your nervous system and red blood cells. This medicine may be used for other purposes; ask your health care provider or pharmacist if you have questions. COMMON BRAND NAME(S): B-12 Compliance Kit, B-12 Injection Kit, Cyomin, Dodex , LA-12, Nutri-Twelve, Physicians EZ Use B-12, Primabalt, Vitamin Deficiency Injectable System - B12 What should I tell my care team before I take this medication? They need to know if you have any of these conditions: Kidney disease Leber's disease Megaloblastic anemia An unusual or allergic reaction to cyanocobalamin , cobalt, other medications, foods, dyes, or preservatives Pregnant or trying to get pregnant Breast-feeding How should I use this medication? This medication is injected into a muscle or deeply under the skin. It is usually given in a clinic or care team's office. However, your care team may teach you how to inject yourself. Follow all instructions. Talk to your care team about the use of this medication in children. Special care may be needed. Overdosage: If you think you have taken too much of this medicine contact a poison control center or emergency room at once. NOTE: This medicine is only for you. Do not share this medicine with others. What if I miss a dose? If you are given your dose at a clinic or care team's office, call to reschedule your appointment. If you give your own injections, and you miss a dose, take it as soon as you can. If it is almost time for your next dose, take only that dose. Do not take double or extra doses. What may interact with this medication? Alcohol Colchicine This list may not describe all possible  interactions. Give your health care provider a list of all the medicines, herbs, non-prescription drugs, or dietary supplements you use. Also tell them if you smoke, drink alcohol, or use illegal drugs. Some items may interact with your medicine. What should I watch for while using this medication? Visit your care team regularly. You may need blood work done while you are taking this medication. You may need to follow a special diet. Talk to your care team. Limit your alcohol intake and avoid smoking to get the best benefit. What side effects may I notice from receiving this medication? Side effects that you should report to your care team as soon as possible: Allergic reactions--skin rash, itching, hives, swelling of the face, lips, tongue, or throat Swelling of the ankles, hands, or feet Trouble breathing Side effects that usually do not require medical attention (report to your care team if they continue or are bothersome): Diarrhea This list may not describe all possible side effects. Call your doctor for medical advice about side effects. You may report side effects to FDA at 1-800-FDA-1088. Where should I keep my medication? Keep out of the reach of children. Store at room temperature between 15 and 30 degrees C (59 and 85 degrees F). Protect from light. Throw away any unused medication after the expiration date. NOTE: This sheet is a summary. It may not cover all possible information. If you have questions about this medicine, talk to your doctor, pharmacist, or health care provider.  2024 Elsevier/Gold Standard (2020-12-26 00:00:00)Octreotide  Injection Suspension What is this medication? OCTREOTIDE  (ok  TREE oh tide) treats high levels of growth hormone (acromegaly). It works by reducing the amount of growth hormone your body makes. This reduces symptoms and the risk of health problems caused by too much growth hormone, such as diabetes and heart disease. It may also be used to treat  diarrhea caused by neuroendocrine tumors. It works by slowing down the release of serotonin from the tumor cells. This reduces the number of bowel movements you have. This medicine may be used for other purposes; ask your health care provider or pharmacist if you have questions. COMMON BRAND NAME(S): Sandostatin  LAR What should I tell my care team before I take this medication? They need to know if you have any of these conditions: Diabetes Gallbladder disease Heart disease Kidney disease Liver disease Pancreatic disease Thyroid disease An unusual or allergic reaction to octreotide , other medications, foods, dyes, or preservatives Pregnant or trying to get pregnant Breastfeeding How should I use this medication? This medication is injected into a muscle. It is usually given by your care team in a hospital or clinic setting. Talk to your care team about the use of this medication in children. Special care may be needed. Overdosage: If you think you have taken too much of this medicine contact a poison control center or emergency room at once. NOTE: This medicine is only for you. Do not share this medicine with others. What if I miss a dose? Keep appointments for follow-up doses. It is important not to miss your dose. Call your care team if you are unable to keep an appointment. What may interact with this medication? Do not take this medication with any of the following: Cisapride Dronedarone Flibanserin Lutetium Lu 177 dotatate Pimozide Saquinavir Thioridazine This medication may also interact with the following: Bromocriptine Certain medications for blood pressure, heart disease, irregular heartbeat Cyclosporine Diuretics Medications for diabetes, including insulin Quinidine This list may not describe all possible interactions. Give your health care provider a list of all the medicines, herbs, non-prescription drugs, or dietary supplements you use. Also tell them if you smoke,  drink alcohol, or use illegal drugs. Some items may interact with your medicine. What should I watch for while using this medication? Visit your care team for regular checks on your progress. Tell your care team if your symptoms do not start to get better or if they get worse. This medication may cause decreases in blood sugar. Signs of low blood sugar include chills, cool, pale skin or cold sweats, drowsiness, extreme hunger, fast heartbeat, headache, nausea, nervousness or anxiety, shakiness, trembling, unsteadiness, tiredness, or weakness. Contact your care team right away if you experience any of these symptoms. This medication may increase blood sugar. The risk may be higher in patients who already have diabetes. Ask your care team what you can do to lower your risk of diabetes while taking this medication. You should make sure you get enough vitamin B12 while you are taking this medication. Discuss the foods you eat and the vitamins you take with your care team. What side effects may I notice from receiving this medication? Side effects that you should report to your care team as soon as possible: Allergic reactions--skin rash, itching, hives, swelling of the face, lips, tongue, or throat Gallbladder problems--severe stomach pain, nausea, vomiting, fever Heart rhythm changes--fast or irregular heartbeat, dizziness, feeling faint or lightheaded, chest pain, trouble breathing High blood sugar (hyperglycemia)--increased thirst or amount of urine, unusual weakness or fatigue, blurry vision Low blood sugar (hypoglycemia)--tremors or shaking,  anxiety, sweating, cold or clammy skin, confusion, dizziness, rapid heartbeat Low thyroid levels (hypothyroidism)--unusual weakness or fatigue, increased sensitivity to cold, constipation, hair loss, dry skin, weight gain, feelings of depression Low vitamin B12 level--pain, tingling, or numbness in the hands or feet, muscle weakness, dizziness, confusion, trouble  concentrating Oily or light-colored stools, diarrhea, bloating, weight loss Pancreatitis--severe stomach pain that spreads to your back or gets worse after eating or when touched, fever, nausea, vomiting Slow heartbeat--dizziness, feeling faint or lightheaded, confusion, trouble breathing, unusual weakness or fatigue Side effects that usually do not require medical attention (report these to your care team if they continue or are bothersome): Diarrhea Dizziness Headache Nausea Pain, redness, or irritation at injection site Stomach pain This list may not describe all possible side effects. Call your doctor for medical advice about side effects. You may report side effects to FDA at 1-800-FDA-1088. Where should I keep my medication? This medication is given in a hospital or clinic. It will not be stored at home. NOTE: This sheet is a summary. It may not cover all possible information. If you have questions about this medicine, talk to your doctor, pharmacist, or health care provider.  2024 Elsevier/Gold Standard (2023-03-28 00:00:00)

## 2023-11-05 NOTE — Assessment & Plan Note (Addendum)
 Mild chronic thrombocytopenia, which has worsened. He continues B12 injections monthly. Folate in November 2024 was normal, but I will repeat folate today.

## 2023-11-05 NOTE — Telephone Encounter (Signed)
 Patient has been scheduled for follow-up visit per 11/05/23 LOS.  Pt given an appt calendar with date and time.

## 2023-11-05 NOTE — Assessment & Plan Note (Signed)
 B12 deficiency diagnosed in November 2024. He received a protracted course of B12 injections and continues B12 monthly.

## 2023-11-05 NOTE — Assessment & Plan Note (Signed)
 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  He has a terminal ileum mass measuring 4.0 x 2.8 x 3.7 cm, and a mass of the mesentery of the small intestine measuring 3.9 x 2.3 x 4.0 cm.  As this has already metastasized to the liver, surgical resection was not indicated.  24 hour urine for 5 HIAA was quite elevated at 86.8 consistent with carcinoid syndrome.  Chromogranin A was also elevated at 214. He started treatment with octreotide  injections every 4 weeks in October 2021.  Due to the persistent diarrhea, we increased the octreotide  to 30 mg every 4 weeks.  Over time, his disease has been stable to decreased on repeat CT imaging.  The chromogranin A, has remained elevated, but has not progressively increased.  Chromogranin A in March 2025 was 143.  Most recent CT imaging in April revealed numerous hepatic metastases again noted. Index lesions in the dome of the liver and left liver have decreased in size in the interval. Index lesion in the posterior right liver is stable to slightly decreased in size in the interval. Borderline lymphadenopathy in the central small bowel mesentery with irregular 3.9 x 3.5 cm calcified mesenteric mass in the mid abdomen, which appeared more prominent than on the previous exam.  There is a second calcified lesion in the left lower quadrant measuring 2.6 x 1.1 cm, apparently in the sigmoid mesocolon, was also felt to be larger, measuring 10 mm thickness, previously 7 mm thickness.  Multiple soft tissue nodules in the central pelvis along the peritoneal reflection, similar to prior and suspicious for metastatic involvement.  Similar soft tissue fullness in the terminal ileum with some mild distension of ileum proximal to this level raising the question of underlying component of stenosis. Stable staghorn calculus lower pole left kidney without hydronephrosis. Tiny nonobstructing stones in the lower pole right kidney.    He will proceed  with octreotide  today and continue octreotide  every 4 weeks.  Chromogranin A is pending from today.    We will plan to see him back in 12 weeks with a CBC, CMP, chromagranin A and CT abdomen and pelvis  to reassess his disease baseline.

## 2023-11-05 NOTE — Progress Notes (Signed)
 [facility]  [address] [place],  [place]  [address] [phone]  Clinic Day:  11/05/2023  Referring physician: Allen, [place], NP  ASSESSMENT & PLAN:   Assessment & Plan: Malignant carcinoid tumor of unknown primary site [facility]) Metastatic carcinoid tumor consistent with gastrointestinal origin in September 2021.  He has a terminal ileum mass measuring 4.0 x 2.8 x 3.7 cm, and a mass of the mesentery of the small intestine measuring 3.9 x 2.3 x 4.0 cm.  As this has already metastasized to the liver, surgical resection was not indicated.  24 hour urine for 5 HIAA was quite elevated at 86.8 consistent with carcinoid syndrome.  Chromogranin A was also elevated at 214. He started treatment with octreotide  injections every 4 weeks in October 2021.  Due to the persistent diarrhea, we increased the octreotide  to 30 mg every 4 weeks.  Over time, his disease has been stable to decreased on repeat CT imaging.  The chromogranin A, has remained elevated, but has not progressively increased.  Chromogranin A in March 2025 was 143.  Most recent CT imaging in April revealed numerous hepatic metastases again noted. Index lesions in the dome of the liver and left liver have decreased in size in the interval. Index lesion in the posterior right liver is stable to slightly decreased in size in the interval. Borderline lymphadenopathy in the central small bowel mesentery with irregular 3.9 x 3.5 cm calcified mesenteric mass in the mid abdomen, which appeared more prominent than on the previous exam.  There is a second calcified lesion in the left lower quadrant measuring 2.6 x 1.1 cm, apparently in the sigmoid mesocolon, was also felt to be larger, measuring 10 mm thickness, previously 7 mm thickness.  Multiple soft tissue nodules in the central pelvis along the peritoneal reflection, similar to prior and suspicious for metastatic involvement.  Similar soft tissue fullness in the terminal ileum with some  mild distension of ileum proximal to this level raising the question of underlying component of stenosis. Stable staghorn calculus lower pole left kidney without hydronephrosis. Tiny nonobstructing stones in the lower pole right kidney.    He will proceed with octreotide  today and continue octreotide  every 4 weeks.  Chromogranin A is pending from today.   We will plan to see him back in 12 weeks with a CBC, CMP, chromagranin A and CT abdomen and pelvis  to reassess his disease baseline.  Secondary neuroendocrine tumor of liver (HCC) Multiple liver masses, the largest measuring 4.0 x 4.3 cm, consistent with metastatic carcinoid with carcinoid syndrome.  His disease remains stable to improved on CT imaging in April.  He will continue octreotide  every 4 weeks.  B12 deficiency anemia B12 deficiency diagnosed in November 2024. He received a protracted course of B12 injections and continues B12 monthly.  Thrombocytopenia (HCC) Mild chronic thrombocytopenia, which has worsened. He continues B12 injections monthly. Folate in November 2024 was normal, but I will repeat folate today.    The patient understands the plans discussed today and is in agreement with them.  He knows to contact our office if he develops concerns prior to his next appointment.   I provided 20 minutes of face-to-face time during this encounter and > 50% was spent counseling as documented under my assessment and plan.    Cheyeanne Roadcap A Garima Chronis, PA-C  [facility] A DEPT OF [facility] [address] [place] [address] Dept: [phone] Dept  Fax: [phone]   Orders Placed This Encounter  Procedures   CT ABDOMEN PELVIS W WO CONTRAST    Standing Status:   Future    Expected Date:   01/26/2024    Expiration Date:   11/04/2024    If indicated for the ordered procedure, I authorize the administration of contrast media per Radiology protocol:   Yes    Does the patient have a  contrast media/X-ray dye allergy?:   No    Preferred imaging location?:   [facility]    If indicated for the ordered procedure, I authorize the administration of oral contrast media per Radiology protocol:   Yes   Folate    Standing Status:   Future    Number of Occurrences:   1    Expected Date:   11/05/2023    Expiration Date:   02/03/2024      CHIEF COMPLAINT:  CC: Malignant carcinoid  Current Treatment: Octreotide  every 4 weeks  HISTORY OF PRESENT ILLNESS:  Glenn Bender is a 63 y.o. male with metastatic carcinoid to liver diagnosed in September 2021.  The patient presented with hematuria, felt to be due to a kidney stone.  CT abdomen and pelvis revealed multiple liver masses, the largest measuring 4.0 x 4.3 cm, a terminal ileum mass measuring 4.0 x 2.8 x 3.7 cm, and a mass of the mesentery of the small intestine measuring 3.9 x 2.3 x 4.0 cm.  Diagnostic colonoscopy with Dr. Allana in September revealed a large mucosal covered mass protruding through the ileocecal valve with peristalsis.  Biopsy was collected and surgical pathology was benign.  Liver biopsy revealed metastatic neuroendocrine tumor, grade 2, consistent with gastrointestinal primary. Synaptophysin and chromogranin-A immunostains were positive as well as CDX-2 supporting gastrointestinal origin.  Ki67 was 6%.  He reports flushing of his face with alcohol and certain foods.  He also reported diarrhea.  24 hour urine for 5HIAA was also elevated at 86.8, which is consistent with carcinoid.  Chromogranin A was elevated at 214 in October and came down to 116.9 in December.  CT chest from November 2021 revealed left paratracheal adenopathy and pleural nodularity along the right hemidiaphragm measuring up to 12 mm, worrisome for metastatic disease. As he had metastatic disease, resection of his primary was not recommended. The patient was started on octreotide  20 mg IM monthly in October.  Octreotide  was subsequently increased to 30  mg monthly in December due to persistent diarrhea.   CT chest, abdomen and pelvis in February 2022 revealed stable to slightly decreased appearance of the carcinoid tumor, including the mesenteric lesion.  The liver lesions were stable with no new lesions. There was stable nodularity/lobularity along the right hemidiaphragm.  He has had a continued decrease in the Chromogranin A, which was 111.4 in February, and then 100.3 in March, which is within normal limits.  He had improvement in his diarrhea with the increase in octreotide . The chromogranin A was up slightly in May to 126. CT chest, abdomen and pelvis in August revealed no new or progressive metastatic disease.  Findings are suggestive of multifocal small bowel carcinoid with conglomerate mesenteric nodal metastases.  Widespread hyperenhancing liver metastases are stable.  Stable clustered subpleural solid pulmonary nodules along the right hemidiaphragm in the basilar right lower lobe are compatible with pulmonary metastases.  There is new background diffuse hepatic steatosis. Chromogranin A was down to 107.5 in August.  The chromogranin A was 118 in November.   CT abdomen and pelvis in  April 2023 revealed stable hepatic metastatic disease and stable hepatic steatosis.  There was a stable 3.6 cm partially calcified mesenteric mass, consistent with carcinoid tumor.  No evidence of new or progressive metastatic disease.  Bilateral nephrolithiasis, a small left inguinal hernia and aortic atherosclerosis were also seen.  He was hospitalized in September 2023 at [facility] with a small bowel obstruction.  CT abdomen and pelvis at that time revealed small-bowel obstruction, with transition point in the mid to distal jejunum adjacent to the known mesenteric carcinoid tumor. There was a stable calcified central mesenteric mass consistent with carcinoid tumor, and numerous liver masses, consistent with metastatic carcinoid tumor. The index lesions are not appreciably  changed since prior exam. Hepatic steatosis, a non-obstructing left renal calculus and aortic atherosclerosis were seen.  We have continued octreotide  30 mg every 4 weeks with good control of his diarrhea.  He had a follow-up x-ray for his left kidney stone in December, which was stable.     CT abdomen and pelvis in April 2024 revealed no significant change in the calcified retractile mass in the base of the mesentery, typical of carcinoid. No gross change in the previously demonstrated hepatic metastases, although evaluation is limited by the lack of intravenous contrast and interval improvement in background hepatic steatosis. Interval resolution of previously demonstrated small bowel distension. No evidence of progressive metastatic disease. Nonobstructing left renal calculi.  Aortic Atherosclerosis.  He reported passing kidney stone in June.  The chromogranin A has been fairly stable over the past year, running around 145, with a max of 176.  CT abdomen and pelvis in October 2024 revealed stable calcified mesenteric mass and shotty subcentimeter mesenteric lymph nodes, as well as diffuse hepatic metastasis and hepatic steatosis without significant change.  No new or progressive metastatic disease was seen within the abdomen or pelvis.  Chromogranin A was 143 in March 2025.  CT abdomen and pelvis in April revealed numerous hepatic metastases again noted. Index lesions in the dome of the liver and left liver have decreased in size in the interval. Index lesion in the posterior right liver is stable to slightly decreased in size in the interval. Borderline lymphadenopathy in the central small bowel mesentery with irregular 3.9 x 3.5 cm calcified mesenteric mass in the mid abdomen, which appeared more prominent than on the previous exam.  There is a second calcified lesion in the left lower quadrant measuring 2.6 x 1.1 cm, apparently in the sigmoid mesocolon, was also felt to be larger, measuring 10 mm  thickness, previously 7 mm thickness.  Multiple soft tissue nodules in the central pelvis along the peritoneal reflection, similar to prior and suspicious for metastatic involvement.  Similar soft tissue fullness in the terminal ileum with some mild distension of ileum proximal to this level raising the question of underlying component of stenosis. Stable staghorn calculus lower pole left kidney without hydronephrosis. Tiny nonobstructing stones in the lower pole right kidney.     Oncology History  Malignant carcinoid tumor of unknown primary site [facility])  02/11/2020 Initial Diagnosis   Malignant carcinoid tumor of unknown primary site [facility])       INTERVAL HISTORY:   Rocky is here today for repeat clinical assessment.  He continues octreotide  and vitamin B12 injections every 4 weeks.  He continues to do well and denies complaints.  He denies fevers or chills. He denies pain. His appetite is good. His weight has increased 3 pounds over last 3 months.  He reports microscopic hematuria per  urology. He states he is having lithotripsy again later this month for the left kidney stone.   REVIEW OF SYSTEMS:   Review of Systems  Constitutional:  Negative for appetite change, chills, fatigue, fever and unexpected weight change.  HENT:   Negative for lump/mass, mouth sores, nosebleeds and sore throat.   Respiratory:  Negative for cough, hemoptysis and shortness of breath.   Cardiovascular:  Negative for chest pain and leg swelling.  Gastrointestinal:  Negative for abdominal pain, blood in stool, constipation, diarrhea, nausea and vomiting.  Genitourinary:  Positive for hematuria (microscopic). Negative for difficulty urinating, dysuria and frequency.   Musculoskeletal:  Negative for arthralgias, back pain and myalgias.  Skin:  Negative for itching, rash and wound.  Neurological:  Negative for dizziness, extremity weakness, headaches, light-headedness and numbness.  Hematological:  Negative for  adenopathy. Does not bruise/bleed easily.  Psychiatric/Behavioral:  Negative for depression and sleep disturbance. The patient is not nervous/anxious.      VITALS:   Blood pressure (!) 143/77, pulse (!) 57, temperature 98.2 F (36.8 C), temperature source Oral, resp. rate 18, height 5' 10 (1.778 m), weight 258 lb 3.2 oz (117.1 kg), SpO2 97%.  Wt Readings from Last 3 Encounters:  11/05/23 258 lb 3.2 oz (117.1 kg)  09/10/23 256 lb 8 oz (116.3 kg)  08/13/23 255 lb 4.8 oz (115.8 kg)    Body mass index is 37.05 kg/m.  Performance status (ECOG): 0 - Asymptomatic    PHYSICAL EXAM:   Physical Exam Vitals and nursing note reviewed.  Constitutional:      General: He is not in acute distress.    Appearance: Normal appearance. He is normal weight.  HENT:     Head: Normocephalic and atraumatic.     Mouth/Throat:     Mouth: Mucous membranes are moist.     Pharynx: Oropharynx is clear. No oropharyngeal exudate or posterior oropharyngeal erythema.  Eyes:     General: No scleral icterus.    Extraocular Movements: Extraocular movements intact.     Conjunctiva/sclera: Conjunctivae normal.     Pupils: Pupils are equal, round, and reactive to light.  Cardiovascular:     Rate and Rhythm: Normal rate and regular rhythm.     Heart sounds: Normal heart sounds. No murmur heard.    No friction rub. No gallop.  Pulmonary:     Effort: Pulmonary effort is normal.     Breath sounds: Normal breath sounds. No wheezing, rhonchi or rales.  Abdominal:     General: Bowel sounds are normal. There is no distension.     Palpations: Abdomen is soft. There is no hepatomegaly, splenomegaly or mass.     Tenderness: There is no abdominal tenderness.  Musculoskeletal:        General: Normal range of motion.     Cervical back: Normal range of motion and neck supple. No tenderness.     Right lower leg: No edema.     Left lower leg: No edema.  Lymphadenopathy:     Cervical: No cervical adenopathy.     Upper  Body:     Right upper body: No supraclavicular or axillary adenopathy.     Left upper body: No supraclavicular or axillary adenopathy.     Lower Body: No right inguinal adenopathy. No left inguinal adenopathy.  Skin:    General: Skin is warm and dry.     Coloration: Skin is not jaundiced.     Findings: No rash.  Neurological:     Mental Status:  He is alert and oriented to person, place, and time.     Cranial Nerves: No cranial nerve deficit.  Psychiatric:        Mood and Affect: Mood normal.        Behavior: Behavior normal.        Thought Content: Thought content normal.     LABS:      Latest Ref Rng & Units 11/05/2023    1:02 PM 08/13/2023    9:31 AM 07/16/2023    8:53 AM  CBC  WBC 4.0 - 10.5 K/uL 5.3  6.6  6.4   Hemoglobin 13.0 - 17.0 g/dL 85.8  84.7  84.7   Hematocrit 39.0 - 52.0 % 42.1  45.2  43.8   Platelets 150 - 400 K/uL 111  139  135       Latest Ref Rng & Units 11/05/2023    1:02 PM 08/13/2023    9:31 AM 07/16/2023    8:53 AM  CMP  Glucose 70 - 99 mg/dL 869  863  868   BUN 8 - 23 mg/dL 18  17  17    Creatinine 0.61 - 1.24 mg/dL 8.25  8.20  8.28   Sodium 135 - 145 mmol/L 140  139  141   Potassium 3.5 - 5.1 mmol/L 4.4  4.2  4.4   Chloride 98 - 111 mmol/L 109  108  107   CO2 22 - 32 mmol/L 21  21  22    Calcium 8.9 - 10.3 mg/dL 8.8  8.9  9.1   Total Protein 6.5 - 8.1 g/dL 6.2  6.5  6.3   Total Bilirubin 0.0 - 1.2 mg/dL 0.5  0.6  0.8   Alkaline Phos 38 - 126 U/L 60  56  61   AST 15 - 41 U/L 34  41  45   ALT 0 - 44 U/L 58  60  63      No results found for: CEA1, CEA / No results found for: CEA1, CEA No results found for: PSA1 No results found for: CAN199 No results found for: CAN125  No results found for: TOTALPROTELP, ALBUMINELP, A1GS, A2GS, BETS, BETA2SER, GAMS, MSPIKE, SPEI No results found for: TIBC, FERRITIN, IRONPCTSAT No results found for: LDH  STUDIES:   No results found.    HISTORY:   Past Medical History:   Diagnosis Date   Arthritis    Rt foot   History of COVID-19 2020   hospitalized for 1 month   History of kidney stones    History of testicular cancer 1996   Malignant carcinoid tumor of the ileum (HCC)    Malignant nonargentaffin carcinoid tumor (HCC)    Malignant nonargentaffin carcinoid tumor (HCC)    Secondary malignant carcinoid tumor of liver (HCC)    Thrombocytopenia (HCC) 03/24/2023    Past Surgical History:  Procedure Laterality Date   CYSTOTOMY  01/2020   IR URETERAL STENT LEFT NEW ACCESS W/O SEP NEPHROSTOMY CATH  06/13/2020   IR URETERAL STENT PLACEMENT EXISTING ACCESS LEFT  06/15/2020   LYMPH NODE BIOPSY Left    supraclavicular   NEPHROLITHOTOMY Left 06/13/2020   Procedure: NEPHROLITHOTOMY PERCUTANEOUS;  Surgeon: Elisabeth Valli BIRCH, MD;  Location: [facility];  Service: Urology;  Laterality: Left;  2 HRS   ORCHIECTOMY  1996   UMBILICAL HERNIA REPAIR      Family History  Problem Relation Age of Onset   Cancer Mother        jaw bone   Throat cancer  Father        smoker    Social History:  reports that he quit smoking about 28 years ago. His smoking use included cigarettes. He started smoking about 47 years ago. He has a 4.8 pack-year smoking history. He has never used smokeless tobacco. He reports current alcohol use. He reports that he does not use drugs.The patient is alone today.  Allergies: No Known Allergies  Current Medications: Current Outpatient Medications  Medication Sig Dispense Refill   celecoxib (CELEBREX) 200 MG capsule Take 200 mg by mouth 2 (two) times daily.     octreotide  (SANDOSTATIN  LAR) 20 MG injection Inject 20 mg into the muscle every 28 (twenty-eight) days.     No current facility-administered medications for this visit.

## 2023-11-05 NOTE — Assessment & Plan Note (Signed)
 Multiple liver masses, the largest measuring 4.0 x 4.3 cm, consistent with metastatic carcinoid with carcinoid syndrome.  His disease remains stable to improved on CT imaging in April.  He will continue octreotide  every 4 weeks.

## 2023-11-07 ENCOUNTER — Telehealth: Payer: Self-pay

## 2023-11-07 LAB — CHROMOGRANIN A: Chromogranin A (ng/mL): 139.2 ng/mL — ABNORMAL HIGH (ref 0.0–101.8)

## 2023-11-07 NOTE — Telephone Encounter (Signed)
-----   Message from Andrez DELENA Foy sent at 11/06/2023  6:43 PM EDT ----- His folic acid  is normal. Chromagranin A is pending. Please review the results with Dr. Brendan and let patient know. Thanks

## 2023-11-07 NOTE — Telephone Encounter (Signed)
 Message left folic acid  is normal , other lab pending will call when we have results.

## 2023-12-03 ENCOUNTER — Inpatient Hospital Stay: Attending: Oncology

## 2023-12-03 VITALS — BP 148/69 | HR 64 | Temp 98.1°F | Resp 18

## 2023-12-03 DIAGNOSIS — C7A00 Malignant carcinoid tumor of unspecified site: Secondary | ICD-10-CM | POA: Diagnosis not present

## 2023-12-03 DIAGNOSIS — E538 Deficiency of other specified B group vitamins: Secondary | ICD-10-CM | POA: Diagnosis not present

## 2023-12-03 DIAGNOSIS — C7B8 Other secondary neuroendocrine tumors: Secondary | ICD-10-CM

## 2023-12-03 DIAGNOSIS — C7B02 Secondary carcinoid tumors of liver: Secondary | ICD-10-CM | POA: Diagnosis not present

## 2023-12-03 MED ORDER — CYANOCOBALAMIN 1000 MCG/ML IJ SOLN
1000.0000 ug | Freq: Once | INTRAMUSCULAR | Status: AC
  Administered 2023-12-03: 1000 ug via INTRAMUSCULAR
  Filled 2023-12-03: qty 1

## 2023-12-03 MED ORDER — OCTREOTIDE ACETATE 30 MG IM KIT
30.0000 mg | PACK | Freq: Once | INTRAMUSCULAR | Status: AC
  Administered 2023-12-03: 30 mg via INTRAMUSCULAR
  Filled 2023-12-03: qty 1

## 2023-12-03 NOTE — Patient Instructions (Signed)
 Vitamin B12 Deficiency Vitamin B12 deficiency means that your body does not have enough vitamin B12. The body needs this important vitamin: To make red blood cells. To make genes (DNA). To help the nerves work. If you do not have enough vitamin B12 in your body, you can have health problems, such as not having enough red blood cells in the blood (anemia). What are the causes? Not eating enough foods that contain vitamin B12. Not being able to take in (absorb) vitamin B12 from the food that you eat. Certain diseases. A condition in which the body does not make enough of a certain protein. This results in your body not taking in enough vitamin B12. Having a surgery in which part of the stomach or small intestine is taken out. Taking medicines that make it hard for the body to take in vitamin B12. These include: Heartburn medicines. Some medicines that are used to treat diabetes. What increases the risk? Being an older adult. Eating a vegetarian or vegan diet that does not include any foods that come from animals. Not eating enough foods that contain vitamin B12 while you are pregnant. Taking certain medicines. Having alcoholism. What are the signs or symptoms? In some cases, there are no symptoms. If the condition leads to too few blood cells or nerve damage, symptoms can occur, such as: Feeling weak or tired. Not being hungry. Losing feeling (numbness) or tingling in your hands and feet. Redness and burning of the tongue. Feeling sad (depressed). Confusion or memory problems. Trouble walking. If anemia is very bad, symptoms can include: Being short of breath. Being dizzy. Having a very fast heartbeat. How is this treated? Changing the way you eat and drink, such as: Eating more foods that contain vitamin B12. Drinking little or no alcohol. Getting vitamin B12 shots. Taking vitamin B12 supplements by mouth (orally). Your doctor will tell you the dose that is best for you. Follow  these instructions at home: Eating and drinking  Eat foods that come from animals and have a lot of vitamin B12 in them. These include: Meats and poultry. This includes beef, pork, chicken, [place], and organ meats, such as liver. Seafood, such as clams, rainbow trout, salmon, tuna, and haddock. Eggs. Dairy foods such as milk, yogurt, and cheese. Eat breakfast cereals that have vitamin B12 added to them (are fortified). Check the label. The items listed above may not be a complete list of foods and beverages you can eat and drink. Contact a dietitian for more information. Alcohol use Do not drink alcohol if: Your doctor tells you not to drink. You are pregnant, may be pregnant, or are planning to become pregnant. If you drink alcohol: Limit how much you have to: 0-1 drink a day for women. 0-2 drinks a day for men. Know how much alcohol is in your drink. In the [place] one drink equals one 12 oz bottle of beer (355 mL), one 5 oz glass of wine (148 mL), or one 1 oz glass of hard liquor (44 mL). General instructions Get any vitamin B12 shots if told by your doctor. Take supplements only as told by your doctor. Follow the directions. Keep all follow-up visits. Contact a doctor if: Your symptoms come back. Your symptoms get worse or do not get better with treatment. Get help right away if: You have trouble breathing. You have a very fast heartbeat. You have chest pain. You get dizzy. You faint. These symptoms may be an emergency. Get help right away. Call 911.  Do not wait to see if the symptoms will go away. Do not drive yourself to the hospital. Summary Vitamin B12 deficiency means that your body is not getting enough of the vitamin. In some cases, there are no symptoms of this condition. Treatment may include making a change in the way you eat and drink, getting shots, or taking supplements. Eat foods that have vitamin B12 in them. This information is not intended to replace advice  given to you by your health care provider. Make sure you discuss any questions you have with your health care provider. Document Revised: 12/08/2020 Document Reviewed: 12/08/2020 Elsevier Patient Education  2024 [employer]Octreotide  Injection Solution What is this medication? OCTREOTIDE  (ok TREE oh tide) treats high levels of growth hormone (acromegaly). It works by reducing the amount of growth hormone your body makes. This reduces symptoms and the risk of health problems caused by too much growth hormone, such as diabetes and heart disease. It may also be used to treat diarrhea caused by neuroendocrine tumors. It works by slowing down the release of serotonin from the tumor cells. This reduces the number of bowel movements you have. This medicine may be used for other purposes; ask your health care provider or pharmacist if you have questions. COMMON BRAND NAME(S): Bynfezia , Sandostatin  What should I tell my care team before I take this medication? They need to know if you have any of these conditions: Diabetes Gallbladder disease Heart disease Kidney disease Liver disease Pancreatic disease Thyroid disease An unusual or allergic reaction to octreotide , other medications, foods, dyes, or preservatives Pregnant or trying to get pregnant Breastfeeding How should I use this medication? This medication is injected under the skin or into a vein. It is usually given by your care team in a hospital or clinic setting. If you get this medication at home, you will be taught how to prepare and give it. Use exactly as directed. Take it as directed on the prescription label at the same time every day. Keep taking it unless your care team tells you to stop. Allow the injection solution to come to room temperature before use. Do not warm it artificially. It is important that you put your used needles and syringes in a special sharps container. Do not put them in a trash can. If you do not have a sharps  container, call your pharmacist or care team to get one. Talk to your care team about the use of this medication in children. Special care may be needed. Overdosage: If you think you have taken too much of this medicine contact a poison control center or emergency room at once. NOTE: This medicine is only for you. Do not share this medicine with others. What if I miss a dose? If you miss a dose, take it as soon as you can. If it is almost time for your next dose, take only that dose. Do not take double or extra doses. What may interact with this medication? Bromocriptine Certain medications for blood pressure, heart disease, irregular heartbeat Cyclosporine Diuretics Medications for diabetes, including insulin Quinidine This list may not describe all possible interactions. Give your health care provider a list of all the medicines, herbs, non-prescription drugs, or dietary supplements you use. Also tell them if you smoke, drink alcohol, or use illegal drugs. Some items may interact with your medicine. What should I watch for while using this medication? Visit your care team for regular checks on your progress. Tell your care team if your symptoms  do not start to get better or if they get worse. To help reduce irritation at the injection site, use a different site for each injection and make sure the solution is at room temperature before use. This medication may cause decreases in blood sugar. Signs of low blood sugar include chills, cool, pale skin or cold sweats, drowsiness, extreme hunger, fast heartbeat, headache, nausea, nervousness or anxiety, shakiness, trembling, unsteadiness, tiredness, or weakness. Contact your care team right away if you experience any of these symptoms. This medication may increase blood sugar. The risk may be higher in patients who already have diabetes. Ask your care team what you can do to lower your risk of diabetes while taking this medication. You should make  sure you get enough vitamin B12 while you are taking this medication. Discuss the foods you eat and the vitamins you take with your care team. What side effects may I notice from receiving this medication? Side effects that you should report to your care team as soon as possible: Allergic reactions--skin rash, itching, hives, swelling of the face, lips, tongue, or throat Gallbladder problems--severe stomach pain, nausea, vomiting, fever Heart rhythm changes--fast or irregular heartbeat, dizziness, feeling faint or lightheaded, chest pain, trouble breathing High blood sugar (hyperglycemia)--increased thirst or amount of urine, unusual weakness or fatigue, blurry vision Low blood sugar (hypoglycemia)--tremors or shaking, anxiety, sweating, cold or clammy skin, confusion, dizziness, rapid heartbeat Low thyroid levels (hypothyroidism)--unusual weakness or fatigue, increased sensitivity to cold, constipation, hair loss, dry skin, weight gain, feelings of depression Low vitamin B12 level--pain, tingling, or numbness in the hands or feet, muscle weakness, dizziness, confusion, trouble concentrating Oily or light-colored stools, diarrhea, bloating, weight loss Pancreatitis--severe stomach pain that spreads to your back or gets worse after eating or when touched, fever, nausea, vomiting Slow heartbeat--dizziness, feeling faint or lightheaded, confusion, trouble breathing, unusual weakness or fatigue Side effects that usually do not require medical attention (report these to your care team if they continue or are bothersome): Diarrhea Dizziness Headache Nausea Pain, redness, or irritation at injection site Stomach pain This list may not describe all possible side effects. Call your doctor for medical advice about side effects. You may report side effects to FDA at 1-800-FDA-1088. Where should I keep my medication? Keep out of the reach of children and pets. Store in the refrigerator. Protect from light.  Allow to come to room temperature naturally. Do not use artificial heat. If protected from light, the injection may be stored between 20 and 30 degrees C (70 and 86 degrees F) for 14 days. After the initial use, throw away any unused portion of a multiple dose vial after 14 days. Get rid of any unused portions of the ampules after use. To get rid of medications that are no longer needed or have expired: Take the medication to a medication take-back program. Ask your pharmacy or law enforcement to find a location. If you cannot return the medication, ask your pharmacist or care team how to get rid of the medication safely. NOTE: This sheet is a summary. It may not cover all possible information. If you have questions about this medicine, talk to your doctor, pharmacist, or health care provider.  2024 Elsevier/Gold Standard (2023-03-28 00:00:00)

## 2023-12-05 DIAGNOSIS — N401 Enlarged prostate with lower urinary tract symptoms: Secondary | ICD-10-CM | POA: Diagnosis not present

## 2023-12-05 DIAGNOSIS — N200 Calculus of kidney: Secondary | ICD-10-CM | POA: Diagnosis not present

## 2023-12-31 ENCOUNTER — Inpatient Hospital Stay

## 2024-01-02 ENCOUNTER — Inpatient Hospital Stay: Attending: Oncology

## 2024-01-02 VITALS — BP 143/82 | HR 55 | Temp 98.2°F | Resp 18

## 2024-01-02 DIAGNOSIS — C7A00 Malignant carcinoid tumor of unspecified site: Secondary | ICD-10-CM | POA: Diagnosis not present

## 2024-01-02 DIAGNOSIS — C7B8 Other secondary neuroendocrine tumors: Secondary | ICD-10-CM

## 2024-01-02 DIAGNOSIS — C7B02 Secondary carcinoid tumors of liver: Secondary | ICD-10-CM | POA: Diagnosis not present

## 2024-01-02 DIAGNOSIS — E538 Deficiency of other specified B group vitamins: Secondary | ICD-10-CM | POA: Diagnosis not present

## 2024-01-02 MED ORDER — CYANOCOBALAMIN 1000 MCG/ML IJ SOLN
1000.0000 ug | Freq: Once | INTRAMUSCULAR | Status: AC
  Administered 2024-01-02: 1000 ug via INTRAMUSCULAR
  Filled 2024-01-02: qty 1

## 2024-01-02 MED ORDER — OCTREOTIDE ACETATE 30 MG IM KIT
30.0000 mg | PACK | Freq: Once | INTRAMUSCULAR | Status: AC
  Administered 2024-01-02: 30 mg via INTRAMUSCULAR
  Filled 2024-01-02: qty 1

## 2024-01-02 NOTE — Patient Instructions (Signed)
 Octreotide  Injection Suspension What is this medication? OCTREOTIDE  (ok TREE oh tide) treats high levels of growth hormone (acromegaly). It works by reducing the amount of growth hormone your body makes. This reduces symptoms and the risk of health problems caused by too much growth hormone, such as diabetes and heart disease. It may also be used to treat diarrhea caused by neuroendocrine tumors. It works by slowing down the release of serotonin from the tumor cells. This reduces the number of bowel movements you have. This medicine may be used for other purposes; ask your health care provider or pharmacist if you have questions. COMMON BRAND NAME(S): Sandostatin  LAR What should I tell my care team before I take this medication? They need to know if you have any of these conditions: Diabetes Gallbladder disease Heart disease Kidney disease Liver disease Pancreatic disease Thyroid disease An unusual or allergic reaction to octreotide , other medications, foods, dyes, or preservatives Pregnant or trying to get pregnant Breastfeeding How should I use this medication? This medication is injected into a muscle. It is usually given by your care team in a hospital or clinic setting. Talk to your care team about the use of this medication in children. Special care may be needed. Overdosage: If you think you have taken too much of this medicine contact a poison control center or emergency room at once. NOTE: This medicine is only for you. Do not share this medicine with others. What if I miss a dose? Keep appointments for follow-up doses. It is important not to miss your dose. Call your care team if you are unable to keep an appointment. What may interact with this medication? Do not take this medication with any of the following: Cisapride Dronedarone Flibanserin Lutetium Lu 177 dotatate Pimozide Saquinavir Thioridazine This medication may also interact with the  following: Bromocriptine Certain medications for blood pressure, heart disease, irregular heartbeat Cyclosporine Diuretics Medications for diabetes, including insulin Quinidine This list may not describe all possible interactions. Give your health care provider a list of all the medicines, herbs, non-prescription drugs, or dietary supplements you use. Also tell them if you smoke, drink alcohol, or use illegal drugs. Some items may interact with your medicine. What should I watch for while using this medication? Visit your care team for regular checks on your progress. Tell your care team if your symptoms do not start to get better or if they get worse. This medication may cause decreases in blood sugar. Signs of low blood sugar include chills, cool, pale skin or cold sweats, drowsiness, extreme hunger, fast heartbeat, headache, nausea, nervousness or anxiety, shakiness, trembling, unsteadiness, tiredness, or weakness. Contact your care team right away if you experience any of these symptoms. This medication may increase blood sugar. The risk may be higher in patients who already have diabetes. Ask your care team what you can do to lower your risk of diabetes while taking this medication. You should make sure you get enough vitamin B12 while you are taking this medication. Discuss the foods you eat and the vitamins you take with your care team. What side effects may I notice from receiving this medication? Side effects that you should report to your care team as soon as possible: Allergic reactions--skin rash, itching, hives, swelling of the face, lips, tongue, or throat Gallbladder problems--severe stomach pain, nausea, vomiting, fever Heart rhythm changes--fast or irregular heartbeat, dizziness, feeling faint or lightheaded, chest pain, trouble breathing High blood sugar (hyperglycemia)--increased thirst or amount of urine, unusual weakness or fatigue,  blurry vision Low blood sugar  (hypoglycemia)--tremors or shaking, anxiety, sweating, cold or clammy skin, confusion, dizziness, rapid heartbeat Low thyroid levels (hypothyroidism)--unusual weakness or fatigue, increased sensitivity to cold, constipation, hair loss, dry skin, weight gain, feelings of depression Low vitamin B12 level--pain, tingling, or numbness in the hands or feet, muscle weakness, dizziness, confusion, trouble concentrating Oily or light-colored stools, diarrhea, bloating, weight loss Pancreatitis--severe stomach pain that spreads to your back or gets worse after eating or when touched, fever, nausea, vomiting Slow heartbeat--dizziness, feeling faint or lightheaded, confusion, trouble breathing, unusual weakness or fatigue Side effects that usually do not require medical attention (report these to your care team if they continue or are bothersome): Diarrhea Dizziness Headache Nausea Pain, redness, or irritation at injection site Stomach pain This list may not describe all possible side effects. Call your doctor for medical advice about side effects. You may report side effects to FDA at 1-800-FDA-1088. Where should I keep my medication? This medication is given in a hospital or clinic. It will not be stored at home. NOTE: This sheet is a summary. It may not cover all possible information. If you have questions about this medicine, talk to your doctor, pharmacist, or health care provider.  2024 Elsevier/Gold Standard (2023-03-28 00:00:00)Vitamin B12 Injection What is this medication? Vitamin B12 (VAHY tuh min B12) prevents and treats low vitamin B12 levels in your body. It is used in people who do not get enough vitamin B12 from their diet or when their digestive tract does not absorb enough. Vitamin B12 plays an important role in maintaining the health of your nervous system and red blood cells. This medicine may be used for other purposes; ask your health care provider or pharmacist if you have  questions. COMMON BRAND NAME(S): B-12 Compliance Kit, B-12 Injection Kit, Cyomin, Dodex , LA-12, Nutri-Twelve, Physicians EZ Use B-12, Primabalt, Vitamin Deficiency Injectable System - B12 What should I tell my care team before I take this medication? They need to know if you have any of these conditions: Kidney disease Leber's disease Megaloblastic anemia An unusual or allergic reaction to cyanocobalamin , cobalt, other medications, foods, dyes, or preservatives Pregnant or trying to get pregnant Breast-feeding How should I use this medication? This medication is injected into a muscle or deeply under the skin. It is usually given in a clinic or care team's office. However, your care team may teach you how to inject yourself. Follow all instructions. Talk to your care team about the use of this medication in children. Special care may be needed. Overdosage: If you think you have taken too much of this medicine contact a poison control center or emergency room at once. NOTE: This medicine is only for you. Do not share this medicine with others. What if I miss a dose? If you are given your dose at a clinic or care team's office, call to reschedule your appointment. If you give your own injections, and you miss a dose, take it as soon as you can. If it is almost time for your next dose, take only that dose. Do not take double or extra doses. What may interact with this medication? Alcohol Colchicine This list may not describe all possible interactions. Give your health care provider a list of all the medicines, herbs, non-prescription drugs, or dietary supplements you use. Also tell them if you smoke, drink alcohol, or use illegal drugs. Some items may interact with your medicine. What should I watch for while using this medication? Visit your care team  regularly. You may need blood work done while you are taking this medication. You may need to follow a special diet. Talk to your care team. Limit  your alcohol intake and avoid smoking to get the best benefit. What side effects may I notice from receiving this medication? Side effects that you should report to your care team as soon as possible: Allergic reactions--skin rash, itching, hives, swelling of the face, lips, tongue, or throat Swelling of the ankles, hands, or feet Trouble breathing Side effects that usually do not require medical attention (report to your care team if they continue or are bothersome): Diarrhea This list may not describe all possible side effects. Call your doctor for medical advice about side effects. You may report side effects to FDA at 1-800-FDA-1088. Where should I keep my medication? Keep out of the reach of children. Store at room temperature between 15 and 30 degrees C (59 and 85 degrees F). Protect from light. Throw away any unused medication after the expiration date. NOTE: This sheet is a summary. It may not cover all possible information. If you have questions about this medicine, talk to your doctor, pharmacist, or health care provider.  2024 Elsevier/Gold Standard (2020-12-26 00:00:00)

## 2024-01-22 ENCOUNTER — Inpatient Hospital Stay

## 2024-01-22 ENCOUNTER — Other Ambulatory Visit: Payer: Self-pay | Admitting: Hematology and Oncology

## 2024-01-22 ENCOUNTER — Ambulatory Visit (INDEPENDENT_AMBULATORY_CARE_PROVIDER_SITE_OTHER)
Admission: RE | Admit: 2024-01-22 | Discharge: 2024-01-22 | Disposition: A | Discharge status: Home / Self Care | Source: Ambulatory Visit | Attending: Hematology and Oncology | Admitting: Hematology and Oncology

## 2024-01-22 DIAGNOSIS — C481 Malignant neoplasm of specified parts of peritoneum: Secondary | ICD-10-CM | POA: Diagnosis not present

## 2024-01-22 DIAGNOSIS — C7A00 Malignant carcinoid tumor of unspecified site: Secondary | ICD-10-CM

## 2024-01-22 DIAGNOSIS — C787 Secondary malignant neoplasm of liver and intrahepatic bile duct: Secondary | ICD-10-CM | POA: Diagnosis not present

## 2024-01-22 DIAGNOSIS — C7B8 Other secondary neuroendocrine tumors: Secondary | ICD-10-CM

## 2024-01-22 DIAGNOSIS — D696 Thrombocytopenia, unspecified: Secondary | ICD-10-CM

## 2024-01-22 DIAGNOSIS — D519 Vitamin B12 deficiency anemia, unspecified: Secondary | ICD-10-CM

## 2024-01-22 DIAGNOSIS — C7A098 Malignant carcinoid tumors of other sites: Secondary | ICD-10-CM | POA: Diagnosis not present

## 2024-01-22 LAB — CBC WITH DIFFERENTIAL (CANCER CENTER ONLY)
Abs Immature Granulocytes: 0.03 K/uL (ref 0.00–0.07)
Basophils Absolute: 0.1 K/uL (ref 0.0–0.1)
Basophils Relative: 1 %
Eosinophils Absolute: 0.1 K/uL (ref 0.0–0.5)
Eosinophils Relative: 2 %
HCT: 41.2 % (ref 39.0–52.0)
Hemoglobin: 14.1 g/dL (ref 13.0–17.0)
Immature Granulocytes: 1 %
Lymphocytes Relative: 13 %
Lymphs Abs: 0.7 K/uL (ref 0.7–4.0)
MCH: 30.5 pg (ref 26.0–34.0)
MCHC: 34.2 g/dL (ref 30.0–36.0)
MCV: 89.2 fL (ref 80.0–100.0)
Monocytes Absolute: 0.4 K/uL (ref 0.1–1.0)
Monocytes Relative: 6 %
Neutro Abs: 4.5 K/uL (ref 1.7–7.7)
Neutrophils Relative %: 77 %
Platelet Count: 126 K/uL — ABNORMAL LOW (ref 150–400)
RBC: 4.62 MIL/uL (ref 4.22–5.81)
RDW: 13.9 % (ref 11.5–15.5)
WBC Count: 5.8 K/uL (ref 4.0–10.5)
nRBC: 0 % (ref 0.0–0.2)

## 2024-01-22 LAB — CMP (CANCER CENTER ONLY)
ALT: 41 U/L (ref 0–44)
AST: 33 U/L (ref 15–41)
Albumin: 4.1 g/dL (ref 3.5–5.0)
Alkaline Phosphatase: 57 U/L (ref 38–126)
Anion gap: 12 (ref 5–15)
BUN: 27 mg/dL — ABNORMAL HIGH (ref 8–23)
CO2: 20 mmol/L — ABNORMAL LOW (ref 22–32)
Calcium: 8.4 mg/dL — ABNORMAL LOW (ref 8.9–10.3)
Chloride: 108 mmol/L (ref 98–111)
Creatinine: 2.27 mg/dL — ABNORMAL HIGH (ref 0.61–1.24)
GFR, Estimated: 31 mL/min — ABNORMAL LOW (ref >60)
Glucose, Bld: 121 mg/dL — ABNORMAL HIGH (ref 70–99)
Potassium: 4.6 mmol/L (ref 3.5–5.1)
Sodium: 140 mmol/L (ref 135–145)
Total Bilirubin: 0.7 mg/dL (ref 0.0–1.2)
Total Protein: 6.3 g/dL — ABNORMAL LOW (ref 6.5–8.1)

## 2024-01-22 MED ORDER — IOPAMIDOL (ISOVUE-300) INJECTION 61%: 100.0000 mL | Freq: Once | INTRAVENOUS | Status: DC | PRN

## 2024-01-22 MED ORDER — IOHEXOL 300 MG/ML  SOLN
100.0000 mL | Freq: Once | INTRAMUSCULAR | Status: AC | PRN
Start: 2024-01-22 — End: 2024-01-22
  Administered 2024-01-22: 80 mL via INTRAVENOUS

## 2024-01-28 ENCOUNTER — Inpatient Hospital Stay: Attending: Oncology | Admitting: Oncology

## 2024-01-28 ENCOUNTER — Telehealth: Payer: Self-pay | Admitting: Oncology

## 2024-01-28 ENCOUNTER — Encounter: Payer: Self-pay | Admitting: Oncology

## 2024-01-28 ENCOUNTER — Inpatient Hospital Stay

## 2024-01-28 VITALS — BP 132/79 | HR 59 | Temp 98.1°F | Resp 16 | Ht 70.0 in | Wt 255.6 lb

## 2024-01-28 DIAGNOSIS — C7A00 Malignant carcinoid tumor of unspecified site: Secondary | ICD-10-CM | POA: Diagnosis not present

## 2024-01-28 DIAGNOSIS — C7B8 Other secondary neuroendocrine tumors: Secondary | ICD-10-CM

## 2024-01-28 DIAGNOSIS — C7B02 Secondary carcinoid tumors of liver: Secondary | ICD-10-CM | POA: Diagnosis not present

## 2024-01-28 DIAGNOSIS — Z23 Encounter for immunization: Secondary | ICD-10-CM | POA: Insufficient documentation

## 2024-01-28 DIAGNOSIS — E538 Deficiency of other specified B group vitamins: Secondary | ICD-10-CM | POA: Insufficient documentation

## 2024-01-28 MED ORDER — OCTREOTIDE ACETATE 30 MG IM KIT
30.0000 mg | PACK | Freq: Once | INTRAMUSCULAR | Status: AC
  Administered 2024-01-28: 30 mg via INTRAMUSCULAR
  Filled 2024-01-28: qty 1

## 2024-01-28 MED ORDER — INFLUENZA VAC SPLIT HIGH-DOSE 0.5 ML IM SUSY
0.5000 mL | PREFILLED_SYRINGE | Freq: Once | INTRAMUSCULAR | Status: AC
  Administered 2024-01-28: 0.5 mL via INTRAMUSCULAR
  Filled 2024-01-28: qty 0.5

## 2024-01-28 MED ORDER — CYANOCOBALAMIN 1000 MCG/ML IJ SOLN
1000.0000 ug | Freq: Once | INTRAMUSCULAR | Status: AC
  Administered 2024-01-28: 1000 ug via INTRAMUSCULAR
  Filled 2024-01-28: qty 1

## 2024-01-28 NOTE — Progress Notes (Signed)
 [facility]  [address] [place],  [place]  [address] [phone]  Clinic Day: 01/28/2024  Referring physician: Allen, [place], NP  ASSESSMENT & PLAN:  Assessment: Malignant carcinoid tumor of unknown primary site [facility]) Metastatic carcinoid tumor consistent with gastrointestinal origin in September 2021.  He has a terminal ileum mass measuring 4.0 x 2.8 x 3.7 cm, and a mass of the mesentery of the small intestine measuring 3.9 x 2.3 x 4.0 cm.  As this has already metastasized to the liver, surgical resection was not indicated.  24 hour urine for 5 HIAA was quite elevated at 86.8 consistent with carcinoid syndrome.  Chromogranin A was also elevated at 214. He started treatment with octreotide  injections every 4 weeks in October 2021.  Due to the persistent diarrhea, we increased the octreotide  to 30 mg every 4 weeks.  Over time, his disease has been stable to decreased on repeat CT imaging.  The chromogranin A, has remained elevated, but has not progressively increased.  Chromogranin A in March 2025 was 143.2, and it continues to slowly decrease to 139.2 in July, 2025.    CT abdomen and pelvis done on 01/22/2024 that revealed the numerous hepatic metastases are stable, two calcified mesenteric masses  consistent with his known carcinoid tumor. There are peripheral mesenteric lymph nodes and spiculation consistent with fibrotic reactions surrounding the larger mass and an adjacent small bowel loop that is slightly dilated at the same region in the right mid abdomen possibly secondary to mild stenotic changes along the mass. There was no discrete bowel obstruction, multiple peritoneal nodules along the peritoneal reflections of the pelvic cavity, and a short-segment focal thickening of terminal ileum with questionable hypodense mural lesion. He has done very well for 4 years with stable scans and minimal symptoms. We will continue with the current therapy of monthly Sandostatin .     Secondary  neuroendocrine tumor of liver (HCC) Multiple liver masses, the largest measuring 4.0 x 4.3 cm, consistent with metastatic carcinoid with carcinoid syndrome.  His disease remains stable to improved on CT imaging in April.  He will continue octreotide  every 4 weeks.   B12 deficiency anemia B12 deficiency diagnosed in November 2024. He received a protracted course of B12 injections and continues B12 monthly.   Thrombocytopenia (HCC) Mild chronic thrombocytopenia, which had worsened. He continues B12 injections monthly. Folate in November 2024 was normal and repeat in July, 2025 remains normal. His latest platelet count was up to 126,000.    Plan: He had a CT abdomen and pelvis done on 01/22/2024 that revealed the numerous hepatic metastases are stable, and two calcified mesenteric masses  consistent with his known carcinoid tumor. There are peripheral mesenteric lymph nodes and spiculation consistent with fibrotic reactions surrounding the larger mass and an adjacent small bowel loop that is slightly dilated at the same region in the right mid abdomen possibly secondary to mild stenotic changes along the mass. There was no discrete bowel obstruction, multiple peritoneal nodules along the peritoneal reflections of the pelvic cavity, and a short-segment focal thickening of terminal ileum with questionable hypodense mural lesion. He had labs done on the 25th of September and had a WBC of 5.8, hemoglobin of 14.1, and low platelet count of 126,000 improved from 111,000. He has a elevated creatinine of 2.27 with a BUN of 27 and low calcium of 8.4 and total protein of 6.3. I encouraged him to increase his fluids. He will continue his Sandostatin  injection every 4 weeks and I  will see him back in 12 weeks with CBC, CMP, and chromogranin A. I think we can wait 1 year to repeat the scans unless he has worsening symptoms or labs. The patient understands the plans discussed today and is in agreement with them.  He knows  to contact our office if he develops concerns prior to his next appointment.   I provided 20 minutes of face-to-face time during this encounter and > 50% was spent counseling as documented under my assessment and plan.   No orders of the defined types were placed in this encounter.  Wanda VEAR Cornish, MD  [facility] OF [facility] [address] [place] [address] Dept: [phone] Dept Fax: [phone]    CHIEF COMPLAINT:  CC: Malignant carcinoid  Current Treatment: Octreotide  every 4 weeks  HISTORY OF PRESENT ILLNESS:  Jhan Conery is a 63 y.o. male with metastatic carcinoid to liver diagnosed in September 2021.  The patient presented with hematuria, felt to be due to a kidney stone.  CT abdomen and pelvis revealed multiple liver masses, the largest measuring 4.0 x 4.3 cm, a terminal ileum mass measuring 4.0 x 2.8 x 3.7 cm, and a mass of the mesentery of the small intestine measuring 3.9 x 2.3 x 4.0 cm.  Diagnostic colonoscopy with Dr. Allana in September revealed a large mucosal covered mass protruding through the ileocecal valve with peristalsis.  Biopsy was collected and surgical pathology was benign.  Liver biopsy revealed metastatic neuroendocrine tumor, grade 2, consistent with gastrointestinal primary. Synaptophysin and chromogranin-A immunostains were positive as well as CDX-2 supporting gastrointestinal origin.  Ki67 was 6%.  He reports flushing of his face with alcohol and certain foods.  He also reported diarrhea.  24 hour urine for 5HIAA was also elevated at 86.8, which is consistent with carcinoid.  Chromogranin A was elevated at 214 in October and came down to 116.9 in December.  CT chest from November 2021 revealed left paratracheal adenopathy and pleural nodularity along the right hemidiaphragm measuring up to 12 mm, worrisome for metastatic disease. As he had metastatic disease, resection of his primary  was not recommended. The patient was started on octreotide  20 mg IM monthly in October.  Octreotide  was subsequently increased to 30 mg monthly in December due to persistent diarrhea.   CT chest, abdomen and pelvis in February 2022 revealed stable to slightly decreased appearance of the carcinoid tumor, including the mesenteric lesion.  The liver lesions were stable with no new lesions. There was stable nodularity/lobularity along the right hemidiaphragm.  He has had a continued decrease in the Chromogranin A, which was 111.4 in February, and then 100.3 in March, which is within normal limits.  He had improvement in his diarrhea with the increase in octreotide . The chromogranin A was up slightly in May to 126. CT chest, abdomen and pelvis in August revealed no new or progressive metastatic disease.  Findings are suggestive of multifocal small bowel carcinoid with conglomerate mesenteric nodal metastases.  Widespread hyperenhancing liver metastases are stable.  Stable clustered subpleural solid pulmonary nodules along the right hemidiaphragm in the basilar right lower lobe are compatible with pulmonary metastases.  There is new background diffuse hepatic steatosis. Chromogranin A was down to 107.5 in August.  The chromogranin A was 118 in November.   CT abdomen and pelvis in April 2023 revealed stable hepatic metastatic disease and stable hepatic steatosis.  There was a stable 3.6 cm  partially calcified mesenteric mass, consistent with carcinoid tumor.  No evidence of new or progressive metastatic disease.  Bilateral nephrolithiasis, a small left inguinal hernia and aortic atherosclerosis were also seen.  He was hospitalized in September 2023 at [facility] with a small bowel obstruction.  CT abdomen and pelvis at that time revealed small-bowel obstruction, with transition point in the mid to distal jejunum adjacent to the known mesenteric carcinoid tumor. There was a stable calcified central mesenteric mass  consistent with carcinoid tumor, and numerous liver masses, consistent with metastatic carcinoid tumor. The index lesions are not appreciably changed since prior exam. Hepatic steatosis, a non-obstructing left renal calculus and aortic atherosclerosis were seen.  We have continued octreotide  30 mg every 4 weeks with good control of his diarrhea.  He had a follow-up x-ray for his left kidney stone in December, which was stable.     CT abdomen and pelvis in April 2024 revealed no significant change in the calcified retractile mass in the base of the mesentery, typical of carcinoid. No gross change in the previously demonstrated hepatic metastases, although evaluation is limited by the lack of intravenous contrast and interval improvement in background hepatic steatosis. Interval resolution of previously demonstrated small bowel distension. No evidence of progressive metastatic disease. Nonobstructing left renal calculi.  Aortic Atherosclerosis.  He reported passing kidney stone in June.  The chromogranin A has been fairly stable over the past year, running around 145, with a max of 176.  CT abdomen and pelvis in October 2024 revealed stable calcified mesenteric mass and shotty subcentimeter mesenteric lymph nodes, as well as diffuse hepatic metastasis and hepatic steatosis without significant change.  No new or progressive metastatic disease was seen within the abdomen or pelvis.  Chromogranin A was 143 in March 2025.  CT abdomen and pelvis in April revealed numerous hepatic metastases again noted. Index lesions in the dome of the liver and left liver have decreased in size in the interval. Index lesion in the posterior right liver is stable to slightly decreased in size in the interval. Borderline lymphadenopathy in the central small bowel mesentery with irregular 3.9 x 3.5 cm calcified mesenteric mass in the mid abdomen, which appeared more prominent than on the previous exam.  There is a second calcified lesion  in the left lower quadrant measuring 2.6 x 1.1 cm, apparently in the sigmoid mesocolon, was also felt to be larger, measuring 10 mm thickness, previously 7 mm thickness.  Multiple soft tissue nodules in the central pelvis along the peritoneal reflection, similar to prior and suspicious for metastatic involvement.  Similar soft tissue fullness in the terminal ileum with some mild distension of ileum proximal to this level raising the question of underlying component of stenosis. Stable staghorn calculus lower pole left kidney without hydronephrosis. Tiny nonobstructing stones in the lower pole right kidney.     Oncology History  Malignant carcinoid tumor of unknown primary site [facility])  02/11/2020 Initial Diagnosis   Malignant carcinoid tumor of unknown primary site [facility])     INTERVAL HISTORY:  Nikos is here today for repeat clinical assessment for her malignant carcinoid. Patient states that he feels well and has no complaints of pain. He notes having flushing a couple times a month but it is tolerable. He does have at least 2 bowel movements daily. He had a CT abdomen and pelvis done on 01/22/2024 that revealed the numerous hepatic metastases are stable, and two calcified mesenteric masses  consistent with his known carcinoid tumor. There are  peripheral mesenteric lymph nodes and spiculation consistent with fibrotic reactions surrounding the larger mass and an adjacent small bowel loop that is slightly dilated at the same region in the right mid abdomen possibly secondary to mild stenotic changes along the mass. There was no discrete bowel obstruction, multiple peritoneal nodules along the peritoneal reflections of the pelvic cavity, and a short-segment focal thickening of terminal ileum with questionable hypodense mural lesion. He had labs done on the 25th of September and had a WBC of 5.8, hemoglobin of 14.1, and low platelet count of 126,000 improved from 111,000. He has a elevated creatinine of 2.27  with a BUN of 27 and low calcium of 8.4 and total protein of 6.3. I encouraged him to increase his fluids. He will continue his Sandostatin  injection every 4 weeks and I will see him back in 12 weeks with CBC and CMP. I think we can wait 1 year to repeat the scans unless he has worsening symptoms or labs.   He denies fever, chills, night sweats, or other signs of infection. He denies cardiorespiratory and gastrointestinal issues. He  denies pain. His appetite is very good and His weight has decreased 3 pounds over last 3 months.   REVIEW OF SYSTEMS:   Review of Systems  Constitutional: Negative.  Negative for appetite change, chills, diaphoresis, fatigue, fever and unexpected weight change.  HENT:  Negative.  Negative for hearing loss, lump/mass, mouth sores, nosebleeds, sore throat, tinnitus, trouble swallowing and voice change.   Eyes: Negative.   Respiratory: Negative.  Negative for chest tightness, cough, hemoptysis, shortness of breath and wheezing.   Cardiovascular: Negative.  Negative for chest pain, leg swelling and palpitations.  Gastrointestinal: Negative.  Negative for abdominal distention, abdominal pain, blood in stool, constipation, diarrhea, nausea, rectal pain and vomiting.  Endocrine: Negative.  Negative for hot flashes.  Genitourinary:  Negative for bladder incontinence, difficulty urinating, dyspareunia, dysuria, frequency, hematuria, nocturia, pelvic pain and penile discharge.   Musculoskeletal:  Negative for arthralgias, back pain, flank pain, gait problem, myalgias, neck pain and neck stiffness.  Skin: Negative.  Negative for itching, rash and wound.  Neurological: Negative.  Negative for dizziness, extremity weakness, gait problem, headaches, light-headedness, numbness, seizures and speech difficulty.  Hematological: Negative.  Negative for adenopathy. Does not bruise/bleed easily.  Psychiatric/Behavioral: Negative.  Negative for confusion, decreased concentration,  depression, sleep disturbance and suicidal ideas. The patient is not nervous/anxious.      VITALS:  Blood pressure 132/79, pulse (!) 59, temperature 98.1 F (36.7 C), temperature source Oral, resp. rate 16, height 5' 10 (1.778 m), weight 255 lb 9.6 oz (115.9 kg), SpO2 98%.  Wt Readings from Last 3 Encounters:  01/28/24 255 lb 9.6 oz (115.9 kg)  11/05/23 258 lb 3.2 oz (117.1 kg)  09/10/23 256 lb 8 oz (116.3 kg)    Body mass index is 36.67 kg/m.  Performance status (ECOG): 0 - Asymptomatic  PHYSICAL EXAM:   Physical Exam Vitals and nursing note reviewed.  Constitutional:      General: He is not in acute distress.    Appearance: Normal appearance. He is normal weight. He is not ill-appearing, toxic-appearing or diaphoretic.  HENT:     Head: Normocephalic and atraumatic.     Right Ear: Tympanic membrane, ear canal and external ear normal. There is no impacted cerumen.     Left Ear: Tympanic membrane, ear canal and external ear normal. There is no impacted cerumen.     Nose: Nose normal. No congestion or  rhinorrhea.     Mouth/Throat:     Mouth: Mucous membranes are moist.     Pharynx: Oropharynx is clear. No oropharyngeal exudate or posterior oropharyngeal erythema.  Eyes:     General: No scleral icterus.       Right eye: No discharge.        Left eye: No discharge.     Extraocular Movements: Extraocular movements intact.     Conjunctiva/sclera: Conjunctivae normal.     Pupils: Pupils are equal, round, and reactive to light.  Neck:     Vascular: No carotid bruit.  Cardiovascular:     Rate and Rhythm: Normal rate and regular rhythm.     Pulses: Normal pulses.     Heart sounds: Normal heart sounds. No murmur heard.    No friction rub. No gallop.  Pulmonary:     Effort: Pulmonary effort is normal. No respiratory distress.     Breath sounds: Normal breath sounds. No stridor. No wheezing, rhonchi or rales.  Chest:     Chest wall: No tenderness.  Abdominal:     General: Bowel  sounds are normal. There is no distension.     Palpations: Abdomen is soft. There is no hepatomegaly, splenomegaly or mass.     Tenderness: There is no abdominal tenderness. There is no right CVA tenderness, left CVA tenderness, guarding or rebound.     Hernia: No hernia is present.     Comments: Bottom of his liver is just below the right costal margin  Musculoskeletal:        General: No swelling, tenderness, deformity or signs of injury. Normal range of motion.     Cervical back: Normal range of motion and neck supple. No rigidity or tenderness.     Right lower leg: No edema.     Left lower leg: No edema.  Lymphadenopathy:     Cervical: No cervical adenopathy.     Upper Body:     Right upper body: No supraclavicular or axillary adenopathy.     Left upper body: No supraclavicular or axillary adenopathy.     Lower Body: No right inguinal adenopathy. No left inguinal adenopathy.  Skin:    General: Skin is warm and dry.     Coloration: Skin is not jaundiced or pale.     Findings: No bruising, erythema, lesion or rash.  Neurological:     General: No focal deficit present.     Mental Status: He is alert and oriented to person, place, and time. Mental status is at baseline.     Cranial Nerves: No cranial nerve deficit.     Sensory: No sensory deficit.     Motor: No weakness.     Coordination: Coordination normal.     Gait: Gait normal.     Deep Tendon Reflexes: Reflexes normal.  Psychiatric:        Mood and Affect: Mood normal.        Behavior: Behavior normal.        Thought Content: Thought content normal.        Judgment: Judgment normal.     LABS:      Latest Ref Rng & Units 01/22/2024    1:12 PM 11/05/2023    1:02 PM 08/13/2023    9:31 AM  CBC  WBC 4.0 - 10.5 K/uL 5.8  5.3  6.6   Hemoglobin 13.0 - 17.0 g/dL 85.8  85.8  84.7   Hematocrit 39.0 - 52.0 % 41.2  42.1  45.2  Platelets 150 - 400 K/uL 126  111  139       Latest Ref Rng & Units 01/22/2024    1:12 PM 11/05/2023     1:02 PM 08/13/2023    9:31 AM  CMP  Glucose 70 - 99 mg/dL 878  869  863   BUN 8 - 23 mg/dL 27  18  17    Creatinine 0.61 - 1.24 mg/dL 7.72  8.25  8.20   Sodium 135 - 145 mmol/L 140  140  139   Potassium 3.5 - 5.1 mmol/L 4.6  4.4  4.2   Chloride 98 - 111 mmol/L 108  109  108   CO2 22 - 32 mmol/L 20  21  21    Calcium 8.9 - 10.3 mg/dL 8.4  8.8  8.9   Total Protein 6.5 - 8.1 g/dL 6.3  6.2  6.5   Total Bilirubin 0.0 - 1.2 mg/dL 0.7  0.5  0.6   Alkaline Phos 38 - 126 U/L 57  60  56   AST 15 - 41 U/L 33  34  41   ALT 0 - 44 U/L 41  58  60      No results found for: CEA1, CEA / No results found for: CEA1, CEA No results found for: PSA1 No results found for: CAN199 No results found for: CAN125  No results found for: TOTALPROTELP, ALBUMINELP, A1GS, A2GS, BETS, BETA2SER, GAMS, MSPIKE, SPEI No results found for: TIBC, FERRITIN, IRONPCTSAT No results found for: LDH  STUDIES:   CT ABDOMEN PELVIS W CONTRAST Result Date: 01/23/2024 CLINICAL DATA:  Metastatic carcinoid tumor to the liver., history of testicular cancer status post orchiectomy. EXAM: CT ABDOMEN AND PELVIS WITH CONTRAST TECHNIQUE: Multidetector CT imaging of the abdomen and pelvis was performed using the standard protocol following bolus administration of intravenous contrast. RADIATION DOSE REDUCTION: This exam was performed according to the departmental dose-optimization program which includes automated exposure control, adjustment of the mA and/or kV according to patient size and/or use of iterative reconstruction technique. CONTRAST:  80mL OMNIPAQUE  IOHEXOL  300 MG/ML  SOLN COMPARISON:  CT abdomen pelvis August 07, 2023. : Lower chest: No suspicious pulmonary nodule within the field of view. Hepatobiliary: Hepatomegaly and hepatic steatosis. Again seen there are numerous hepatic metastasis some demonstrating ring and the remainder demonstrate diffuse homogeneous enhancement, stable to prior. Index  lesions in left lobe measured up to 4.4 cm. Gallbladder is unremarkable. No biliary dilatation. No portal vein thrombosis. Pancreas: No focal mass lesion. No dilatation of the main duct. No intraparenchymal cyst. No peripancreatic edema. Spleen: Splenomegaly.  No suspicious focal mass lesion. Adrenals/Urinary Tract: No adrenal nodule or mass. Bilateral nonobstructive nephrolithiasis similar to prior. No hydronephrosis. Urinary bladder is normal for distension. Stomach/Bowel: Small hiatal hernia. Stomach otherwise unremarkable. Again seen there is focal thickening, distension of terminal ileum with associated few diverticula similar to prior. Question of hypodense component along the wall measuring 1 cm (301/67). A small bowel loop is dilated up to 3.6 cm adjacent to the known central mesenteric malignancy possibly partially obstructive due to fibrotic reaction from the carcinoid tumor (601/140). No definite bowel obstruction identified The appendix is normal. No gross colonic mass. No colonic wall thickening. Scattered colonic diverticula without diverticulitis. Vascular/Lymphatic: There is mild atherosclerotic calcification of the abdominal aorta without aneurysm. There is diffuse Misty mesentery surrounding the mesenteric carcinoid tumor. The mass measures 3.4 x 4.4 cm containing coarse calcification, previously measured 3.9 x 3.5 cm. There is a second calcified lesion  in left lower quadrant along the rectosigmoid mesocolon measuring 2.6 x 1 cm, previously measured 2.6 x 1.1 cm. Multiple nodules are present along the peritoneal reflections of the pelvic cavity consistent with peritoneal implants, grossly stable to prior. No free fluid. Reproductive: The prostate gland and seminal vesicles are unremarkable. Other: Fat containing left inguinal hernia. Musculoskeletal: No suspicious osseous lesion to suggest metastatic disease. IMPRESSION: 1. Numerous hepatic metastases are stable to prior. 2. Two calcified  mesenteric masses as detailed above consistent with known carcinoid tumor. Peripheral Misty mesentery and spiculation consistent with fibrotic reactions surrounding the larger mass. An adjacent small bowel loop is slightly dilated at the same region in right mid abdomen possibly secondary to mild stenotic changes along the mass. No discrete bowel obstruction. 3. Multiple peritoneal nodules along the peritoneal reflections of the pelvic cavity, consistent with metastatic implants stable to prior. 4. Short-segment focal thickening of terminal ileum with questionable hypodense mural lesion. Findings may represent inflammatory changes (such as Crohn's disease) versus malignancy. Electronically Signed   By: Megan  Zare M.D.   On: 01/23/2024 20:39      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)    Malignant nonargentaffin carcinoid tumor (HCC)    Malignant nonargentaffin carcinoid tumor (HCC)    Secondary malignant carcinoid tumor of liver (HCC)    Thrombocytopenia 03/24/2023    Past Surgical History:  Procedure Laterality Date   CYSTOTOMY  01/2020   IR URETERAL STENT LEFT NEW ACCESS W/O SEP NEPHROSTOMY CATH  06/13/2020   IR URETERAL STENT PLACEMENT EXISTING ACCESS LEFT  06/15/2020   LYMPH NODE BIOPSY Left    supraclavicular   NEPHROLITHOTOMY Left 06/13/2020   Procedure: NEPHROLITHOTOMY PERCUTANEOUS;  Surgeon: Elisabeth Valli BIRCH, MD;  Location: [facility];  Service: Urology;  Laterality: Left;  2 HRS   ORCHIECTOMY  1996   UMBILICAL HERNIA REPAIR      Family History  Problem Relation Age of Onset   Cancer Mother        jaw bone   Throat cancer Father        smoker    Social History:  reports that he quit smoking about 28 years ago. His smoking use included cigarettes. He started smoking about 47  years ago. He has a 4.8 pack-year smoking history. He has never used smokeless tobacco. He reports current alcohol use. He reports that he does not use drugs.The patient is alone today.  Allergies: No Known Allergies  Current Medications: Current Outpatient Medications  Medication Sig Dispense Refill   celecoxib (CELEBREX) 200 MG capsule Take 200 mg by mouth 2 (two) times daily.     octreotide  (SANDOSTATIN  LAR) 20 MG injection Inject 20 mg into the muscle every 28 (twenty-eight) days.     No current facility-administered medications for this visit.    I,Jasmine M Lassiter,acting as a scribe for Wanda VEAR Cornish, MD.,have documented all relevant documentation on the behalf of Wanda VEAR Cornish, MD,as directed by  Wanda VEAR Cornish, MD while in the presence of Wanda VEAR Cornish, MD.

## 2024-01-28 NOTE — Patient Instructions (Signed)
 Vitamin B12 Injection What is this medication? Vitamin B12 (VAHY tuh min B12) prevents and treats low vitamin B12 levels in your body. It is used in people who do not get enough vitamin B12 from their diet or when their digestive tract does not absorb enough. Vitamin B12 plays an important role in maintaining the health of your nervous system and red blood cells. This medicine may be used for other purposes; ask your health care provider or pharmacist if you have questions. COMMON BRAND NAME(S): B-12 Compliance Kit, B-12 Injection Kit, Cyomin, Dodex , LA-12, Nutri-Twelve, Physicians EZ Use B-12, Primabalt, Vitamin Deficiency Injectable System - B12 What should I tell my care team before I take this medication? They need to know if you have any of these conditions: Kidney disease Leber's disease Megaloblastic anemia An unusual or allergic reaction to cyanocobalamin , cobalt, other medications, foods, dyes, or preservatives Pregnant or trying to get pregnant Breast-feeding How should I use this medication? This medication is injected into a muscle or deeply under the skin. It is usually given in a clinic or care team's office. However, your care team may teach you how to inject yourself. Follow all instructions. Talk to your care team about the use of this medication in children. Special care may be needed. Overdosage: If you think you have taken too much of this medicine contact a poison control center or emergency room at once. NOTE: This medicine is only for you. Do not share this medicine with others. What if I miss a dose? If you are given your dose at a clinic or care team's office, call to reschedule your appointment. If you give your own injections, and you miss a dose, take it as soon as you can. If it is almost time for your next dose, take only that dose. Do not take double or extra doses. What may interact with this medication? Alcohol Colchicine This list may not describe all possible  interactions. Give your health care provider a list of all the medicines, herbs, non-prescription drugs, or dietary supplements you use. Also tell them if you smoke, drink alcohol, or use illegal drugs. Some items may interact with your medicine. What should I watch for while using this medication? Visit your care team regularly. You may need blood work done while you are taking this medication. You may need to follow a special diet. Talk to your care team. Limit your alcohol intake and avoid smoking to get the best benefit. What side effects may I notice from receiving this medication? Side effects that you should report to your care team as soon as possible: Allergic reactions--skin rash, itching, hives, swelling of the face, lips, tongue, or throat Swelling of the ankles, hands, or feet Trouble breathing Side effects that usually do not require medical attention (report to your care team if they continue or are bothersome): Diarrhea This list may not describe all possible side effects. Call your doctor for medical advice about side effects. You may report side effects to FDA at 1-800-FDA-1088. Where should I keep my medication? Keep out of the reach of children. Store at room temperature between 15 and 30 degrees C (59 and 85 degrees F). Protect from light. Throw away any unused medication after the expiration date. NOTE: This sheet is a summary. It may not cover all possible information. If you have questions about this medicine, talk to your doctor, pharmacist, or health care provider.  2024 Elsevier/Gold Standard (2020-12-26 00:00:00)Octreotide  Injection Solution What is this medication? OCTREOTIDE  (ok  TREE oh tide) treats high levels of growth hormone (acromegaly). It works by reducing the amount of growth hormone your body makes. This reduces symptoms and the risk of health problems caused by too much growth hormone, such as diabetes and heart disease. It may also be used to treat diarrhea  caused by neuroendocrine tumors. It works by slowing down the release of serotonin from the tumor cells. This reduces the number of bowel movements you have. This medicine may be used for other purposes; ask your health care provider or pharmacist if you have questions. COMMON BRAND NAME(S): Bynfezia , Sandostatin  What should I tell my care team before I take this medication? They need to know if you have any of these conditions: Diabetes Gallbladder disease Heart disease Kidney disease Liver disease Pancreatic disease Thyroid disease An unusual or allergic reaction to octreotide , other medications, foods, dyes, or preservatives Pregnant or trying to get pregnant Breastfeeding How should I use this medication? This medication is injected under the skin or into a vein. It is usually given by your care team in a hospital or clinic setting. If you get this medication at home, you will be taught how to prepare and give it. Use exactly as directed. Take it as directed on the prescription label at the same time every day. Keep taking it unless your care team tells you to stop. Allow the injection solution to come to room temperature before use. Do not warm it artificially. It is important that you put your used needles and syringes in a special sharps container. Do not put them in a trash can. If you do not have a sharps container, call your pharmacist or care team to get one. Talk to your care team about the use of this medication in children. Special care may be needed. Overdosage: If you think you have taken too much of this medicine contact a poison control center or emergency room at once. NOTE: This medicine is only for you. Do not share this medicine with others. What if I miss a dose? If you miss a dose, take it as soon as you can. If it is almost time for your next dose, take only that dose. Do not take double or extra doses. What may interact with this medication? Bromocriptine Certain  medications for blood pressure, heart disease, irregular heartbeat Cyclosporine Diuretics Medications for diabetes, including insulin Quinidine This list may not describe all possible interactions. Give your health care provider a list of all the medicines, herbs, non-prescription drugs, or dietary supplements you use. Also tell them if you smoke, drink alcohol, or use illegal drugs. Some items may interact with your medicine. What should I watch for while using this medication? Visit your care team for regular checks on your progress. Tell your care team if your symptoms do not start to get better or if they get worse. To help reduce irritation at the injection site, use a different site for each injection and make sure the solution is at room temperature before use. This medication may cause decreases in blood sugar. Signs of low blood sugar include chills, cool, pale skin or cold sweats, drowsiness, extreme hunger, fast heartbeat, headache, nausea, nervousness or anxiety, shakiness, trembling, unsteadiness, tiredness, or weakness. Contact your care team right away if you experience any of these symptoms. This medication may increase blood sugar. The risk may be higher in patients who already have diabetes. Ask your care team what you can do to lower your risk of diabetes while  taking this medication. You should make sure you get enough vitamin B12 while you are taking this medication. Discuss the foods you eat and the vitamins you take with your care team. What side effects may I notice from receiving this medication? Side effects that you should report to your care team as soon as possible: Allergic reactions--skin rash, itching, hives, swelling of the face, lips, tongue, or throat Gallbladder problems--severe stomach pain, nausea, vomiting, fever Heart rhythm changes--fast or irregular heartbeat, dizziness, feeling faint or lightheaded, chest pain, trouble breathing High blood sugar  (hyperglycemia)--increased thirst or amount of urine, unusual weakness or fatigue, blurry vision Low blood sugar (hypoglycemia)--tremors or shaking, anxiety, sweating, cold or clammy skin, confusion, dizziness, rapid heartbeat Low thyroid levels (hypothyroidism)--unusual weakness or fatigue, increased sensitivity to cold, constipation, hair loss, dry skin, weight gain, feelings of depression Low vitamin B12 level--pain, tingling, or numbness in the hands or feet, muscle weakness, dizziness, confusion, trouble concentrating Oily or light-colored stools, diarrhea, bloating, weight loss Pancreatitis--severe stomach pain that spreads to your back or gets worse after eating or when touched, fever, nausea, vomiting Slow heartbeat--dizziness, feeling faint or lightheaded, confusion, trouble breathing, unusual weakness or fatigue Side effects that usually do not require medical attention (report these to your care team if they continue or are bothersome): Diarrhea Dizziness Headache Nausea Pain, redness, or irritation at injection site Stomach pain This list may not describe all possible side effects. Call your doctor for medical advice about side effects. You may report side effects to FDA at 1-800-FDA-1088. Where should I keep my medication? Keep out of the reach of children and pets. Store in the refrigerator. Protect from light. Allow to come to room temperature naturally. Do not use artificial heat. If protected from light, the injection may be stored between 20 and 30 degrees C (70 and 86 degrees F) for 14 days. After the initial use, throw away any unused portion of a multiple dose vial after 14 days. Get rid of any unused portions of the ampules after use. To get rid of medications that are no longer needed or have expired: Take the medication to a medication take-back program. Ask your pharmacy or law enforcement to find a location. If you cannot return the medication, ask your pharmacist or  care team how to get rid of the medication safely. NOTE: This sheet is a summary. It may not cover all possible information. If you have questions about this medicine, talk to your doctor, pharmacist, or health care provider.  2024 Elsevier/Gold Standard (2023-03-28 00:00:00)Influenza Vaccine Injection What is this medication? INFLUENZA VACCINE (in floo EN zuh vak SEEN) reduces the risk of the influenza (flu). It does not treat influenza. It is still possible to get influenza after receiving this vaccine, but the symptoms may be less severe or not last as long. It works by helping your immune system learn how to fight off a future infection. This medicine may be used for other purposes; ask your health care provider or pharmacist if you have questions. COMMON BRAND NAME(S): Afluria Trivalent, FLUAD Trivalent, Fluarix Trivalent, Flublok Trivalent, FLUCELVAX Trivalent, Flulaval Trivalent, Fluzone  Trivalent What should I tell my care team before I take this medication? They need to know if you have any of these conditions: Bleeding disorder like hemophilia Fever or infection Guillain-Barre syndrome or other neurological problems Immune system problems Infection with the human immunodeficiency virus (HIV) or AIDS Low blood platelet counts Multiple sclerosis An unusual or allergic reaction to influenza virus vaccine, latex, other  medications, foods, dyes, or preservatives. Different brands of vaccines contain different allergens. Some may contain latex or eggs. Talk to your care team about your allergies to make sure that you get the right vaccine. Pregnant or trying to get pregnant Breastfeeding How should I use this medication? This vaccine is injected into a muscle or under the skin. It is given by your care team. A copy of Vaccine Information Statements will be given before each vaccination. Be sure to read this sheet carefully each time. This sheet may change often. Talk to your care team to  see which vaccines are right for you. Some vaccines should not be used in all age groups. Overdosage: If you think you have taken too much of this medicine contact a poison control center or emergency room at once. NOTE: This medicine is only for you. Do not share this medicine with others. What if I miss a dose? This does not apply. What may interact with this medication? Certain medications that lower your immune system, such as etanercept, anakinra, infliximab, adalimumab Certain medications that prevent or treat blood clots, such as warfarin Chemotherapy or radiation therapy Phenytoin Steroid medications, such as prednisone or cortisone Theophylline Vaccines This list may not describe all possible interactions. Give your health care provider a list of all the medicines, herbs, non-prescription drugs, or dietary supplements you use. Also tell them if you smoke, drink alcohol, or use illegal drugs. Some items may interact with your medicine. What should I watch for while using this medication? Report any side effects that do not go away with your care team. Call your care team if any unusual symptoms occur within 6 weeks of receiving this vaccine. You may still catch the flu, but the illness is not usually as bad. You cannot get the flu from the vaccine. The vaccine will not protect against colds or other illnesses that may cause fever. The vaccine is needed every year. What side effects may I notice from receiving this medication? Side effects that you should report to your care team as soon as possible: Allergic reactions--skin rash, itching, hives, swelling of the face, lips, tongue, or throat Side effects that usually do not require medical attention (report these to your care team if they continue or are bothersome): Chills Fatigue Headache Joint pain Loss of appetite Muscle pain Nausea Pain, redness, or irritation at injection site This list may not describe all possible side  effects. Call your doctor for medical advice about side effects. You may report side effects to FDA at 1-800-FDA-1088. Where should I keep my medication? The vaccine is only given by your care team. It will not be stored at home. NOTE: This sheet is a summary. It may not cover all possible information. If you have questions about this medicine, talk to your doctor, pharmacist, or health care provider.  2024 Elsevier/Gold Standard (2021-09-25 00:00:00)

## 2024-01-28 NOTE — Telephone Encounter (Signed)
 Patient has been scheduled for follow-up visit per 01/28/24 LOS.  Pt aware of scheduled appt details.

## 2024-02-04 ENCOUNTER — Encounter: Payer: Self-pay | Admitting: Oncology

## 2024-02-16 DIAGNOSIS — K56609 Unspecified intestinal obstruction, unspecified as to partial versus complete obstruction: Secondary | ICD-10-CM | POA: Diagnosis not present

## 2024-02-16 DIAGNOSIS — K56699 Other intestinal obstruction unspecified as to partial versus complete obstruction: Secondary | ICD-10-CM | POA: Diagnosis not present

## 2024-02-16 DIAGNOSIS — R1110 Vomiting, unspecified: Secondary | ICD-10-CM | POA: Diagnosis not present

## 2024-02-16 DIAGNOSIS — R1084 Generalized abdominal pain: Secondary | ICD-10-CM | POA: Diagnosis not present

## 2024-02-16 DIAGNOSIS — C7B04 Secondary carcinoid tumors of peritoneum: Secondary | ICD-10-CM | POA: Diagnosis not present

## 2024-02-16 DIAGNOSIS — C7A098 Malignant carcinoid tumors of other sites: Secondary | ICD-10-CM | POA: Diagnosis not present

## 2024-02-16 DIAGNOSIS — K56600 Partial intestinal obstruction, unspecified as to cause: Secondary | ICD-10-CM | POA: Diagnosis not present

## 2024-02-16 NOTE — ED Provider Notes (Signed)
 [facility] Emergency Department Provider Note  This document was created using the aid of voice recognition Dragon dictation software.   Provider at bedside: 02/17/2024 10:32 PM  History obtained from the: Patient  History   Chief Complaint  Patient presents with  . Abdominal Pain  . Vomiting    HPI  Glenn Bender is a 63 y.o. male who presents to the ED with complaints of abdominal pain and vomiting.  Patient reports he has had abdominal pain and vomiting for about 3 days.  He reports he has a history of a bowel obstruction for unknown tumor in his abdomen.  He reports that intermittently he will have abdominal pain and vomiting.  Reports that normally last about a day.  He reports that about 2 years ago the surgeon recommended surgery however he declined.  Denies any headache, fevers, chest pain or shortness of breath.  He denies any diarrhea or constipation.  Reports has had a normal bowel movement earlier today.  No LMP for male patient. ______________________ ROS: Pertinent positives and negatives per HPI.  Past Medical History Reviewed in chart  Past Surgical History Reviewed in chart  Medications These were reviewed. See nursing note for details.  Allergies Reviewed in chart  Family History Reviewed in chart  Social History Reviewed in chart  All pertinent family history, social history and PMHx including medical, surgical, current medications and allergies were reviewed by myself. See nursing note for details.   Physical Exam  I have reviewed the following vital signs:  Vitals:   02/16/24 1923 02/17/24 0120 02/17/24 0147 02/17/24 0231  BP: 145/84   129/83  BP Location: Left arm   Right arm  Patient Position: Sitting   Lying  Pulse: 68 70 70 72  Resp: 20   20  Temp: 97.8 F (36.6 C)   97.6 F (36.4 C)  TempSrc: Oral   Oral  SpO2: 98% 95% 96% 96%  Weight: 113 kg (250 lb)     Height: 177.8 cm (5' 10)       Physical  Exam Constitutional:      General: He is not in acute distress.    Appearance: He is not ill-appearing, toxic-appearing or diaphoretic.  HENT:     Head: Normocephalic and atraumatic.     Nose: Nose normal. No congestion or rhinorrhea.     Mouth/Throat:     Mouth: Mucous membranes are moist.     Pharynx: Oropharynx is clear.  Eyes:     General: No scleral icterus. Cardiovascular:     Rate and Rhythm: Normal rate.     Heart sounds: No murmur heard.    No friction rub. No gallop.  Pulmonary:     Effort: Pulmonary effort is normal. No respiratory distress.     Breath sounds: No stridor. No wheezing, rhonchi or rales.  Chest:     Chest wall: No tenderness.  Abdominal:     General: Abdomen is flat. There is distension.     Palpations: Abdomen is soft.     Tenderness: There is abdominal tenderness in the epigastric area.     Hernia: A hernia is present. Hernia is present in the umbilical area.     Comments: High pitch bowel sounds  Musculoskeletal:     Cervical back: Normal range of motion.  Skin:    General: Skin is warm and dry.     Capillary Refill: Capillary refill takes less than 2 seconds.  Neurological:  General: No focal deficit present.     Mental Status: He is alert and oriented to person, place, and time.  Psychiatric:        Mood and Affect: Mood normal.        Behavior: Behavior normal.      Procedure Note  Procedures  Medical Decision Making  Discussed options for workup with patient including risks and benefits via shared decision making and decided to proceed with workup.  ED Course ED Course as of 02/17/24 0232  Tue Feb 17, 2024  0118 My interpretation of labs-no leukocytosis or anemia.  Mild thrombocytopenia was platelet count of 142.  No electrolyte abnormalities requiring intervention patient's creatinine is at his baseline based on chart review and Care Everywhere lipase within normal limits. [AO]  0118 CT Abdomen Pelvis WO Contrast My  interpretation of the CT abdomen pelvis is that there is a bowel obstruction [AO]  0154 Dr. Von agrees to admit the patient [AO]    ED Course User Index [AO] Isaiah Carlyon Merles, PA-C    Initial Differential Diagnoses: I have considered the following in determining workup and treatments: gastritis, GERD, peptic ulcer disease, cholecystitis, colitis, gastroenteritis, appendicitis, small bowel obstruction, urinary tract infection, mesenteric ischemia, diverticulitis, pancreatitis, AAA, pyelonephritis, nephrolithiasis, shingles, perforated bowel or ulcer, inflammatory bowel disease, or strangulated/incarcerated hernia.   Labs/Imaging/other testing ordered due to concerns discussed in history of and physical exam include: cbc, cmp, CT abdomen and pelvis wo contrast, and lipase  I considered ordering ua but after history/exam/other workup, do not feel this is indicated at this time.  Medical Decision Making Amount and/or Complexity of Data Reviewed Labs: ordered. Radiology: ordered. Decision-making details documented in ED Course.  Risk Decision regarding hospitalization.    Labs Reviewed: See ED course  Imaging Reviewed: See ED course   Discussed management or test interpretation with other qualified healthcare professional: The following consultations were required: Case discussed with Asberry Glatter PA-C with Surgery who will see patient in the morning.  and Case discussed with Von Arista MD with Hospitalist who will come see patient for admission.   Clinical Complexity  Number and complexity of problems addressed: Patient's presentation is most consistent with acute presentation with potential threat to life or bodily function..  Decision to admit or increased level of care requiring transfer: Yes: Pt admitted due to findings on exam/workup  Discussed options for workup with patient including risks and benefits via shared decision making and decided to proceed with  workup.  Evidence based calculators if applicable:     Medical Record Review: I reviewed medical records available. Specifically, I reviewed the general surgery note from 01/31/2022 where patient was evaluated for small bowel obstruction secondary to carcinoid mass.  Provider time spent in patient care today, inclusive of but not limited to clinical reassessment, review of diagnostic studies, and discharge preparation, was greater than 30 minutes.   ED Assessment and Plan   Pt is a 63 y.o. male who presented with abdominal pain nausea and vomiting for 3 days.  Patient has history of small bowel obstruction due to known carcinoid tumor in his abdomen.  On presentation to ED patient is hemodynamically stable and not in acute distress.  On physical exam patient has distention in his abdomen along with tenderness over his epigastric region.  No active nausea or vomiting.  CT scan shows small bowel obstruction.  Patient will be admitted to hospitalist for general surgery consult in the morning.  History, physical exam, labs and  imaging results are noted above. Patient in agreement with plan, all questions answered to satisfaction and patient is  admitted to hospitalist service, care is transferred.   ED Clinical Impression   1. Small bowel obstruction    (CMD)   2. Carcinoid tumor, unspecified site, unspecified whether malignant     ED Disposition     ED Disposition  Admit   Condition  --   Comment  Primary Provider Group: Yes [1]  Provider Group:: [facility] Hospitalist Team [facility]) [[id number]]  Certification: I certify that inpatient services are medically necessary for this patient for a duration of greater than two midnights. See H&P and MD Progress Notes for additional information about the patient's course of treatment.          Current Rx ordered in [facility][1]   FOLLOW UP No follow-up provider specified. ____________________________       [1] Current Facility-Administered  Medications Ordered in [facility]  Medication Dose Route Frequency Provider Last Rate Last Admin  . dextrose  (D50W) 50 % injection 12.5 g  12.5 g intravenous PRN Navneet Kumar, MD      . dextrose  (GLUTOSE) 40 % oral gel 15 g  15 g oral PRN Navneet Kumar, MD      . guaiFENesin (ROBITUSSIN) 100 mg/5 mL syrup 400 mg  400 mg oral Q8H PRN Navneet Kumar, MD      . hydrOXYzine (ATARAX) tablet 10 mg  10 mg oral Q6H PRN Navneet Kumar, MD      . lactated ringer's infusion  75 mL/hr intravenous Continuous Navneet Kumar, MD      . melatonin tablet 6 mg  6 mg oral At Bedtime PRN Navneet Kumar, MD      . morphine injection 4 mg  4 mg intravenous Q3H PRN Navneet Kumar, MD      . ondansetron  (ZOFRAN ) injection 4 mg  4 mg intravenous Q6H PRN Navneet Kumar, MD       Meds Ordered in [facility]  Medication Sig Dispense Refill  . acetaminophen  (TYLENOL ) 500 mg tablet Take 500 mg by mouth in the morning and 500 mg in the evening.    . celecoxib (CeleBREX) 200 mg capsule Take 200 mg by mouth 2 (two) times a day.

## 2024-02-17 DIAGNOSIS — R1084 Generalized abdominal pain: Secondary | ICD-10-CM | POA: Diagnosis not present

## 2024-02-17 DIAGNOSIS — D696 Thrombocytopenia, unspecified: Secondary | ICD-10-CM | POA: Diagnosis not present

## 2024-02-17 DIAGNOSIS — R1110 Vomiting, unspecified: Secondary | ICD-10-CM | POA: Diagnosis not present

## 2024-02-17 DIAGNOSIS — I2510 Atherosclerotic heart disease of native coronary artery without angina pectoris: Secondary | ICD-10-CM | POA: Diagnosis not present

## 2024-02-17 DIAGNOSIS — C787 Secondary malignant neoplasm of liver and intrahepatic bile duct: Secondary | ICD-10-CM | POA: Diagnosis not present

## 2024-02-17 DIAGNOSIS — C7B02 Secondary carcinoid tumors of liver: Secondary | ICD-10-CM | POA: Diagnosis not present

## 2024-02-17 DIAGNOSIS — C7A098 Malignant carcinoid tumors of other sites: Secondary | ICD-10-CM | POA: Diagnosis not present

## 2024-02-17 DIAGNOSIS — C7A00 Malignant carcinoid tumor of unspecified site: Secondary | ICD-10-CM | POA: Diagnosis not present

## 2024-02-17 DIAGNOSIS — Z8547 Personal history of malignant neoplasm of testis: Secondary | ICD-10-CM | POA: Diagnosis not present

## 2024-02-17 DIAGNOSIS — K56609 Unspecified intestinal obstruction, unspecified as to partial versus complete obstruction: Secondary | ICD-10-CM | POA: Diagnosis not present

## 2024-02-17 DIAGNOSIS — C7B04 Secondary carcinoid tumors of peritoneum: Secondary | ICD-10-CM | POA: Diagnosis not present

## 2024-02-17 DIAGNOSIS — Z87891 Personal history of nicotine dependence: Secondary | ICD-10-CM | POA: Diagnosis not present

## 2024-02-17 DIAGNOSIS — N1832 Chronic kidney disease, stage 3b: Secondary | ICD-10-CM | POA: Diagnosis not present

## 2024-02-25 ENCOUNTER — Inpatient Hospital Stay

## 2024-02-25 VITALS — BP 134/82 | HR 71 | Temp 98.2°F | Resp 20

## 2024-02-25 DIAGNOSIS — C7B8 Other secondary neuroendocrine tumors: Secondary | ICD-10-CM

## 2024-02-25 DIAGNOSIS — C7A00 Malignant carcinoid tumor of unspecified site: Secondary | ICD-10-CM | POA: Diagnosis not present

## 2024-02-25 MED ORDER — CYANOCOBALAMIN 1000 MCG/ML IJ SOLN
1000.0000 ug | Freq: Once | INTRAMUSCULAR | Status: AC
  Administered 2024-02-25: 1000 ug via INTRAMUSCULAR
  Filled 2024-02-25: qty 1

## 2024-02-25 MED ORDER — OCTREOTIDE ACETATE 30 MG IM KIT
30.0000 mg | PACK | Freq: Once | INTRAMUSCULAR | Status: AC
  Administered 2024-02-25: 30 mg via INTRAMUSCULAR
  Filled 2024-02-25: qty 1

## 2024-02-25 NOTE — Patient Instructions (Signed)
 Vitamin B12 Injection What is this medication? Vitamin B12 (VAHY tuh min B12) prevents and treats low vitamin B12 levels in your body. It is used in people who do not get enough vitamin B12 from their diet or when their digestive tract does not absorb enough. Vitamin B12 plays an important role in maintaining the health of your nervous system and red blood cells. This medicine may be used for other purposes; ask your health care provider or pharmacist if you have questions. COMMON BRAND NAME(S): B-12 Compliance Kit, B-12 Injection Kit, Cyomin, Dodex, LA-12, Nutri-Twelve, Physicians EZ Use B-12, Primabalt, Vitamin Deficiency Injectable System - B12 What should I tell my care team before I take this medication? They need to know if you have any of these conditions: Kidney disease Leber's disease Megaloblastic anemia An unusual or allergic reaction to cyanocobalamin, cobalt, other medications, foods, dyes, or preservatives Pregnant or trying to get pregnant Breast-feeding How should I use this medication? This medication is injected into a muscle or deeply under the skin. It is usually given in a clinic or care team's office. However, your care team may teach you how to inject yourself. Follow all instructions. Talk to your care team about the use of this medication in children. Special care may be needed. Overdosage: If you think you have taken too much of this medicine contact a poison control center or emergency room at once. NOTE: This medicine is only for you. Do not share this medicine with others. What if I miss a dose? If you are given your dose at a clinic or care team's office, call to reschedule your appointment. If you give your own injections, and you miss a dose, take it as soon as you can. If it is almost time for your next dose, take only that dose. Do not take double or extra doses. What may interact with this medication? Alcohol Colchicine This list may not describe all possible  interactions. Give your health care provider a list of all the medicines, herbs, non-prescription drugs, or dietary supplements you use. Also tell them if you smoke, drink alcohol, or use illegal drugs. Some items may interact with your medicine. What should I watch for while using this medication? Visit your care team regularly. You may need blood work done while you are taking this medication. You may need to follow a special diet. Talk to your care team. Limit your alcohol intake and avoid smoking to get the best benefit. What side effects may I notice from receiving this medication? Side effects that you should report to your care team as soon as possible: Allergic reactions--skin rash, itching, hives, swelling of the face, lips, tongue, or throat Swelling of the ankles, hands, or feet Trouble breathing Side effects that usually do not require medical attention (report to your care team if they continue or are bothersome): Diarrhea This list may not describe all possible side effects. Call your doctor for medical advice about side effects. You may report side effects to FDA at 1-800-FDA-1088. Where should I keep my medication? Keep out of the reach of children. Store at room temperature between 15 and 30 degrees C (59 and 85 degrees F). Protect from light. Throw away any unused medication after the expiration date. NOTE: This sheet is a summary. It may not cover all possible information. If you have questions about this medicine, talk to your doctor, pharmacist, or health care provider.  2024 Elsevier/Gold Standard (2020-12-26 00:00:00)Octreotide Injection Solution What is this medication? OCTREOTIDE (ok  TREE oh tide) treats high levels of growth hormone (acromegaly). It works by reducing the amount of growth hormone your body makes. This reduces symptoms and the risk of health problems caused by too much growth hormone, such as diabetes and heart disease. It may also be used to treat diarrhea  caused by neuroendocrine tumors. It works by slowing down the release of serotonin from the tumor cells. This reduces the number of bowel movements you have. This medicine may be used for other purposes; ask your health care provider or pharmacist if you have questions. COMMON BRAND NAME(S): Berline Lopes, Sandostatin What should I tell my care team before I take this medication? They need to know if you have any of these conditions: Diabetes Gallbladder disease Heart disease Kidney disease Liver disease Pancreatic disease Thyroid disease An unusual or allergic reaction to octreotide, other medications, foods, dyes, or preservatives Pregnant or trying to get pregnant Breastfeeding How should I use this medication? This medication is injected under the skin or into a vein. It is usually given by your care team in a hospital or clinic setting. If you get this medication at home, you will be taught how to prepare and give it. Use exactly as directed. Take it as directed on the prescription label at the same time every day. Keep taking it unless your care team tells you to stop. Allow the injection solution to come to room temperature before use. Do not warm it artificially. It is important that you put your used needles and syringes in a special sharps container. Do not put them in a trash can. If you do not have a sharps container, call your pharmacist or care team to get one. Talk to your care team about the use of this medication in children. Special care may be needed. Overdosage: If you think you have taken too much of this medicine contact a poison control center or emergency room at once. NOTE: This medicine is only for you. Do not share this medicine with others. What if I miss a dose? If you miss a dose, take it as soon as you can. If it is almost time for your next dose, take only that dose. Do not take double or extra doses. What may interact with this medication? Bromocriptine Certain  medications for blood pressure, heart disease, irregular heartbeat Cyclosporine Diuretics Medications for diabetes, including insulin Quinidine This list may not describe all possible interactions. Give your health care provider a list of all the medicines, herbs, non-prescription drugs, or dietary supplements you use. Also tell them if you smoke, drink alcohol, or use illegal drugs. Some items may interact with your medicine. What should I watch for while using this medication? Visit your care team for regular checks on your progress. Tell your care team if your symptoms do not start to get better or if they get worse. To help reduce irritation at the injection site, use a different site for each injection and make sure the solution is at room temperature before use. This medication may cause decreases in blood sugar. Signs of low blood sugar include chills, cool, pale skin or cold sweats, drowsiness, extreme hunger, fast heartbeat, headache, nausea, nervousness or anxiety, shakiness, trembling, unsteadiness, tiredness, or weakness. Contact your care team right away if you experience any of these symptoms. This medication may increase blood sugar. The risk may be higher in patients who already have diabetes. Ask your care team what you can do to lower your risk of diabetes while  taking this medication. You should make sure you get enough vitamin B12 while you are taking this medication. Discuss the foods you eat and the vitamins you take with your care team. What side effects may I notice from receiving this medication? Side effects that you should report to your care team as soon as possible: Allergic reactions--skin rash, itching, hives, swelling of the face, lips, tongue, or throat Gallbladder problems--severe stomach pain, nausea, vomiting, fever Heart rhythm changes--fast or irregular heartbeat, dizziness, feeling faint or lightheaded, chest pain, trouble breathing High blood sugar  (hyperglycemia)--increased thirst or amount of urine, unusual weakness or fatigue, blurry vision Low blood sugar (hypoglycemia)--tremors or shaking, anxiety, sweating, cold or clammy skin, confusion, dizziness, rapid heartbeat Low thyroid levels (hypothyroidism)--unusual weakness or fatigue, increased sensitivity to cold, constipation, hair loss, dry skin, weight gain, feelings of depression Low vitamin B12 level--pain, tingling, or numbness in the hands or feet, muscle weakness, dizziness, confusion, trouble concentrating Oily or light-colored stools, diarrhea, bloating, weight loss Pancreatitis--severe stomach pain that spreads to your back or gets worse after eating or when touched, fever, nausea, vomiting Slow heartbeat--dizziness, feeling faint or lightheaded, confusion, trouble breathing, unusual weakness or fatigue Side effects that usually do not require medical attention (report these to your care team if they continue or are bothersome): Diarrhea Dizziness Headache Nausea Pain, redness, or irritation at injection site Stomach pain This list may not describe all possible side effects. Call your doctor for medical advice about side effects. You may report side effects to FDA at 1-800-FDA-1088. Where should I keep my medication? Keep out of the reach of children and pets. Store in the refrigerator. Protect from light. Allow to come to room temperature naturally. Do not use artificial heat. If protected from light, the injection may be stored between 20 and 30 degrees C (70 and 86 degrees F) for 14 days. After the initial use, throw away any unused portion of a multiple dose vial after 14 days. Get rid of any unused portions of the ampules after use. To get rid of medications that are no longer needed or have expired: Take the medication to a medication take-back program. Ask your pharmacy or law enforcement to find a location. If you cannot return the medication, ask your pharmacist or  care team how to get rid of the medication safely. NOTE: This sheet is a summary. It may not cover all possible information. If you have questions about this medicine, talk to your doctor, pharmacist, or health care provider.  2024 Elsevier/Gold Standard (2023-03-28 00:00:00)

## 2024-02-26 ENCOUNTER — Inpatient Hospital Stay: Admitting: Oncology

## 2024-02-26 DIAGNOSIS — C7B8 Other secondary neuroendocrine tumors: Secondary | ICD-10-CM

## 2024-02-26 DIAGNOSIS — C7A00 Malignant carcinoid tumor of unspecified site: Secondary | ICD-10-CM

## 2024-02-26 DIAGNOSIS — C7B02 Secondary carcinoid tumors of liver: Secondary | ICD-10-CM | POA: Diagnosis not present

## 2024-02-26 NOTE — Progress Notes (Signed)
 [facility]  [address] [place],  [place]  [address] [phone]    Via Televisit: I connected with Glenn Bender on 02/26/24 at 3:30 PM EDT by a video enabled telemedicine application and verified that I am speaking with the correct person using two identifiers.  Location: Patient: Home. No others were present. Provider: Present in office, accompanied by scribe.   -No physical exam could be performed with this format. -My office staff/schedulers were to discuss with the patient that there may be a monetary charge related to this service, depending on their medical insurance. My understanding is that the patient understood and consented to proceed. -I discussed the limitations of evaluation and management by telemedicine and the availability of in person appointments. The patient expressed understanding and agreed to proceed.   Clinic Day: 02/26/2024  Referring physician: Allen, Chad, NP  ASSESSMENT & PLAN:  Assessment: Recurrent small bowel obstruction The patient describes having abdominal pain for 2 days, severe and associated with nausea and vomiting. He was admitted to [facility] on 02/16/2024 for a small bowel obstruction. CT abdomen pelvis on 02/17/2024 revealed recurrent small bowel obstruction from known metastatic carcinoid tumor to mesentery in the right mid abdomen with no perforation, hepatic metastases not appreciably changed from comparison study on 01/18/2024, hepatosplenomegaly with trace perisplenic ascites, and bilateral nonobstructing nephrolithiases. He was admitted for 2 days and treated conservatively. This had occurred once before approximately 2-3 years ago, but we discussed that it will likely occur again.  Malignant carcinoid tumor of unknown primary site [facility]) Metastatic carcinoid tumor consistent with gastrointestinal origin in September 2021.  He has a terminal ileum mass measuring 4.0 x 2.8 x 3.7 cm, and a mass of the  mesentery of the small intestine measuring 3.9 x 2.3 x 4.0 cm.  As this has already metastasized to the liver, surgical resection was not indicated.  24 hour urine for 5 HIAA was quite elevated at 86.8 consistent with carcinoid syndrome.  Chromogranin A was also elevated at 214. He started treatment with octreotide  injections every 4 weeks in October 2021.  Due to the persistent diarrhea, we increased the octreotide  to 30 mg every 4 weeks.  Over time, his disease has been stable to decreased on repeat CT imaging.  The chromogranin A, has remained elevated, but has not progressively increased.  Chromogranin A in March 2025 was 143.2, and it continues to slowly decrease to 139.2 in July, 2025.    CT abdomen and pelvis done on 01/22/2024 that revealed the numerous hepatic metastases are stable, two calcified mesenteric masses  consistent with his known carcinoid tumor. There are peripheral mesenteric lymph nodes and spiculation consistent with fibrotic reactions surrounding the larger mass and an adjacent small bowel loop that is slightly dilated at the same region in the right mid abdomen possibly secondary to mild stenotic changes along the mass. There was no discrete bowel obstruction, multiple peritoneal nodules along the peritoneal reflections of the pelvic cavity, and a short-segment focal thickening of terminal ileum with questionable hypodense mural lesion. He has done very well for 4 years with stable scans and minimal symptoms. We will continue with the current therapy of monthly Sandostatin .     Secondary neuroendocrine tumor of liver (HCC) Multiple liver masses, the largest measuring 4.0 x 4.3 cm, consistent with metastatic carcinoid with carcinoid syndrome.  His disease remains stable to improved on CT imaging in April.  He will continue octreotide  every 4 weeks.  B12 deficiency anemia B12 deficiency diagnosed in November 2024. He received a protracted course of B12 injections and continues B12  monthly.   Thrombocytopenia (HCC) Mild chronic thrombocytopenia, which had worsened. He continues B12 injections monthly. Folate in November 2024 was normal and repeat in July, 2025 remains normal. His latest platelet count was up to 126,000.    Plan: This patient has carcinoid metastatic to liver and has been stable on Sandostatin  injections for several years. His last CT abdomen and pelvis done on 01/22/2024 revealed numerous hepatic metastases which were stable, and two calcified mesenteric masses  consistent with his known carcinoid tumor. There are peripheral mesenteric lymph nodes and spiculation consistent with fibrotic reactions surrounding the larger mass and an adjacent small bowel loop that is slightly dilated at the same region in the right mid abdomen possibly secondary to mild stenotic changes along the mass. There was no discrete bowel obstruction, multiple peritoneal nodules along the peritoneal reflections of the pelvic cavity, and a short-segment focal thickening of terminal ileum with questionable hypodense mural lesion. The patient had been admitted to [facility] 2-3 years ago with a bowel obstruction caused by his mesenteric mass, but this resolved on its own. We held off on surgery since he had not had a recurrent episode.  The patient describes having abdominal pain for 2 days recently, severe and associated with nausea and vomiting. He was admitted to [facility] on 02/16/2024 for a small bowel obstruction. CT abdomen pelvis on 02/17/2024 revealed recurrent small bowel obstruction from known metastatic carcinoid tumor to mesentery in the right mid abdomen with no perforation, hepatic metastases not appreciably changed from comparison study on 01/18/2024, hepatosplenomegaly with trace perisplenic ascites, and bilateral nonobstructing nephrolithiases. He was admitted for 2 days and treated conservatively and was stable for discharge. He met with a  surgeon while there who has recommended a follow-up appointment in his clinic to discuss elective surgery. He is feeling much improved now, but I recommended that he proceed with this appointment and surgery since this is likely to occur again. I think they would recommend resection of the mesenteric mass which is the cause of his recurrent small bowel obstruction. I doubt he could resect the liver lesions, but we will plan to continue Sandostatin  injections and his disease has been relatively stable for years. He would have less risk of complications if he does this electively rather than in an emergency situation with obstruction. He will keep me informed of his decisions and I will be glad to assist in any way for supportive care or further discussion. He will keep his usual appointments for Sandostatin  injections and follow-up.  I provided 20 minutes of telehealth time during this encounter and > 50% was spent counseling as documented under my assessment and plan.   No orders of the defined types were placed in this encounter.  Wanda VEAR Cornish, MD  [facility] - A DEPT OF [facility] [address] [place] [address] Dept: [phone] Dept Fax: [phone]    CHIEF COMPLAINT:  CC: Malignant carcinoid  Current Treatment: Octreotide  every 4 weeks  HISTORY OF PRESENT ILLNESS:  Glenn Bender is a 63 y.o. male with metastatic carcinoid to liver diagnosed in September 2021.  The patient presented with hematuria, felt to be due to a kidney stone.  CT abdomen and pelvis revealed multiple liver masses, the largest measuring 4.0 x 4.3 cm, a terminal  ileum mass measuring 4.0 x 2.8 x 3.7 cm, and a mass of the mesentery of the small intestine measuring 3.9 x 2.3 x 4.0 cm.  Diagnostic colonoscopy with Dr. Allana in September revealed a large mucosal covered mass protruding through the ileocecal valve with peristalsis.  Biopsy was collected and  surgical pathology was benign.  Liver biopsy revealed metastatic neuroendocrine tumor, grade 2, consistent with gastrointestinal primary. Synaptophysin and chromogranin-A immunostains were positive as well as CDX-2 supporting gastrointestinal origin.  Ki67 was 6%.  He reports flushing of his face with alcohol and certain foods.  He also reported diarrhea.  24 hour urine for 5HIAA was also elevated at 86.8, which is consistent with carcinoid.  Chromogranin A was elevated at 214 in October and came down to 116.9 in December.  CT chest from November 2021 revealed left paratracheal adenopathy and pleural nodularity along the right hemidiaphragm measuring up to 12 mm, worrisome for metastatic disease. As he had metastatic disease, resection of his primary was not recommended. The patient was started on octreotide  20 mg IM monthly in October.  Octreotide  was subsequently increased to 30 mg monthly in December due to persistent diarrhea.   CT chest, abdomen and pelvis in February 2022 revealed stable to slightly decreased appearance of the carcinoid tumor, including the mesenteric lesion.  The liver lesions were stable with no new lesions. There was stable nodularity/lobularity along the right hemidiaphragm.  He has had a continued decrease in the Chromogranin A, which was 111.4 in February, and then 100.3 in March, which is within normal limits.  He had improvement in his diarrhea with the increase in octreotide . The chromogranin A was up slightly in May to 126. CT chest, abdomen and pelvis in August revealed no new or progressive metastatic disease.  Findings are suggestive of multifocal small bowel carcinoid with conglomerate mesenteric nodal metastases.  Widespread hyperenhancing liver metastases are stable.  Stable clustered subpleural solid pulmonary nodules along the right hemidiaphragm in the basilar right lower lobe are compatible with pulmonary metastases.  There is new background diffuse hepatic steatosis.  Chromogranin A was down to 107.5 in August.  The chromogranin A was 118 in November.   CT abdomen and pelvis in April 2023 revealed stable hepatic metastatic disease and stable hepatic steatosis.  There was a stable 3.6 cm partially calcified mesenteric mass, consistent with carcinoid tumor.  No evidence of new or progressive metastatic disease.  Bilateral nephrolithiasis, a small left inguinal hernia and aortic atherosclerosis were also seen.  He was hospitalized in September 2023 at [facility] with a small bowel obstruction.  CT abdomen and pelvis at that time revealed small-bowel obstruction, with transition point in the mid to distal jejunum adjacent to the known mesenteric carcinoid tumor. There was a stable calcified central mesenteric mass consistent with carcinoid tumor, and numerous liver masses, consistent with metastatic carcinoid tumor. The index lesions are not appreciably changed since prior exam. Hepatic steatosis, a non-obstructing left renal calculus and aortic atherosclerosis were seen.  We have continued octreotide  30 mg every 4 weeks with good control of his diarrhea.  He had a follow-up x-ray for his left kidney stone in December, which was stable.     CT abdomen and pelvis in April 2024 revealed no significant change in the calcified retractile mass in the base of the mesentery, typical of carcinoid. No gross change in the previously demonstrated hepatic metastases, although evaluation is limited by the lack of intravenous contrast and interval improvement in background hepatic steatosis.  Interval resolution of previously demonstrated small bowel distension. No evidence of progressive metastatic disease. Nonobstructing left renal calculi.  Aortic Atherosclerosis.  He reported passing kidney stone in June.  The chromogranin A has been fairly stable over the past year, running around 145, with a max of 176.  CT abdomen and pelvis in October 2024 revealed stable calcified mesenteric mass and  shotty subcentimeter mesenteric lymph nodes, as well as diffuse hepatic metastasis and hepatic steatosis without significant change.  No new or progressive metastatic disease was seen within the abdomen or pelvis.  Chromogranin A was 143 in March 2025.  CT abdomen and pelvis in April revealed numerous hepatic metastases again noted. Index lesions in the dome of the liver and left liver have decreased in size in the interval. Index lesion in the posterior right liver is stable to slightly decreased in size in the interval. Borderline lymphadenopathy in the central small bowel mesentery with irregular 3.9 x 3.5 cm calcified mesenteric mass in the mid abdomen, which appeared more prominent than on the previous exam.  There is a second calcified lesion in the left lower quadrant measuring 2.6 x 1.1 cm, apparently in the sigmoid mesocolon, was also felt to be larger, measuring 10 mm thickness, previously 7 mm thickness.  Multiple soft tissue nodules in the central pelvis along the peritoneal reflection, similar to prior and suspicious for metastatic involvement.  Similar soft tissue fullness in the terminal ileum with some mild distension of ileum proximal to this level raising the question of underlying component of stenosis. Stable staghorn calculus lower pole left kidney without hydronephrosis. Tiny nonobstructing stones in the lower pole right kidney.     Oncology History  Malignant carcinoid tumor of unknown primary site [facility])  02/11/2020 Initial Diagnosis   Malignant carcinoid tumor of unknown primary site [facility])     INTERVAL HISTORY:  This patient has carcinoid metastatic to liver and has been stable on Sandostatin  injections for several years. His last CT abdomen and pelvis done on 01/22/2024 revealed numerous hepatic metastases which were stable, and two calcified mesenteric masses  consistent with his known carcinoid tumor. There are peripheral mesenteric lymph nodes and spiculation consistent with  fibrotic reactions surrounding the larger mass and an adjacent small bowel loop that is slightly dilated at the same region in the right mid abdomen possibly secondary to mild stenotic changes along the mass. There was no discrete bowel obstruction, multiple peritoneal nodules along the peritoneal reflections of the pelvic cavity, and a short-segment focal thickening of terminal ileum with questionable hypodense mural lesion. The patient had been admitted to [facility] 2-3 years ago with a bowel obstruction caused by his mesenteric mass, but this resolved on its own. We held off on surgery since he had not had a recurrent episode.  The patient describes having abdominal pain for 2 days recently, severe and associated with nausea and vomiting. He was admitted to [facility] on 02/16/2024 for a small bowel obstruction. CT abdomen pelvis on 02/17/2024 revealed recurrent small bowel obstruction from known metastatic carcinoid tumor to mesentery in the right mid abdomen with no perforation, hepatic metastases not appreciably changed from comparison study on 01/18/2024, hepatosplenomegaly with trace perisplenic ascites, and bilateral nonobstructing nephrolithiases. He was admitted for 2 days and treated conservatively and was stable for discharge. He met with a surgeon while there who has recommended a follow-up appointment in his clinic to discuss elective surgery. He is feeling much improved now, but I  recommended that he proceed with this appointment and surgery since this is likely to occur again. I think they would recommend resection of the mesenteric mass which is the cause of his recurrent small bowel obstruction. I doubt he could resect the liver lesions, but we will plan to continue Sandostatin  injections and his disease has been relatively stable for years. He would have less risk of complications if he does this electively rather than in an emergency situation with  obstruction. He will keep me informed of his decisions and I will be glad to assist in any way for supportive care or further discussion. He will keep his usual appointments for Sandostatin  injections and follow-up.  REVIEW OF SYSTEMS:   Review of Systems  Constitutional: Negative.  Negative for appetite change, chills, diaphoresis, fatigue, fever and unexpected weight change.  HENT:  Negative.  Negative for hearing loss, lump/mass, mouth sores, nosebleeds, sore throat, tinnitus, trouble swallowing and voice change.   Eyes: Negative.   Respiratory: Negative.  Negative for chest tightness, cough, hemoptysis, shortness of breath and wheezing.   Cardiovascular: Negative.  Negative for chest pain, leg swelling and palpitations.  Gastrointestinal: Negative.  Negative for abdominal distention, abdominal pain, blood in stool, constipation, diarrhea, nausea, rectal pain and vomiting.  Endocrine: Negative.  Negative for hot flashes.  Genitourinary:  Negative for bladder incontinence, difficulty urinating, dyspareunia, dysuria, frequency, hematuria, nocturia, pelvic pain and penile discharge.   Musculoskeletal:  Negative for arthralgias, back pain, flank pain, gait problem, myalgias, neck pain and neck stiffness.  Skin: Negative.  Negative for itching, rash and wound.  Neurological: Negative.  Negative for dizziness, extremity weakness, gait problem, headaches, light-headedness, numbness, seizures and speech difficulty.  Hematological: Negative.  Negative for adenopathy. Does not bruise/bleed easily.  Psychiatric/Behavioral: Negative.  Negative for confusion, decreased concentration, depression, sleep disturbance and suicidal ideas. The patient is not nervous/anxious.      VITALS:  There were no vitals taken for this visit.  Wt Readings from Last 3 Encounters:  01/28/24 255 lb 9.6 oz (115.9 kg)  11/05/23 258 lb 3.2 oz (117.1 kg)  09/10/23 256 lb 8 oz (116.3 kg)    There is no height or weight on  file to calculate BMI.  Performance status (ECOG): 0 - Asymptomatic  PHYSICAL EXAM:  Deferred for telehealth visit. Physical Exam Vitals and nursing note reviewed.  Constitutional:      General: He is not in acute distress.    Appearance: Normal appearance. He is normal weight. He is not ill-appearing, toxic-appearing or diaphoretic.  HENT:     Head: Normocephalic and atraumatic.     Right Ear: Tympanic membrane, ear canal and external ear normal. There is no impacted cerumen.     Left Ear: Tympanic membrane, ear canal and external ear normal. There is no impacted cerumen.     Nose: Nose normal. No congestion or rhinorrhea.     Mouth/Throat:     Mouth: Mucous membranes are moist.     Pharynx: Oropharynx is clear. No oropharyngeal exudate or posterior oropharyngeal erythema.  Eyes:     General: No scleral icterus.       Right eye: No discharge.        Left eye: No discharge.     Extraocular Movements: Extraocular movements intact.     Conjunctiva/sclera: Conjunctivae normal.     Pupils: Pupils are equal, round, and reactive to light.  Neck:     Vascular: No carotid bruit.  Cardiovascular:     Rate  and Rhythm: Normal rate and regular rhythm.     Pulses: Normal pulses.     Heart sounds: Normal heart sounds. No murmur heard.    No friction rub. No gallop.  Pulmonary:     Effort: Pulmonary effort is normal. No respiratory distress.     Breath sounds: Normal breath sounds. No stridor. No wheezing, rhonchi or rales.  Chest:     Chest wall: No tenderness.  Abdominal:     General: Bowel sounds are normal. There is no distension.     Palpations: Abdomen is soft. There is no hepatomegaly, splenomegaly or mass.     Tenderness: There is no abdominal tenderness. There is no right CVA tenderness, left CVA tenderness, guarding or rebound.     Hernia: No hernia is present.     Comments: Bottom of his liver is just below the right costal margin  Musculoskeletal:        General: No  swelling, tenderness, deformity or signs of injury. Normal range of motion.     Cervical back: Normal range of motion and neck supple. No rigidity or tenderness.     Right lower leg: No edema.     Left lower leg: No edema.  Lymphadenopathy:     Cervical: No cervical adenopathy.     Upper Body:     Right upper body: No supraclavicular or axillary adenopathy.     Left upper body: No supraclavicular or axillary adenopathy.     Lower Body: No right inguinal adenopathy. No left inguinal adenopathy.  Skin:    General: Skin is warm and dry.     Coloration: Skin is not jaundiced or pale.     Findings: No bruising, erythema, lesion or rash.  Neurological:     General: No focal deficit present.     Mental Status: He is alert and oriented to person, place, and time. Mental status is at baseline.     Cranial Nerves: No cranial nerve deficit.     Sensory: No sensory deficit.     Motor: No weakness.     Coordination: Coordination normal.     Gait: Gait normal.     Deep Tendon Reflexes: Reflexes normal.  Psychiatric:        Mood and Affect: Mood normal.        Behavior: Behavior normal.        Thought Content: Thought content normal.        Judgment: Judgment normal.     LABS:      Latest Ref Rng & Units 01/22/2024    1:12 PM 11/05/2023    1:02 PM 08/13/2023    9:31 AM  CBC  WBC 4.0 - 10.5 K/uL 5.8  5.3  6.6   Hemoglobin 13.0 - 17.0 g/dL 85.8  85.8  84.7   Hematocrit 39.0 - 52.0 % 41.2  42.1  45.2   Platelets 150 - 400 K/uL 126  111  139       Latest Ref Rng & Units 01/22/2024    1:12 PM 11/05/2023    1:02 PM 08/13/2023    9:31 AM  CMP  Glucose 70 - 99 mg/dL 878  869  863   BUN 8 - 23 mg/dL 27  18  17    Creatinine 0.61 - 1.24 mg/dL 7.72  8.25  8.20   Sodium 135 - 145 mmol/L 140  140  139   Potassium 3.5 - 5.1 mmol/L 4.6  4.4  4.2   Chloride 98 - 111  mmol/L 108  109  108   CO2 22 - 32 mmol/L 20  21  21    Calcium 8.9 - 10.3 mg/dL 8.4  8.8  8.9   Total Protein 6.5 - 8.1 g/dL 6.3  6.2   6.5   Total Bilirubin 0.0 - 1.2 mg/dL 0.7  0.5  0.6   Alkaline Phos 38 - 126 U/L 57  60  56   AST 15 - 41 U/L 33  34  41   ALT 0 - 44 U/L 41  58  60      No results found for: CEA1, CEA / No results found for: CEA1, CEA No results found for: PSA1 No results found for: CAN199 No results found for: CAN125  No results found for: TOTALPROTELP, ALBUMINELP, A1GS, A2GS, BETS, BETA2SER, GAMS, MSPIKE, SPEI No results found for: TIBC, FERRITIN, IRONPCTSAT No results found for: LDH  STUDIES:  EXAM: 02/17/2024 CT ABDOMEN PELVIS WO CONTRAST  IMPRESSION:  1. Recurrent small bowel obstruction from known metastatic carcinoid  tumor to mesentery in the right mid abdomen. No perforation.  2. Hepatic metastases are not appreciably changed from comparison study  dated 01/17/2022.  3. Hepatosplenomegaly. Trace perisplenic ascites.  4. Bilateral nonobstructing nephrolithiasis.   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)    Malignant nonargentaffin carcinoid tumor (HCC)    Malignant nonargentaffin carcinoid tumor (HCC)    Secondary malignant carcinoid tumor of liver (HCC)    Thrombocytopenia 03/24/2023    Past Surgical History:  Procedure Laterality Date   CYSTOTOMY  01/2020   IR URETERAL STENT LEFT NEW ACCESS W/O SEP NEPHROSTOMY CATH  06/13/2020   IR URETERAL STENT PLACEMENT EXISTING ACCESS LEFT  06/15/2020   LYMPH NODE BIOPSY Left    supraclavicular   NEPHROLITHOTOMY Left 06/13/2020   Procedure: NEPHROLITHOTOMY PERCUTANEOUS;  Surgeon: Elisabeth Valli BIRCH, MD;  Location: [facility];  Service: Urology;  Laterality: Left;  2 HRS   ORCHIECTOMY  1996   UMBILICAL HERNIA REPAIR      Family History  Problem Relation Age of Onset   Cancer Mother        jaw bone    Throat cancer Father        smoker    Social History:  reports that he quit smoking about 28 years ago. His smoking use included cigarettes. He started smoking about 47 years ago. He has a 4.8 pack-year smoking history. He has never used smokeless tobacco. He reports current alcohol use. He reports that he does not use drugs.The patient is alone today.  Allergies: No Known Allergies  Current Medications: Current Outpatient Medications  Medication Sig Dispense Refill   acetaminophen  (TYLENOL ) 500 MG tablet Take 500 mg by mouth.     celecoxib (CELEBREX) 200 MG capsule Take 200 mg by mouth 2 (two) times daily.     octreotide  (SANDOSTATIN  LAR) 20 MG injection Inject 20 mg into the muscle every 28 (twenty-eight) days.     tamsulosin (FLOMAX) 0.4 MG CAPS capsule SMARTSIG:1 Capsule(s) By Mouth Every Evening     No current facility-administered medications for this visit.    I,Tery Hoeger H Stephen Turnbaugh,acting as a scribe for Wanda VEAR Cornish, MD.,have documented all relevant documentation on the behalf of Wanda VEAR Cornish,  MD,as directed by  Wanda VEAR Cornish, MD while in the presence of Wanda VEAR Cornish, MD.  I have reviewed this report as typed by the medical scribe, and it is complete and accurate.

## 2024-03-09 ENCOUNTER — Encounter: Payer: Self-pay | Admitting: Oncology

## 2024-03-24 ENCOUNTER — Inpatient Hospital Stay: Attending: Oncology

## 2024-03-24 VITALS — BP 132/80 | HR 58 | Temp 98.1°F | Resp 18

## 2024-03-24 DIAGNOSIS — C7B02 Secondary carcinoid tumors of liver: Secondary | ICD-10-CM | POA: Insufficient documentation

## 2024-03-24 DIAGNOSIS — C7B8 Other secondary neuroendocrine tumors: Secondary | ICD-10-CM

## 2024-03-24 DIAGNOSIS — E538 Deficiency of other specified B group vitamins: Secondary | ICD-10-CM | POA: Diagnosis not present

## 2024-03-24 DIAGNOSIS — C7A00 Malignant carcinoid tumor of unspecified site: Secondary | ICD-10-CM | POA: Diagnosis not present

## 2024-03-24 MED ORDER — CYANOCOBALAMIN 1000 MCG/ML IJ SOLN
1000.0000 ug | Freq: Once | INTRAMUSCULAR | Status: AC
  Administered 2024-03-24: 1000 ug via INTRAMUSCULAR
  Filled 2024-03-24: qty 1

## 2024-03-24 MED ORDER — OCTREOTIDE ACETATE 30 MG IM KIT
30.0000 mg | PACK | Freq: Once | INTRAMUSCULAR | Status: AC
  Administered 2024-03-24: 30 mg via INTRAMUSCULAR
  Filled 2024-03-24: qty 1

## 2024-03-24 NOTE — Patient Instructions (Signed)
 Octreotide Injection Solution What is this medication? OCTREOTIDE (ok TREE oh tide) treats high levels of growth hormone (acromegaly). It works by reducing the amount of growth hormone your body makes. This reduces symptoms and the risk of health problems caused by too much growth hormone, such as diabetes and heart disease. It may also be used to treat diarrhea caused by neuroendocrine tumors. It works by slowing down the release of serotonin from the tumor cells. This reduces the number of bowel movements you have. This medicine may be used for other purposes; ask your health care provider or pharmacist if you have questions. COMMON BRAND NAME(S): Berline Lopes, Sandostatin What should I tell my care team before I take this medication? They need to know if you have any of these conditions: Diabetes Gallbladder disease Heart disease Kidney disease Liver disease Pancreatic disease Thyroid disease An unusual or allergic reaction to octreotide, other medications, foods, dyes, or preservatives Pregnant or trying to get pregnant Breastfeeding How should I use this medication? This medication is injected under the skin or into a vein. It is usually given by your care team in a hospital or clinic setting. If you get this medication at home, you will be taught how to prepare and give it. Use exactly as directed. Take it as directed on the prescription label at the same time every day. Keep taking it unless your care team tells you to stop. Allow the injection solution to come to room temperature before use. Do not warm it artificially. It is important that you put your used needles and syringes in a special sharps container. Do not put them in a trash can. If you do not have a sharps container, call your pharmacist or care team to get one. Talk to your care team about the use of this medication in children. Special care may be needed. Overdosage: If you think you have taken too much of this medicine  contact a poison control center or emergency room at once. NOTE: This medicine is only for you. Do not share this medicine with others. What if I miss a dose? If you miss a dose, take it as soon as you can. If it is almost time for your next dose, take only that dose. Do not take double or extra doses. What may interact with this medication? Bromocriptine Certain medications for blood pressure, heart disease, irregular heartbeat Cyclosporine Diuretics Medications for diabetes, including insulin Quinidine This list may not describe all possible interactions. Give your health care provider a list of all the medicines, herbs, non-prescription drugs, or dietary supplements you use. Also tell them if you smoke, drink alcohol, or use illegal drugs. Some items may interact with your medicine. What should I watch for while using this medication? Visit your care team for regular checks on your progress. Tell your care team if your symptoms do not start to get better or if they get worse. To help reduce irritation at the injection site, use a different site for each injection and make sure the solution is at room temperature before use. This medication may cause decreases in blood sugar. Signs of low blood sugar include chills, cool, pale skin or cold sweats, drowsiness, extreme hunger, fast heartbeat, headache, nausea, nervousness or anxiety, shakiness, trembling, unsteadiness, tiredness, or weakness. Contact your care team right away if you experience any of these symptoms. This medication may increase blood sugar. The risk may be higher in patients who already have diabetes. Ask your care team what you can  do to lower your risk of diabetes while taking this medication. You should make sure you get enough vitamin B12 while you are taking this medication. Discuss the foods you eat and the vitamins you take with your care team. What side effects may I notice from receiving this medication? Side effects that  you should report to your care team as soon as possible: Allergic reactions--skin rash, itching, hives, swelling of the face, lips, tongue, or throat Gallbladder problems--severe stomach pain, nausea, vomiting, fever Heart rhythm changes--fast or irregular heartbeat, dizziness, feeling faint or lightheaded, chest pain, trouble breathing High blood sugar (hyperglycemia)--increased thirst or amount of urine, unusual weakness or fatigue, blurry vision Low blood sugar (hypoglycemia)--tremors or shaking, anxiety, sweating, cold or clammy skin, confusion, dizziness, rapid heartbeat Low thyroid levels (hypothyroidism)--unusual weakness or fatigue, increased sensitivity to cold, constipation, hair loss, dry skin, weight gain, feelings of depression Low vitamin B12 level--pain, tingling, or numbness in the hands or feet, muscle weakness, dizziness, confusion, trouble concentrating Oily or light-colored stools, diarrhea, bloating, weight loss Pancreatitis--severe stomach pain that spreads to your back or gets worse after eating or when touched, fever, nausea, vomiting Slow heartbeat--dizziness, feeling faint or lightheaded, confusion, trouble breathing, unusual weakness or fatigue Side effects that usually do not require medical attention (report these to your care team if they continue or are bothersome): Diarrhea Dizziness Headache Nausea Pain, redness, or irritation at injection site Stomach pain This list may not describe all possible side effects. Call your doctor for medical advice about side effects. You may report side effects to FDA at 1-800-FDA-1088. Where should I keep my medication? Keep out of the reach of children and pets. Store in the refrigerator. Protect from light. Allow to come to room temperature naturally. Do not use artificial heat. If protected from light, the injection may be stored between 20 and 30 degrees C (70 and 86 degrees F) for 14 days. After the initial use, throw away  any unused portion of a multiple dose vial after 14 days. Get rid of any unused portions of the ampules after use. To get rid of medications that are no longer needed or have expired: Take the medication to a medication take-back program. Ask your pharmacy or law enforcement to find a location. If you cannot return the medication, ask your pharmacist or care team how to get rid of the medication safely. NOTE: This sheet is a summary. It may not cover all possible information. If you have questions about this medicine, talk to your doctor, pharmacist, or health care provider.  2024 [employer] (2023-03-28 00:00:00)

## 2024-04-21 ENCOUNTER — Other Ambulatory Visit: Payer: Self-pay | Admitting: Oncology

## 2024-04-21 ENCOUNTER — Inpatient Hospital Stay

## 2024-04-21 ENCOUNTER — Encounter: Payer: Self-pay | Admitting: Oncology

## 2024-04-21 ENCOUNTER — Telehealth: Payer: Self-pay | Admitting: Oncology

## 2024-04-21 ENCOUNTER — Inpatient Hospital Stay: Attending: Oncology | Admitting: Oncology

## 2024-04-21 VITALS — BP 149/90 | HR 61 | Temp 97.9°F | Resp 18 | Ht 70.0 in | Wt 252.1 lb

## 2024-04-21 VITALS — BP 155/89 | HR 61 | Temp 97.9°F | Resp 18

## 2024-04-21 DIAGNOSIS — C7A00 Malignant carcinoid tumor of unspecified site: Secondary | ICD-10-CM

## 2024-04-21 DIAGNOSIS — E538 Deficiency of other specified B group vitamins: Secondary | ICD-10-CM | POA: Diagnosis present

## 2024-04-21 DIAGNOSIS — C7B8 Other secondary neuroendocrine tumors: Secondary | ICD-10-CM

## 2024-04-21 DIAGNOSIS — C7B02 Secondary carcinoid tumors of liver: Secondary | ICD-10-CM | POA: Insufficient documentation

## 2024-04-21 DIAGNOSIS — Z79899 Other long term (current) drug therapy: Secondary | ICD-10-CM | POA: Insufficient documentation

## 2024-04-21 LAB — CBC WITH DIFFERENTIAL (CANCER CENTER ONLY)
Abs Immature Granulocytes: 0.04 K/uL (ref 0.00–0.07)
Basophils Absolute: 0.1 K/uL (ref 0.0–0.1)
Basophils Relative: 1 %
Eosinophils Absolute: 0.1 K/uL (ref 0.0–0.5)
Eosinophils Relative: 2 %
HCT: 42.6 % (ref 39.0–52.0)
Hemoglobin: 14.2 g/dL (ref 13.0–17.0)
Immature Granulocytes: 1 %
Lymphocytes Relative: 15 %
Lymphs Abs: 0.9 K/uL (ref 0.7–4.0)
MCH: 30 pg (ref 26.0–34.0)
MCHC: 33.3 g/dL (ref 30.0–36.0)
MCV: 89.9 fL (ref 80.0–100.0)
Monocytes Absolute: 0.4 K/uL (ref 0.1–1.0)
Monocytes Relative: 7 %
Neutro Abs: 4.4 K/uL (ref 1.7–7.7)
Neutrophils Relative %: 74 %
Platelet Count: 118 K/uL — ABNORMAL LOW (ref 150–400)
RBC: 4.74 MIL/uL (ref 4.22–5.81)
RDW: 13.7 % (ref 11.5–15.5)
WBC Count: 6 K/uL (ref 4.0–10.5)
nRBC: 0 % (ref 0.0–0.2)

## 2024-04-21 LAB — CMP (CANCER CENTER ONLY)
ALT: 43 U/L (ref 0–44)
AST: 33 U/L (ref 15–41)
Albumin: 4.3 g/dL (ref 3.5–5.0)
Alkaline Phosphatase: 56 U/L (ref 38–126)
Anion gap: 10 (ref 5–15)
BUN: 19 mg/dL (ref 8–23)
CO2: 22 mmol/L (ref 22–32)
Calcium: 8.9 mg/dL (ref 8.9–10.3)
Chloride: 107 mmol/L (ref 98–111)
Creatinine: 1.83 mg/dL — ABNORMAL HIGH (ref 0.61–1.24)
GFR, Estimated: 40 mL/min — ABNORMAL LOW
Glucose, Bld: 170 mg/dL — ABNORMAL HIGH (ref 70–99)
Potassium: 4 mmol/L (ref 3.5–5.1)
Sodium: 139 mmol/L (ref 135–145)
Total Bilirubin: 0.6 mg/dL (ref 0.0–1.2)
Total Protein: 6 g/dL — ABNORMAL LOW (ref 6.5–8.1)

## 2024-04-21 MED ORDER — CYANOCOBALAMIN 1000 MCG/ML IJ SOLN
1000.0000 ug | Freq: Once | INTRAMUSCULAR | Status: AC
  Administered 2024-04-21: 1000 ug via INTRAMUSCULAR
  Filled 2024-04-21: qty 1

## 2024-04-21 MED ORDER — OCTREOTIDE ACETATE 30 MG IM KIT
30.0000 mg | PACK | Freq: Once | INTRAMUSCULAR | Status: AC
  Administered 2024-04-21: 30 mg via INTRAMUSCULAR
  Filled 2024-04-21: qty 1

## 2024-04-21 NOTE — Patient Instructions (Signed)
 Octreotide Injection Solution What is this medication? OCTREOTIDE (ok TREE oh tide) treats high levels of growth hormone (acromegaly). It works by reducing the amount of growth hormone your body makes. This reduces symptoms and the risk of health problems caused by too much growth hormone, such as diabetes and heart disease. It may also be used to treat diarrhea caused by neuroendocrine tumors. It works by slowing down the release of serotonin from the tumor cells. This reduces the number of bowel movements you have. This medicine may be used for other purposes; ask your health care provider or pharmacist if you have questions. COMMON BRAND NAME(S): Berline Lopes, Sandostatin What should I tell my care team before I take this medication? They need to know if you have any of these conditions: Diabetes Gallbladder disease Heart disease Kidney disease Liver disease Pancreatic disease Thyroid disease An unusual or allergic reaction to octreotide, other medications, foods, dyes, or preservatives Pregnant or trying to get pregnant Breastfeeding How should I use this medication? This medication is injected under the skin or into a vein. It is usually given by your care team in a hospital or clinic setting. If you get this medication at home, you will be taught how to prepare and give it. Use exactly as directed. Take it as directed on the prescription label at the same time every day. Keep taking it unless your care team tells you to stop. Allow the injection solution to come to room temperature before use. Do not warm it artificially. It is important that you put your used needles and syringes in a special sharps container. Do not put them in a trash can. If you do not have a sharps container, call your pharmacist or care team to get one. Talk to your care team about the use of this medication in children. Special care may be needed. Overdosage: If you think you have taken too much of this medicine  contact a poison control center or emergency room at once. NOTE: This medicine is only for you. Do not share this medicine with others. What if I miss a dose? If you miss a dose, take it as soon as you can. If it is almost time for your next dose, take only that dose. Do not take double or extra doses. What may interact with this medication? Bromocriptine Certain medications for blood pressure, heart disease, irregular heartbeat Cyclosporine Diuretics Medications for diabetes, including insulin Quinidine This list may not describe all possible interactions. Give your health care provider a list of all the medicines, herbs, non-prescription drugs, or dietary supplements you use. Also tell them if you smoke, drink alcohol, or use illegal drugs. Some items may interact with your medicine. What should I watch for while using this medication? Visit your care team for regular checks on your progress. Tell your care team if your symptoms do not start to get better or if they get worse. To help reduce irritation at the injection site, use a different site for each injection and make sure the solution is at room temperature before use. This medication may cause decreases in blood sugar. Signs of low blood sugar include chills, cool, pale skin or cold sweats, drowsiness, extreme hunger, fast heartbeat, headache, nausea, nervousness or anxiety, shakiness, trembling, unsteadiness, tiredness, or weakness. Contact your care team right away if you experience any of these symptoms. This medication may increase blood sugar. The risk may be higher in patients who already have diabetes. Ask your care team what you can  do to lower your risk of diabetes while taking this medication. You should make sure you get enough vitamin B12 while you are taking this medication. Discuss the foods you eat and the vitamins you take with your care team. What side effects may I notice from receiving this medication? Side effects that  you should report to your care team as soon as possible: Allergic reactions--skin rash, itching, hives, swelling of the face, lips, tongue, or throat Gallbladder problems--severe stomach pain, nausea, vomiting, fever Heart rhythm changes--fast or irregular heartbeat, dizziness, feeling faint or lightheaded, chest pain, trouble breathing High blood sugar (hyperglycemia)--increased thirst or amount of urine, unusual weakness or fatigue, blurry vision Low blood sugar (hypoglycemia)--tremors or shaking, anxiety, sweating, cold or clammy skin, confusion, dizziness, rapid heartbeat Low thyroid levels (hypothyroidism)--unusual weakness or fatigue, increased sensitivity to cold, constipation, hair loss, dry skin, weight gain, feelings of depression Low vitamin B12 level--pain, tingling, or numbness in the hands or feet, muscle weakness, dizziness, confusion, trouble concentrating Oily or light-colored stools, diarrhea, bloating, weight loss Pancreatitis--severe stomach pain that spreads to your back or gets worse after eating or when touched, fever, nausea, vomiting Slow heartbeat--dizziness, feeling faint or lightheaded, confusion, trouble breathing, unusual weakness or fatigue Side effects that usually do not require medical attention (report these to your care team if they continue or are bothersome): Diarrhea Dizziness Headache Nausea Pain, redness, or irritation at injection site Stomach pain This list may not describe all possible side effects. Call your doctor for medical advice about side effects. You may report side effects to FDA at 1-800-FDA-1088. Where should I keep my medication? Keep out of the reach of children and pets. Store in the refrigerator. Protect from light. Allow to come to room temperature naturally. Do not use artificial heat. If protected from light, the injection may be stored between 20 and 30 degrees C (70 and 86 degrees F) for 14 days. After the initial use, throw away  any unused portion of a multiple dose vial after 14 days. Get rid of any unused portions of the ampules after use. To get rid of medications that are no longer needed or have expired: Take the medication to a medication take-back program. Ask your pharmacy or law enforcement to find a location. If you cannot return the medication, ask your pharmacist or care team how to get rid of the medication safely. NOTE: This sheet is a summary. It may not cover all possible information. If you have questions about this medicine, talk to your doctor, pharmacist, or health care provider.  2024 [employer] (2023-03-28 00:00:00)

## 2024-04-21 NOTE — Telephone Encounter (Signed)
 Patient has been scheduled for follow-up visit per 04/21/2024 LOS.  Pt given an appt calendar with date and time.

## 2024-04-21 NOTE — Progress Notes (Signed)
 " [facility]  [address] [place],  [place]  [address] [phone]  Clinic Day: 04/21/24  Referring physician: Allen, Chad, NP  ASSESSMENT & PLAN:  Assessment: Recurrent small bowel obstruction The patient describes having abdominal pain for 2 days, severe and associated with nausea and vomiting. He was admitted to [facility] on 02/16/2024 for a small bowel obstruction. CT abdomen pelvis on 02/17/2024 revealed recurrent small bowel obstruction from known metastatic carcinoid tumor to mesentery in the right mid abdomen with no perforation, hepatic metastases not appreciably changed from comparison study on 01/18/2024, hepatosplenomegaly with trace perisplenic ascites, and bilateral nonobstructing nephrolithiases. He was admitted for 2 days and treated conservatively. This had occurred once before approximately 2-3 years ago, but we discussed that it will likely occur again. He will have a consultation with a general surgeon on February 5th, 2026 to discuss possible resection.   Malignant carcinoid tumor of unknown primary site [facility]) Metastatic carcinoid tumor consistent with gastrointestinal origin in September 2021.  He has a terminal ileum mass measuring 4.0 x 2.8 x 3.7 cm, and a mass of the mesentery of the small intestine measuring 3.9 x 2.3 x 4.0 cm.  As this has already metastasized to the liver, surgical resection was not indicated.  24 hour urine for 5 HIAA was quite elevated at 86.8 consistent with carcinoid syndrome.  Chromogranin A was also elevated at 214. He started treatment with octreotide  injections every 4 weeks in October 2021.  Due to the persistent diarrhea, we increased the octreotide  to 30 mg every 4 weeks.  Over time, his disease has been stable to decreased on repeat CT imaging.  The chromogranin A, has remained elevated, but has not progressively increased.  Chromogranin A in March 2025 was 143.2, and it continues to slowly decrease to 139.2  in July, 2025. CT abdomen and pelvis done on 01/22/2024 that revealed the numerous hepatic metastases are stable, two calcified mesenteric masses  consistent with his known carcinoid tumor. There are peripheral mesenteric lymph nodes and spiculation consistent with fibrotic reactions surrounding the larger mass and an adjacent small bowel loop that is slightly dilated at the same region in the right mid abdomen possibly secondary to mild stenotic changes along the mass. There was no discrete bowel obstruction, multiple peritoneal nodules along the peritoneal reflections of the pelvic cavity, and a short-segment focal thickening of terminal ileum with questionable hypodense mural lesion. He has done very well for 4 years with stable scans and minimal symptoms. We will continue with the current therapy of monthly Sandostatin .     Secondary neuroendocrine tumor of liver (HCC) Multiple liver masses, the largest measuring 4.0 x 4.3 cm, consistent with metastatic carcinoid with carcinoid syndrome.  His disease remains stable to improved on CT imaging in April.  He will continue octreotide  every 4 weeks.   B12 deficiency anemia B12 deficiency diagnosed in November 2024. He received a protracted course of B12 injections and continues B12 monthly.   Thrombocytopenia (HCC) Mild chronic thrombocytopenia, which had worsened. He continues B12 injections monthly. Folate in November 2024 was normal and repeat in July, 2025 remains normal. His latest platelet count was up to 118,000.    Plan: The patient had been admitted to [facility] 2-3 years ago with a bowel obstruction caused by his mesenteric mass that resolved on its own. He was admitted again to [facility] on 02/16/2024 for recurrent small bowel obstruction from known metastatic  carcinoid tumor to mesentery in the right mid abdomen. This was treated conservatively and he is now scheduled for consultation with a general  surgeon on 06/04/2023 to discuss mass resection to avoid future episodes of bowel obstruction. He has a WBC of 6.0, hemoglobin of 14.2, and chronic low platelet count of 118,000 down from 126,000. His CMP is normal other than an elevated creatinine of 1.83 improved from 2.27 and low total protein of 6.0 down from 6.3. His chromogranin A level is pending and I will call him with the results. He received his Sandostatin  injection today which he will receive every 4 weeks. I will see him back in 12 weeks with CBC and CMP. I asked him to let me know if surgery is scheduled so we can cover with octreotide .  I provided 10 minutes of telehealth time during this encounter and > 50% was spent counseling as documented under my assessment and plan.   No orders of the defined types were placed in this encounter.  Wanda VEAR Cornish, MD  [facility] OF [facility] [address] [place] [address] Dept: [phone] Dept Fax: [phone]    CHIEF COMPLAINT:  CC: Malignant carcinoid  Current Treatment: Octreotide  every 4 weeks  HISTORY OF PRESENT ILLNESS:  Glenn Bender is a 63 y.o. male with metastatic carcinoid to liver diagnosed in September 2021.  The patient presented with hematuria, felt to be due to a kidney stone.  CT abdomen and pelvis revealed multiple liver masses, the largest measuring 4.0 x 4.3 cm, a terminal ileum mass measuring 4.0 x 2.8 x 3.7 cm, and a mass of the mesentery of the small intestine measuring 3.9 x 2.3 x 4.0 cm.  Diagnostic colonoscopy with Dr. Allana in September revealed a large mucosal covered mass protruding through the ileocecal valve with peristalsis.  Biopsy was collected and surgical pathology was benign.  Liver biopsy revealed metastatic neuroendocrine tumor, grade 2, consistent with gastrointestinal primary. Synaptophysin and chromogranin-A immunostains were positive as well as CDX-2 supporting  gastrointestinal origin.  Ki67 was 6%.  He reports flushing of his face with alcohol and certain foods.  He also reported diarrhea.  24 hour urine for 5HIAA was also elevated at 86.8, which is consistent with carcinoid.  Chromogranin A was elevated at 214 in October and came down to 116.9 in December.  CT chest from November 2021 revealed left paratracheal adenopathy and pleural nodularity along the right hemidiaphragm measuring up to 12 mm, worrisome for metastatic disease. As he had metastatic disease, resection of his primary was not recommended. The patient was started on octreotide  20 mg IM monthly in October.  Octreotide  was subsequently increased to 30 mg monthly in December due to persistent diarrhea.   CT chest, abdomen and pelvis in February 2022 revealed stable to slightly decreased appearance of the carcinoid tumor, including the mesenteric lesion.  The liver lesions were stable with no new lesions. There was stable nodularity/lobularity along the right hemidiaphragm.  He has had a continued decrease in the Chromogranin A, which was 111.4 in February, and then 100.3 in March, which is within normal limits.  He had improvement in his diarrhea with the increase in octreotide . The chromogranin A was up slightly in May to 126. CT chest, abdomen and pelvis in August revealed no new or progressive metastatic disease.  Findings are suggestive of multifocal small bowel carcinoid with conglomerate mesenteric nodal metastases.  Widespread hyperenhancing liver  metastases are stable.  Stable clustered subpleural solid pulmonary nodules along the right hemidiaphragm in the basilar right lower lobe are compatible with pulmonary metastases.  There is new background diffuse hepatic steatosis. Chromogranin A was down to 107.5 in August.  The chromogranin A was 118 in November.   CT abdomen and pelvis in April 2023 revealed stable hepatic metastatic disease and stable hepatic steatosis.  There was a stable 3.6 cm  partially calcified mesenteric mass, consistent with carcinoid tumor.  No evidence of new or progressive metastatic disease.  Bilateral nephrolithiasis, a small left inguinal hernia and aortic atherosclerosis were also seen.  He was hospitalized in September 2023 at [facility] with a small bowel obstruction.  CT abdomen and pelvis at that time revealed small-bowel obstruction, with transition point in the mid to distal jejunum adjacent to the known mesenteric carcinoid tumor. There was a stable calcified central mesenteric mass consistent with carcinoid tumor, and numerous liver masses, consistent with metastatic carcinoid tumor. The index lesions are not appreciably changed since prior exam. Hepatic steatosis, a non-obstructing left renal calculus and aortic atherosclerosis were seen.  We have continued octreotide  30 mg every 4 weeks with good control of his diarrhea.  He had a follow-up x-ray for his left kidney stone in December, which was stable.     CT abdomen and pelvis in April 2024 revealed no significant change in the calcified retractile mass in the base of the mesentery, typical of carcinoid. No gross change in the previously demonstrated hepatic metastases, although evaluation is limited by the lack of intravenous contrast and interval improvement in background hepatic steatosis. Interval resolution of previously demonstrated small bowel distension. No evidence of progressive metastatic disease. Nonobstructing left renal calculi.  Aortic Atherosclerosis.  He reported passing kidney stone in June.  The chromogranin A has been fairly stable over the past year, running around 145, with a max of 176.  CT abdomen and pelvis in October 2024 revealed stable calcified mesenteric mass and shotty subcentimeter mesenteric lymph nodes, as well as diffuse hepatic metastasis and hepatic steatosis without significant change.  No new or progressive metastatic disease was seen within the abdomen or pelvis.  Chromogranin  A was 143 in March 2025.  CT abdomen and pelvis in April revealed numerous hepatic metastases again noted. Index lesions in the dome of the liver and left liver have decreased in size in the interval. Index lesion in the posterior right liver is stable to slightly decreased in size in the interval. Borderline lymphadenopathy in the central small bowel mesentery with irregular 3.9 x 3.5 cm calcified mesenteric mass in the mid abdomen, which appeared more prominent than on the previous exam.  There is a second calcified lesion in the left lower quadrant measuring 2.6 x 1.1 cm, apparently in the sigmoid mesocolon, was also felt to be larger, measuring 10 mm thickness, previously 7 mm thickness.  Multiple soft tissue nodules in the central pelvis along the peritoneal reflection, similar to prior and suspicious for metastatic involvement.  Similar soft tissue fullness in the terminal ileum with some mild distension of ileum proximal to this level raising the question of underlying component of stenosis. Stable staghorn calculus lower pole left kidney without hydronephrosis. Tiny nonobstructing stones in the lower pole right kidney.     Oncology History  Malignant carcinoid tumor of unknown primary site [facility])  02/11/2020 Initial Diagnosis   Malignant carcinoid tumor of unknown primary site [facility])     INTERVAL HISTORY:  This patient has carcinoid  metastatic to liver and has been stable on Sandostatin  injections for several years. Patient states that he feels well and has no complaints of pain. The patient had been admitted to [facility] 2-3 years ago with a bowel obstruction caused by his mesenteric mass that resolved on its own. He was admitted to [facility] on 02/16/2024 for recurrent small bowel obstruction from known metastatic carcinoid tumor to mesentery in the right mid abdomen. This was treated conservatively and he is now scheduled for consultation with a general  surgeon on 06/04/2023 to discuss mass resection to avoid future episodes of bowel obstruction. He has a WBC of 6.0, hemoglobin of 14.2, and chronic low platelet count of 118,000 down from 126,000. His CMP is normal other than an elevated creatinine of 1.83 improved from 2.27 and low total protein of 6.0 down from 6.3. His chromogranin A level is pending and I will call him with the results. He received his Sandostatin  injection today which he will receive every 4 weeks. I will see him back in 12 weeks with CBC and CMP. I asked him to let me know if surgery is scheduled so we can cover with octreotide . He denies fever, chills, night sweats, or other signs of infection. He denies cardiorespiratory and gastrointestinal issues. He  denies pain. His appetite is super good and His weight has decreased 3 pounds over last 2.5 months.   REVIEW OF SYSTEMS:  Review of Systems  Constitutional: Negative.  Negative for appetite change, chills, diaphoresis, fatigue, fever and unexpected weight change.  HENT:  Negative.  Negative for hearing loss, lump/mass, mouth sores, nosebleeds, sore throat, tinnitus, trouble swallowing and voice change.   Eyes: Negative.   Respiratory: Negative.  Negative for chest tightness, cough, hemoptysis, shortness of breath and wheezing.   Cardiovascular: Negative.  Negative for chest pain, leg swelling and palpitations.  Gastrointestinal: Negative.  Negative for abdominal distention, abdominal pain, blood in stool, constipation, diarrhea, nausea, rectal pain and vomiting.  Endocrine: Negative.  Negative for hot flashes.  Genitourinary:  Negative for bladder incontinence, difficulty urinating, dyspareunia, dysuria, frequency, hematuria, nocturia, pelvic pain and penile discharge.   Musculoskeletal:  Negative for arthralgias, back pain, flank pain, gait problem, myalgias, neck pain and neck stiffness.  Skin: Negative.  Negative for itching, rash and wound.  Neurological: Negative.  Negative  for dizziness, extremity weakness, gait problem, headaches, light-headedness, numbness, seizures and speech difficulty.  Hematological: Negative.  Negative for adenopathy. Does not bruise/bleed easily.  Psychiatric/Behavioral: Negative.  Negative for confusion, decreased concentration, depression, sleep disturbance and suicidal ideas. The patient is not nervous/anxious.     VITALS:  Blood pressure (!) 149/90, pulse 61, temperature 97.9 F (36.6 C), temperature source Oral, resp. rate 18, height 5' 10 (1.778 m), weight 252 lb 1.6 oz (114.4 kg), SpO2 99%.  Wt Readings from Last 3 Encounters:  04/21/24 252 lb 1.6 oz (114.4 kg)  01/28/24 255 lb 9.6 oz (115.9 kg)  11/05/23 258 lb 3.2 oz (117.1 kg)    Body mass index is 36.17 kg/m.  Performance status (ECOG): 0 - Asymptomatic  PHYSICAL EXAM:  Physical Exam Vitals and nursing note reviewed.  Constitutional:      General: He is not in acute distress.    Appearance: Normal appearance. He is normal weight. He is not ill-appearing, toxic-appearing or diaphoretic.  HENT:     Head: Normocephalic and atraumatic.     Right Ear: Tympanic membrane, ear canal and external ear normal. There  is no impacted cerumen.     Left Ear: Tympanic membrane, ear canal and external ear normal. There is no impacted cerumen.     Nose: Nose normal. No congestion or rhinorrhea.     Mouth/Throat:     Mouth: Mucous membranes are moist.     Pharynx: Oropharynx is clear. No oropharyngeal exudate or posterior oropharyngeal erythema.  Eyes:     General: No scleral icterus.       Right eye: No discharge.        Left eye: No discharge.     Extraocular Movements: Extraocular movements intact.     Conjunctiva/sclera: Conjunctivae normal.     Pupils: Pupils are equal, round, and reactive to light.  Neck:     Vascular: No carotid bruit.  Cardiovascular:     Rate and Rhythm: Normal rate and regular rhythm.     Pulses: Normal pulses.     Heart sounds: Normal heart sounds.  No murmur heard.    No friction rub. No gallop.  Pulmonary:     Effort: Pulmonary effort is normal. No respiratory distress.     Breath sounds: Normal breath sounds. No stridor. No wheezing, rhonchi or rales.  Chest:     Chest wall: No tenderness.  Abdominal:     General: Bowel sounds are normal. There is no distension.     Palpations: Abdomen is soft. There is no hepatomegaly, splenomegaly or mass.     Tenderness: There is no abdominal tenderness. There is no right CVA tenderness, left CVA tenderness, guarding or rebound.     Hernia: No hernia is present.     Comments: Bottom of his liver is just below the right costal margin  Musculoskeletal:        General: No swelling, tenderness, deformity or signs of injury. Normal range of motion.     Cervical back: Normal range of motion and neck supple. No rigidity or tenderness.     Right lower leg: No edema.     Left lower leg: No edema.  Lymphadenopathy:     Cervical: No cervical adenopathy.     Upper Body:     Right upper body: No supraclavicular or axillary adenopathy.     Left upper body: No supraclavicular or axillary adenopathy.     Lower Body: No right inguinal adenopathy. No left inguinal adenopathy.  Skin:    General: Skin is warm and dry.     Coloration: Skin is not jaundiced or pale.     Findings: No bruising, erythema, lesion or rash.  Neurological:     General: No focal deficit present.     Mental Status: He is alert and oriented to person, place, and time. Mental status is at baseline.     Cranial Nerves: No cranial nerve deficit.     Sensory: No sensory deficit.     Motor: No weakness.     Coordination: Coordination normal.     Gait: Gait normal.     Deep Tendon Reflexes: Reflexes normal.  Psychiatric:        Mood and Affect: Mood normal.        Behavior: Behavior normal.        Thought Content: Thought content normal.        Judgment: Judgment normal.    LABS:      Latest Ref Rng & Units 04/21/2024   10:45 AM  01/22/2024    1:12 PM 11/05/2023    1:02 PM  CBC  WBC 4.0 -  10.5 K/uL 6.0  5.8  5.3   Hemoglobin 13.0 - 17.0 g/dL 85.7  85.8  85.8   Hematocrit 39.0 - 52.0 % 42.6  41.2  42.1   Platelets 150 - 400 K/uL 118  126  111       Latest Ref Rng & Units 04/21/2024   10:45 AM 01/22/2024    1:12 PM 11/05/2023    1:02 PM  CMP  Glucose 70 - 99 mg/dL 829  878  869   BUN 8 - 23 mg/dL 19  27  18    Creatinine 0.61 - 1.24 mg/dL 8.16  7.72  8.25   Sodium 135 - 145 mmol/L 139  140  140   Potassium 3.5 - 5.1 mmol/L 4.0  4.6  4.4   Chloride 98 - 111 mmol/L 107  108  109   CO2 22 - 32 mmol/L 22  20  21    Calcium 8.9 - 10.3 mg/dL 8.9  8.4  8.8   Total Protein 6.5 - 8.1 g/dL 6.0  6.3  6.2   Total Bilirubin 0.0 - 1.2 mg/dL 0.6  0.7  0.5   Alkaline Phos 38 - 126 U/L 56  57  60   AST 15 - 41 U/L 33  33  34   ALT 0 - 44 U/L 43  41  58    No results found for: CEA1, CEA / No results found for: CEA1, CEA No results found for: PSA1 No results found for: CAN199 No results found for: CAN125  No results found for: TOTALPROTELP, ALBUMINELP, A1GS, A2GS, BETS, BETA2SER, GAMS, MSPIKE, SPEI No results found for: TIBC, FERRITIN, IRONPCTSAT No results found for: LDH  STUDIES:  EXAM: 01/22/2024 CT ABDOMEN AND PELVIS WITH CONTRAST IMPRESSION: 1. Numerous hepatic metastases are stable to prior. 2. Two calcified mesenteric masses as detailed above consistent with known carcinoid tumor. Peripheral Misty mesentery and spiculation consistent with fibrotic reactions surrounding the larger mass. An adjacent small bowel loop is slightly dilated at the same region in right mid abdomen possibly secondary to mild stenotic changes along the mass. No discrete bowel obstruction. 3. Multiple peritoneal nodules along the peritoneal reflections of the pelvic cavity, consistent with metastatic implants stable to prior. 4. Short-segment focal thickening of terminal ileum with questionable hypodense  mural lesion. Findings may represent inflammatory changes (such as Crohn's disease) versus malignancy.  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)    Malignant nonargentaffin carcinoid tumor (HCC)    Malignant nonargentaffin carcinoid tumor (HCC)    Secondary malignant carcinoid tumor of liver (HCC)    Thrombocytopenia 03/24/2023    Past Surgical History:  Procedure Laterality Date   CYSTOTOMY  01/2020   IR URETERAL STENT LEFT NEW ACCESS W/O SEP NEPHROSTOMY CATH  06/13/2020   IR URETERAL STENT PLACEMENT EXISTING ACCESS LEFT  06/15/2020   LYMPH NODE BIOPSY Left    supraclavicular   NEPHROLITHOTOMY Left 06/13/2020   Procedure: NEPHROLITHOTOMY PERCUTANEOUS;  Surgeon: Elisabeth Valli BIRCH, MD;  Location: [facility];  Service: Urology;  Laterality: Left;  2 HRS   ORCHIECTOMY  1996   UMBILICAL HERNIA REPAIR      Family History  Problem Relation Age of Onset   Cancer Mother  jaw bone   Throat cancer Father        smoker    Social History:  reports that he quit smoking about 29 years ago. His smoking use included cigarettes. He started smoking about 48 years ago. He has a 4.8 pack-year smoking history. He has never used smokeless tobacco. He reports current alcohol use. He reports that he does not use drugs.The patient is alone today.  Allergies: No Known Allergies  Current Medications: Current Outpatient Medications  Medication Sig Dispense Refill   acetaminophen  (TYLENOL ) 500 MG tablet Take 500 mg by mouth.     celecoxib (CELEBREX) 200 MG capsule Take 200 mg by mouth 2 (two) times daily.     octreotide  (SANDOSTATIN  LAR) 20 MG injection Inject 20 mg into the muscle every 28 (twenty-eight) days.     tamsulosin (FLOMAX) 0.4 MG CAPS capsule SMARTSIG:1 Capsule(s) By Mouth Every Evening      No current facility-administered medications for this visit.    I,Jaena Brocato H Terin Cragle,acting as a scribe for Wanda VEAR Cornish, MD.,have documented all relevant documentation on the behalf of Wanda VEAR Cornish, MD,as directed by  Wanda VEAR Cornish, MD while in the presence of Wanda VEAR Cornish, MD.  I have reviewed this report as typed by the medical scribe, and it is complete and accurate.   "

## 2024-04-22 LAB — CHROMOGRANIN A: Chromogranin A (ng/mL): 139.6 ng/mL — ABNORMAL HIGH (ref 0.0–101.8)

## 2024-04-30 ENCOUNTER — Encounter: Payer: Self-pay | Admitting: Oncology

## 2024-05-12 ENCOUNTER — Encounter: Payer: Self-pay | Admitting: Oncology

## 2024-05-19 ENCOUNTER — Inpatient Hospital Stay: Payer: Self-pay | Attending: Oncology

## 2024-05-19 VITALS — BP 139/94 | HR 56 | Temp 98.2°F | Resp 18 | Ht 70.0 in

## 2024-05-19 DIAGNOSIS — C7A00 Malignant carcinoid tumor of unspecified site: Secondary | ICD-10-CM | POA: Insufficient documentation

## 2024-05-19 DIAGNOSIS — E538 Deficiency of other specified B group vitamins: Secondary | ICD-10-CM | POA: Insufficient documentation

## 2024-05-19 DIAGNOSIS — C7B8 Other secondary neuroendocrine tumors: Secondary | ICD-10-CM

## 2024-05-19 DIAGNOSIS — C7B02 Secondary carcinoid tumors of liver: Secondary | ICD-10-CM | POA: Insufficient documentation

## 2024-05-19 MED ORDER — OCTREOTIDE ACETATE 30 MG IM KIT
30.0000 mg | PACK | Freq: Once | INTRAMUSCULAR | Status: AC
  Administered 2024-05-19: 30 mg via INTRAMUSCULAR
  Filled 2024-05-19: qty 1

## 2024-05-19 MED ORDER — CYANOCOBALAMIN 1000 MCG/ML IJ SOLN
1000.0000 ug | Freq: Once | INTRAMUSCULAR | Status: AC
  Administered 2024-05-19: 1000 ug via INTRAMUSCULAR
  Filled 2024-05-19: qty 1

## 2024-05-26 ENCOUNTER — Telehealth: Payer: Self-pay

## 2024-05-26 NOTE — Telephone Encounter (Signed)
 PAP in progress for Sandostatin  [place]

## 2024-06-16 ENCOUNTER — Inpatient Hospital Stay

## 2024-07-14 ENCOUNTER — Inpatient Hospital Stay

## 2024-07-14 ENCOUNTER — Inpatient Hospital Stay: Admitting: Oncology

## 2024-08-11 ENCOUNTER — Inpatient Hospital Stay

## 2024-09-08 ENCOUNTER — Inpatient Hospital Stay
